# Patient Record
Sex: Female | Born: 2000 | Race: Black or African American | Hispanic: No | Marital: Single | Smoking: Never smoker
Health system: Southern US, Community
[De-identification: ages and names within clinical notes are randomized; demographics above are authoritative.]

## PROBLEM LIST (undated history)

## (undated) ENCOUNTER — Inpatient Hospital Stay (HOSPITAL_COMMUNITY): Payer: Self-pay

## (undated) DIAGNOSIS — D696 Thrombocytopenia, unspecified: Secondary | ICD-10-CM

## (undated) DIAGNOSIS — Z789 Other specified health status: Secondary | ICD-10-CM

## (undated) DIAGNOSIS — D563 Thalassemia minor: Secondary | ICD-10-CM

## (undated) HISTORY — PX: FOREIGN BODY REMOVAL: SHX962

## (undated) HISTORY — DX: Thalassemia minor: D56.3

---
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
## 2018-07-19 ENCOUNTER — Ambulatory Visit (HOSPITAL_COMMUNITY)
Admission: EM | Admit: 2018-07-19 | Discharge: 2018-07-19 | Disposition: A | Discharge status: Home / Self Care | Payer: Self-pay | Attending: Family Medicine | Admitting: Family Medicine

## 2018-07-19 ENCOUNTER — Other Ambulatory Visit: Payer: Self-pay

## 2018-07-19 ENCOUNTER — Encounter (HOSPITAL_COMMUNITY): Payer: Self-pay

## 2018-07-19 DIAGNOSIS — N898 Other specified noninflammatory disorders of vagina: Secondary | ICD-10-CM

## 2018-07-19 LAB — POCT URINALYSIS DIP (DEVICE)
Bilirubin Urine: NEGATIVE
Glucose, UA: NEGATIVE mg/dL
Ketones, ur: NEGATIVE mg/dL
Nitrite: NEGATIVE
Protein, ur: 30 mg/dL — AB
Specific Gravity, Urine: >=1.03 (ref 1.005–1.030)
Urobilinogen, UA: 0.2 mg/dL (ref 0.0–1.0)
pH: 7 (ref 5.0–8.0)

## 2018-07-19 LAB — POCT PREGNANCY, URINE: Preg Test, Ur: NEGATIVE

## 2018-07-19 NOTE — ED Provider Notes (Signed)
[facility]    CSN: [id number] Arrival date & time: 07/19/18  1715     History   Chief Complaint Chief Complaint  Patient presents with  . Vaginal Discharge    HPI Ruth Solomon is a 18 y.o. female any further acute concern of vaginal malodor.  Patient states that she was 1 daily for cycle, cycle started yesterday, and that she has noticed malodor with this.  Patient denies abnormal bleeding pattern, pain, fever.  Patient denies burning/stinging with urination, urinary frequency, increased vaginal discharge, anogenital lesions, dyspareunia.  She does note history of gonorrhea, treated 4 years ago without sequela.  Patient is currently sexually active, not routinely using condoms.  Patient currently has Nexplanon in place: Due to replace this October.   History reviewed. No pertinent past medical history.  There are no active problems to display for this patient.   Past Surgical History:  Procedure Laterality Date  . FOREIGN BODY REMOVAL Left    sewing needle removed from big toe    OB History   No obstetric history on file.      Home Medications    Prior to Admission medications   Not on File    Family History Family History  Problem Relation Age of Onset  . Healthy Mother   . Healthy Father     Social History Social History   Tobacco Use  . Smoking status: Never Smoker  . Smokeless tobacco: Never Used  Substance Use Topics  . Alcohol use: Never    Frequency: Never  . Drug use: Yes    Types: Marijuana     Allergies   Patient has no known allergies.   Review of Systems Review of Systems as per HPI   Physical Exam Triage Vital Signs ED Triage Vitals  Enc Vitals Group     BP 07/19/18 1755 (!) 113/57     Pulse Rate 07/19/18 1755 67     Resp 07/19/18 1755 18     Temp 07/19/18 1755 97.9 F (36.6 C)     Temp Source 07/19/18 1755 Oral     SpO2 07/19/18 1755 100 %     Weight --      Height --      Head Circumference --    Peak Flow --      Pain Score 07/19/18 1750 0     Pain Loc --      Pain Edu? --      Excl. in GC? --    No data found.  Updated Vital Signs BP (!) 113/57 (BP Location: Left Arm)   Pulse 67   Temp 97.9 F (36.6 C) (Oral)   Resp 18   SpO2 100%   Visual Acuity Right Eye Distance:   Left Eye Distance:   Bilateral Distance:    Right Eye Near:   Left Eye Near:    Bilateral Near:     Physical Exam Constitutional:      General: She is not in acute distress.    Appearance: Normal appearance. She is normal weight.  HENT:     Head: Normocephalic and atraumatic.  Eyes:     Conjunctiva/sclera: Conjunctivae normal.     Pupils: Pupils are equal, round, and reactive to light.  Neck:     Musculoskeletal: Neck supple.  Cardiovascular:     Rate and Rhythm: Normal rate and regular rhythm.     Heart sounds: No murmur.  Pulmonary:     Effort: Pulmonary effort is normal.  No respiratory distress.     Breath sounds: Normal breath sounds. No wheezing.  Abdominal:     General: Abdomen is flat. Bowel sounds are normal. There is no distension.     Palpations: Abdomen is soft. There is no mass.     Tenderness: There is no abdominal tenderness. There is no right CVA tenderness, left CVA tenderness or guarding.  Genitourinary:    Comments: Patient declined Lymphadenopathy:     Cervical: No cervical adenopathy.  Skin:    General: Skin is warm.     Capillary Refill: Capillary refill takes less than 2 seconds.     Coloration: Skin is not jaundiced or pale.  Neurological:     General: No focal deficit present.     Mental Status: She is alert and oriented to person, place, and time.  Psychiatric:        Mood and Affect: Mood normal.        Thought Content: Thought content normal.      UC Treatments / Results  Labs (all labs ordered are listed, but only abnormal results are displayed) Labs Reviewed  POCT URINALYSIS DIP (DEVICE) - Abnormal; Notable for the following components:       Result Value   Hgb urine dipstick LARGE (*)    Protein, ur 30 (*)    Leukocytes,Ua SMALL (*)    All other components within normal limits  POC URINE PREG, ED  POCT PREGNANCY, URINE  CERVICOVAGINAL ANCILLARY ONLY    EKG None  Radiology No results found.  Procedures Procedures (including critical care time)  Medications Ordered in UC Medications - No data to display  Initial Impression / Assessment and Plan / UC Course  I have reviewed the triage vital signs and the nursing notes.  Pertinent labs & imaging results that were available during my care of the patient were reviewed by me and considered in my medical decision making (see chart for details).     18 year old presenting for acute concern of vaginal malodor X 1 day.  Patient denies history of BV, increase in vaginal discharge.  She states that her baseline.  Urine and exam unremarkable.  We will look for results of swab, and treat if indicated. Final Clinical Impressions(s) / UC Diagnoses   Final diagnoses:  Vaginal discharge     Discharge Instructions     Will wait for swab result before treating since you are not having significant/bothersome discharge. Stop using new body wash as this could be contributing. Follow up w/ your GYN in October for your Nexplanon removal/placement and wear condoms regularly to prevent STIs.    ED Prescriptions    None     Controlled Substance Prescriptions  Controlled Substance Registry consulted? Not Applicable   Shea EvansHall-Potvin, Brittany, [place]-C 07/19/18 1901

## 2018-07-19 NOTE — ED Triage Notes (Signed)
Patient presents to Urgent Care with complaints of odorous vaginal bleeding with her period since yesterday. Patient reports she has had a nexplanon for three years, menstrual bleeding has never had an odor like this in the past, pt admits to unprotected intercourse.

## 2018-07-19 NOTE — Discharge Instructions (Addendum)
Will wait for swab result before treating since you are not having significant/bothersome discharge. Stop using new body wash as this could be contributing. Follow up w/ your GYN in October for your Nexplanon removal/placement and wear condoms regularly to prevent STIs.

## 2018-07-20 LAB — CERVICOVAGINAL ANCILLARY ONLY
Bacterial vaginitis: NEGATIVE
Candida vaginitis: POSITIVE — AB
Chlamydia: NEGATIVE
Neisseria Gonorrhea: NEGATIVE
Trichomonas: NEGATIVE

## 2018-07-24 ENCOUNTER — Telehealth (HOSPITAL_COMMUNITY): Payer: Self-pay | Admitting: Emergency Medicine

## 2018-07-24 MED ORDER — FLUCONAZOLE 150 MG PO TABS: 150.0000 mg | ORAL_TABLET | Freq: Once | ORAL | 0 refills | Status: AC

## 2018-07-24 NOTE — Telephone Encounter (Signed)
Test for candida (yeast) was positive.  Prescription for fluconazole 150mg po now, repeat dose in 3d if needed, #2 no refills, sent to the pharmacy of record.  Recheck or followup with PCP for further evaluation if symptoms are not improving.    Patient contacted and made aware of all results, all questions answered.   

## 2018-08-30 ENCOUNTER — Encounter (HOSPITAL_COMMUNITY): Payer: Self-pay | Admitting: Emergency Medicine

## 2018-08-30 ENCOUNTER — Ambulatory Visit (HOSPITAL_COMMUNITY)
Admission: EM | Admit: 2018-08-30 | Discharge: 2018-08-30 | Disposition: A | Discharge status: Home / Self Care | Payer: Medicaid Other | Attending: Internal Medicine | Admitting: Internal Medicine

## 2018-08-30 ENCOUNTER — Other Ambulatory Visit: Payer: Self-pay

## 2018-08-30 DIAGNOSIS — N3001 Acute cystitis with hematuria: Secondary | ICD-10-CM | POA: Diagnosis present

## 2018-08-30 DIAGNOSIS — B373 Candidiasis of vulva and vagina: Secondary | ICD-10-CM | POA: Diagnosis present

## 2018-08-30 DIAGNOSIS — B3731 Acute candidiasis of vulva and vagina: Secondary | ICD-10-CM

## 2018-08-30 LAB — POCT URINALYSIS DIP (DEVICE)
Bilirubin Urine: NEGATIVE
Glucose, UA: NEGATIVE mg/dL
Ketones, ur: NEGATIVE mg/dL
Nitrite: NEGATIVE
Protein, ur: 100 mg/dL — AB
Specific Gravity, Urine: 1.025 (ref 1.005–1.030)
Urobilinogen, UA: 1 mg/dL (ref 0.0–1.0)
pH: 7 (ref 5.0–8.0)

## 2018-08-30 MED ORDER — CIPROFLOXACIN HCL 500 MG PO TABS: 500.0000 mg | ORAL_TABLET | Freq: Two times a day (BID) | ORAL | 0 refills | Status: AC

## 2018-08-30 MED ORDER — FLUCONAZOLE 200 MG PO TABS: ORAL_TABLET | ORAL | 0 refills | Status: DC

## 2018-08-30 NOTE — ED Provider Notes (Signed)
[facility]    CSN: [id number] Arrival date & time: 08/30/18  1637     History   Chief Complaint Chief Complaint  Patient presents with  . Vaginitis    HPI [name] is a 18 y.o. female no past medical history comes to urgent care with complaints of vaginal discomfort, mild discharge and frequency of micturition.  Symptoms started yesterday.  Patient denies any itching in the perineum or vaginal area.  She denies any flank pain.  She is not sure if she has dysuria but complains about having some urgency.  No relieving factors.  No flank pain.  No nausea vomiting.  Patient completed her menstrual period last week.  Denies any dyspareunia.   HPI  History reviewed. No pertinent past medical history.  There are no active problems to display for this patient.   Past Surgical History:  Procedure Laterality Date  . FOREIGN BODY REMOVAL Left    sewing needle removed from big toe    OB History   No obstetric history on file.      Home Medications    Prior to Admission medications   Medication Sig Start Date End Date Taking? Authorizing Provider  ciprofloxacin (CIPRO) 500 MG tablet Take 1 tablet (500 mg total) by mouth every 12 (twelve) hours for 3 days. 08/30/18 09/02/18  Merrilee JanskyLamptey, Anterio Scheel O, MD  fluconazole (DIFLUCAN) 200 MG tablet Please take 1 tablet now and repeat in 72 hours if no improvement 08/30/18   Savaughn Karwowski, Britta MccreedyPhilip O, MD    Family History Family History  Problem Relation Age of Onset  . Healthy Mother   . Healthy Father     Social History Social History   Tobacco Use  . Smoking status: Never Smoker  . Smokeless tobacco: Never Used  Substance Use Topics  . Alcohol use: Never    Frequency: Never  . Drug use: Yes    Types: Marijuana     Allergies   Patient has no known allergies.   Review of Systems Review of Systems  Constitutional: Negative for activity change, appetite change, chills, fatigue and fever.  HENT: Negative.    Respiratory: Negative.   Cardiovascular: Negative.   Gastrointestinal: Negative for abdominal distention, abdominal pain, constipation, diarrhea, nausea and vomiting.  Genitourinary: Positive for frequency, urgency and vaginal discharge. Negative for dyspareunia, genital sores, menstrual problem and vaginal pain.  Neurological: Negative.      Physical Exam Triage Vital Signs ED Triage Vitals  Enc Vitals Group     BP      Pulse      Resp      Temp      Temp src      SpO2      Weight      Height      Head Circumference      Peak Flow      Pain Score      Pain Loc      Pain Edu?      Excl. in GC?    No data found.  Updated Vital Signs BP 98/62 (BP Location: Left Arm)   Pulse 75   Temp 97.9 F (36.6 C) (Oral)   Resp 12   SpO2 97%   Visual Acuity Right Eye Distance:   Left Eye Distance:   Bilateral Distance:    Right Eye Near:   Left Eye Near:    Bilateral Near:     Physical Exam Constitutional:      Appearance: Normal  appearance.  Cardiovascular:     Rate and Rhythm: Normal rate and regular rhythm.     Pulses: Normal pulses.     Heart sounds: Normal heart sounds.  Pulmonary:     Effort: Pulmonary effort is normal.     Breath sounds: Normal breath sounds.  Skin:    Capillary Refill: Capillary refill takes less than 2 seconds.  Neurological:     General: No focal deficit present.     Mental Status: She is alert and oriented to person, place, and time.      UC Treatments / Results  Labs (all labs ordered are listed, but only abnormal results are displayed) Labs Reviewed  POCT URINALYSIS DIP (DEVICE) - Abnormal; Notable for the following components:      Result Value   Hgb urine dipstick MODERATE (*)    Protein, ur 100 (*)    Leukocytes,Ua SMALL (*)    All other components within normal limits  URINE CULTURE    EKG   Radiology No results found.  Procedures Procedures (including critical care time)  Medications Ordered in UC Medications  - No data to display  Initial Impression / Assessment and Plan / UC Course  I have reviewed the triage vital signs and the nursing notes.  Pertinent labs & imaging results that were available during my care of the patient were reviewed by me and considered in my medical decision making (see chart for details).    1.  Urinary tract infection: Urine culture sent Ciprofloxacin 500 grams twice daily for 3 days Fluconazole 200 mg now and repeat in 72 hours Urine culture sent If patient symptoms worsen or if she starts having fever, chills and or flank pain she needs to come back to urgent care to be reevaluated. Final Clinical Impressions(s) / UC Diagnoses   Final diagnoses:  Vaginal yeast infection  Acute cystitis with hematuria   Discharge Instructions   None    ED Prescriptions    Medication Sig Dispense Auth. Provider   fluconazole (DIFLUCAN) 200 MG tablet Please take 1 tablet now and repeat in 72 hours if no improvement 2 tablet Jannetta Massey, Myrene Galas, MD   ciprofloxacin (CIPRO) 500 MG tablet Take 1 tablet (500 mg total) by mouth every 12 (twelve) hours for 3 days. 6 tablet Smt Lokey, Myrene Galas, MD     Controlled Substance Prescriptions [place] Controlled Substance Registry consulted? No   Chase Picket, MD 08/30/18 [phone]

## 2018-08-30 NOTE — ED Triage Notes (Signed)
Pt reports some vaginal discomfort with mild discharge and vaginal odor.  She states this is what she usually feels like when she gets a yeast infection.

## 2018-09-01 LAB — URINE CULTURE: Culture: >=100000 — AB

## 2018-09-03 ENCOUNTER — Telehealth (HOSPITAL_COMMUNITY): Payer: Self-pay | Admitting: Emergency Medicine

## 2018-09-03 NOTE — Telephone Encounter (Signed)
Urine culture was positive for proteus mirabilis and was given cipro  at urgent care visit. Pt contacted and made aware, educated on completing antibiotic and to follow up if symptoms are persistent. Verbalized understanding.    

## 2018-09-24 ENCOUNTER — Ambulatory Visit (HOSPITAL_COMMUNITY)
Admission: EM | Admit: 2018-09-24 | Discharge: 2018-09-24 | Disposition: A | Discharge status: Home / Self Care | Payer: Medicaid Other | Attending: Internal Medicine | Admitting: Internal Medicine

## 2018-09-24 ENCOUNTER — Other Ambulatory Visit: Payer: Self-pay

## 2018-09-24 ENCOUNTER — Encounter (HOSPITAL_COMMUNITY): Payer: Self-pay

## 2018-09-24 DIAGNOSIS — K5901 Slow transit constipation: Secondary | ICD-10-CM

## 2018-09-24 MED ORDER — SENNA 8.6 MG PO TABS: 1.0000 | ORAL_TABLET | Freq: Two times a day (BID) | ORAL | 0 refills | Status: DC

## 2018-09-24 MED ORDER — POLYETHYLENE GLYCOL 3350 17 G PO PACK: 17.0000 g | PACK | Freq: Every day | ORAL | 0 refills | Status: DC | PRN

## 2018-09-24 NOTE — ED Provider Notes (Signed)
[place]    CSN: [id number] Arrival date & time: 09/24/18  1805     History   Chief Complaint Chief Complaint  Patient presents with  . Constipation    HPI Ruth Solomon is a 18 y.o. female with a history of constipation not taking any medication at this time comes to urgent care with complaints of constipation of at least 4 days duration.  Patient denies any abdominal pain or distention.  No nausea or vomiting.  Patient is been using MiraLAX over the past couple of days with no relief.  Patient eats a healthy diet.  She eats fruits daily.  She drinks a lot of fluids daily.  No chronic medications. HPI  History reviewed. No pertinent past medical history.  There are no active problems to display for this patient.   Past Surgical History:  Procedure Laterality Date  . FOREIGN BODY REMOVAL Left    sewing needle removed from big toe    OB History   No obstetric history on file.      Home Medications    Prior to Admission medications   Medication Sig Start Date End Date Taking? Authorizing Provider  fluconazole (DIFLUCAN) 200 MG tablet Please take 1 tablet now and repeat in 72 hours if no improvement 08/30/18   Travone Georg, Myrene Galas, MD  polyethylene glycol (MIRALAX) 17 g packet Take 17 g by mouth daily as needed for mild constipation (if no bowel movement in 2 days). 09/24/18   LampteyMyrene Galas, MD  senna (SENOKOT) 8.6 MG TABS tablet Take 1 tablet (8.6 mg total) by mouth 2 (two) times a day. 09/24/18   LampteyMyrene Galas, MD    Family History Family History  Problem Relation Age of Onset  . Healthy Mother   . Healthy Father     Social History Social History   Tobacco Use  . Smoking status: Never Smoker  . Smokeless tobacco: Never Used  Substance Use Topics  . Alcohol use: Never    Frequency: Never  . Drug use: Yes    Types: Marijuana     Allergies   Patient has no known allergies.   Review of Systems Review of Systems  Constitutional:  Negative.   HENT: Negative.   Respiratory: Negative.   Cardiovascular: Negative.   Gastrointestinal: Positive for constipation. Negative for abdominal pain, diarrhea, nausea, rectal pain and vomiting.  Genitourinary: Negative for dysuria, frequency, pelvic pain and urgency.  Musculoskeletal: Negative.   Skin: Negative.      Physical Exam Triage Vital Signs ED Triage Vitals  Enc Vitals Group     BP 09/24/18 1831 111/73     Pulse Rate 09/24/18 1831 84     Resp 09/24/18 1831 16     Temp 09/24/18 1831 98.2 F (36.8 C)     Temp Source 09/24/18 1831 Oral     SpO2 09/24/18 1831 98 %     Weight --      Height --      Head Circumference --      Peak Flow --      Pain Score 09/24/18 1832 4     Pain Loc --      Pain Edu? --      Excl. in [place]? --    No data found.  Updated Vital Signs BP 111/73 (BP Location: Right Arm)   Pulse 84   Temp 98.2 F (36.8 C) (Oral)   Resp 16   LMP 09/13/2018   SpO2 98%  Visual Acuity Right Eye Distance:   Left Eye Distance:   Bilateral Distance:    Right Eye Near:   Left Eye Near:    Bilateral Near:     Physical Exam Vitals signs and nursing note reviewed.  Constitutional:      Appearance: Normal appearance. She is not ill-appearing or toxic-appearing.  Cardiovascular:     Rate and Rhythm: Normal rate and regular rhythm.     Pulses: Normal pulses.     Heart sounds: Normal heart sounds.  Pulmonary:     Effort: Pulmonary effort is normal.     Breath sounds: Normal breath sounds.  Abdominal:     General: Bowel sounds are normal. There is no distension.     Palpations: Abdomen is soft.     Tenderness: There is no abdominal tenderness. There is no guarding.  Skin:    Capillary Refill: Capillary refill takes less than 2 seconds.  Neurological:     General: No focal deficit present.     Mental Status: She is alert.      UC Treatments / Results  Labs (all labs ordered are listed, but only abnormal results are displayed) Labs  Reviewed - No data to display  EKG   Radiology No results found.  Procedures Procedures (including critical care time)  Medications Ordered in UC Medications - No data to display  Initial Impression / Assessment and Plan / UC Course  I have reviewed the triage vital signs and the nursing notes.  Pertinent labs & imaging results that were available during my care of the patient were reviewed by me and considered in my medical decision making (see chart for details).     1.  Constipation: I encouraged patient to continue with the liberal fluid intake and fluid intake Senokot 1 tablet twice daily MiraLAX 17 g daily if no bowel movement in a couple of days If patient experiences worsening abdominal pain, distention or nausea/vomiting she needs to go to emergency department or return to urgent care to be reevaluated. Final Clinical Impressions(s) / UC Diagnoses   Final diagnoses:  Slow transit constipation   Discharge Instructions   None    ED Prescriptions    Medication Sig Dispense Auth. Provider   senna (SENOKOT) 8.6 MG TABS tablet Take 1 tablet (8.6 mg total) by mouth 2 (two) times a day. 120 tablet Saveah Bahar, Britta MccreedyPhilip O, MD   polyethylene glycol (MIRALAX) 17 g packet Take 17 g by mouth daily as needed for mild constipation (if no bowel movement in 2 days). 14 each Joshiah Traynham, Britta MccreedyPhilip O, MD     Controlled Substance Prescriptions [place] Controlled Substance Registry consulted? No   Merrilee JanskyLamptey, Suzzanne Brunkhorst O, MD 09/24/18 Ebony [id number]

## 2018-09-24 NOTE — ED Triage Notes (Signed)
Pt present constipation, symptoms started 3 days ago. Pt have tried OTC medication with no relief.

## 2018-10-15 ENCOUNTER — Inpatient Hospital Stay (HOSPITAL_COMMUNITY)
Admission: EM | Admit: 2018-10-15 | Discharge: 2018-10-16 | Disposition: A | Discharge status: Home / Self Care | Payer: Medicaid Other | Attending: Obstetrics and Gynecology | Admitting: Obstetrics and Gynecology

## 2018-10-15 ENCOUNTER — Other Ambulatory Visit: Payer: Self-pay

## 2018-10-15 ENCOUNTER — Encounter (HOSPITAL_COMMUNITY): Payer: Self-pay | Admitting: *Deleted

## 2018-10-15 DIAGNOSIS — Z3A01 Less than 8 weeks gestation of pregnancy: Secondary | ICD-10-CM | POA: Diagnosis not present

## 2018-10-15 DIAGNOSIS — O26891 Other specified pregnancy related conditions, first trimester: Secondary | ICD-10-CM | POA: Insufficient documentation

## 2018-10-15 DIAGNOSIS — Z3491 Encounter for supervision of normal pregnancy, unspecified, first trimester: Secondary | ICD-10-CM | POA: Diagnosis not present

## 2018-10-15 DIAGNOSIS — R109 Unspecified abdominal pain: Secondary | ICD-10-CM | POA: Diagnosis not present

## 2018-10-15 DIAGNOSIS — Z79899 Other long term (current) drug therapy: Secondary | ICD-10-CM | POA: Diagnosis not present

## 2018-10-15 LAB — URINALYSIS, ROUTINE W REFLEX MICROSCOPIC
Bilirubin Urine: NEGATIVE
Glucose, UA: NEGATIVE mg/dL
Hgb urine dipstick: NEGATIVE
Ketones, ur: 80 mg/dL — AB
Leukocytes,Ua: NEGATIVE
Nitrite: NEGATIVE
Protein, ur: NEGATIVE mg/dL
Specific Gravity, Urine: 1.028 (ref 1.005–1.030)
pH: 5 (ref 5.0–8.0)

## 2018-10-15 LAB — COMPREHENSIVE METABOLIC PANEL
ALT: 9 U/L (ref 0–44)
AST: 16 U/L (ref 15–41)
Albumin: 3.9 g/dL (ref 3.5–5.0)
Alkaline Phosphatase: 58 U/L (ref 38–126)
Anion gap: 9 (ref 5–15)
BUN: 8 mg/dL (ref 6–20)
CO2: 22 mmol/L (ref 22–32)
Calcium: 9.5 mg/dL (ref 8.9–10.3)
Chloride: 104 mmol/L (ref 98–111)
Creatinine, Ser: 0.8 mg/dL (ref 0.44–1.00)
GFR calc Af Amer: >60 mL/min (ref >60)
GFR calc non Af Amer: >60 mL/min (ref >60)
Glucose, Bld: 92 mg/dL (ref 70–99)
Potassium: 3.8 mmol/L (ref 3.5–5.1)
Sodium: 135 mmol/L (ref 135–145)
Total Bilirubin: 0.7 mg/dL (ref 0.3–1.2)
Total Protein: 6.8 g/dL (ref 6.5–8.1)

## 2018-10-15 LAB — CBC
HCT: 40.8 % (ref 36.0–46.0)
Hemoglobin: 13.2 g/dL (ref 12.0–15.0)
MCH: 27.2 pg (ref 26.0–34.0)
MCHC: 32.4 g/dL (ref 30.0–36.0)
MCV: 84.1 fL (ref 80.0–100.0)
Platelets: 109 10*3/uL — ABNORMAL LOW (ref 150–400)
RBC: 4.85 MIL/uL (ref 3.87–5.11)
RDW: 12.2 % (ref 11.5–15.5)
WBC: 8.7 10*3/uL (ref 4.0–10.5)
nRBC: 0 % (ref 0.0–0.2)

## 2018-10-15 LAB — I-STAT BETA HCG BLOOD, ED (NOT ORDERABLE): I-stat hCG, quantitative: >2000 m[IU]/mL — ABNORMAL HIGH (ref <5)

## 2018-10-15 LAB — LIPASE, BLOOD: Lipase: 23 U/L (ref 11–51)

## 2018-10-15 MED ORDER — SODIUM CHLORIDE 0.9% FLUSH: 3.0000 mL | Freq: Once | INTRAVENOUS | Status: DC

## 2018-10-15 NOTE — MAU Provider Note (Signed)
Chief Complaint: Abdominal Pain   First Provider Initiated Contact with Patient 10/15/18 2337     SUBJECTIVE HPI: Ruth Solomon is a 18 y.o. G1P0 at [redacted]w[redacted]d who presents to M[facility] Admissions reporting abdominal cramping. Symptoms started 2 days ago. Reports abdominal pain from mid abdomen down. Denies n/v/d, constipation, dysuria, vaginal bleeding, or vaginal discharge. Has appt scheduled to start care with [facility].   Location: abdomen Quality: cramping Severity: 6/10 on pain scale Duration: 2 days Timing: intermittent Modifying factors: none Associated signs and symptoms: none  History reviewed. No pertinent past medical history. OB History  Gravida Para Term Preterm AB Living  1            SAB TAB Ectopic Multiple Live Births               # Outcome Date GA Lbr Len/2nd Weight Sex Delivery Anes PTL Lv  1 Current            Past Surgical History:  Procedure Laterality Date  . FOREIGN BODY REMOVAL Left    sewing needle removed from big toe   Social History   Socioeconomic History  . Marital status: Single    Spouse name: Not on file  . Number of children: Not on file  . Years of education: Not on file  . Highest education level: Not on file  Occupational History  . Not on file  Social Needs  . Financial resource strain: Not on file  . Food insecurity    Worry: Not on file    Inability: Not on file  . Transportation needs    Medical: Not on file    Non-medical: Not on file  Tobacco Use  . Smoking status: Never Smoker  . Smokeless tobacco: Never Used  Substance and Sexual Activity  . Alcohol use: Never    Frequency: Never  . Drug use: Yes    Types: Marijuana  . Sexual activity: Yes  Lifestyle  . Physical activity    Days per week: Not on file    Minutes per session: Not on file  . Stress: Not on file  Relationships  . Social Herbalist on phone: Not on file    Gets together: Not on file    Attends religious service: Not on file    Active  member of club or organization: Not on file    Attends meetings of clubs or organizations: Not on file    Relationship status: Not on file  . Intimate partner violence    Fear of current or ex partner: Not on file    Emotionally abused: Not on file    Physically abused: Not on file    Forced sexual activity: Not on file  Other Topics Concern  . Not on file  Social History Narrative  . Not on file   Family History  Problem Relation Age of Onset  . Healthy Mother   . Healthy Father    No current facility-administered medications on file prior to encounter.    No current outpatient medications on file prior to encounter.   No Known Allergies  I have reviewed patient's Past Medical Hx, Surgical Hx, Family Hx, Social Hx, medications and allergies.   Review of Systems  Constitutional: Negative.   Gastrointestinal: Positive for abdominal pain. Negative for constipation, diarrhea, nausea and vomiting.  Genitourinary: Negative.     OBJECTIVE Patient Vitals for the past 24 hrs:  BP Temp Temp src Pulse Resp SpO2  10/16/18  0142 108/61 98.2 F (36.8 C) Oral 69 16 100 %  10/15/18 2318 114/70 - - 74 - -  10/15/18 2310 113/74 98.5 F (36.9 C) Oral 74 17 100 %  10/15/18 2200 114/65 99 F (37.2 C) Oral 83 12 100 %   Constitutional: Well-developed, well-nourished female in no acute distress.  Cardiovascular: normal rate & rhythm, no murmur Respiratory: normal rate and effort. Lung sounds clear throughout GI: Abd soft, non-tender, Pos BS x 4. No guarding or rebound tenderness MS: Extremities nontender, no edema, normal ROM Neurologic: Alert and oriented x 4.     LAB RESULTS Results for orders placed or performed during the hospital encounter of 10/15/18 (from the past 24 hour(s))  Urinalysis, Routine w reflex microscopic     Status: Abnormal   Collection Time: 10/15/18 10:30 PM  Result Value Ref Range   Color, Urine YELLOW YELLOW   APPearance HAZY (A) CLEAR   Specific Gravity,  Urine 1.028 1.005 - 1.030   pH 5.0 5.0 - 8.0   Glucose, UA NEGATIVE NEGATIVE mg/dL   Hgb urine dipstick NEGATIVE NEGATIVE   Bilirubin Urine NEGATIVE NEGATIVE   Ketones, ur 80 (A) NEGATIVE mg/dL   Protein, ur NEGATIVE NEGATIVE mg/dL   Nitrite NEGATIVE NEGATIVE   Leukocytes,Ua NEGATIVE NEGATIVE  Lipase, blood     Status: None   Collection Time: 10/15/18 10:34 PM  Result Value Ref Range   Lipase 23 11 - 51 U/L  Comprehensive metabolic panel     Status: None   Collection Time: 10/15/18 10:34 PM  Result Value Ref Range   Sodium 135 135 - 145 mmol/L   Potassium 3.8 3.5 - 5.1 mmol/L   Chloride 104 98 - 111 mmol/L   CO2 22 22 - 32 mmol/L   Glucose, Bld 92 70 - 99 mg/dL   BUN 8 6 - 20 mg/dL   Creatinine, Ser 1.610.80 0.44 - 1.00 mg/dL   Calcium 9.5 8.9 - 09.610.3 mg/dL   Total Protein 6.8 6.5 - 8.1 g/dL   Albumin 3.9 3.5 - 5.0 g/dL   AST 16 15 - 41 U/L   ALT 9 0 - 44 U/L   Alkaline Phosphatase 58 38 - 126 U/L   Total Bilirubin 0.7 0.3 - 1.2 mg/dL   GFR calc non Af Amer >60 >60 mL/min   GFR calc Af Amer >60 >60 mL/min   Anion gap 9 5 - 15  CBC     Status: Abnormal   Collection Time: 10/15/18 10:34 PM  Result Value Ref Range   WBC 8.7 4.0 - 10.5 K/uL   RBC 4.85 3.87 - 5.11 MIL/uL   Hemoglobin 13.2 12.0 - 15.0 g/dL   HCT 04.540.8 40.936.0 - 81.146.0 %   MCV 84.1 80.0 - 100.0 fL   MCH 27.2 26.0 - 34.0 pg   MCHC 32.4 30.0 - 36.0 g/dL   RDW 91.412.2 78.211.5 - 95.615.5 %   Platelets 109 (L) 150 - 400 K/uL   nRBC 0.0 0.0 - 0.2 %  I-Stat beta hCG blood, ED     Status: Abnormal   Collection Time: 10/15/18 10:44 PM  Result Value Ref Range   I-stat hCG, quantitative >2,000.0 (H) <5 mIU/mL   Comment 3          hCG, quantitative, pregnancy     Status: Abnormal   Collection Time: 10/16/18 12:00 AM  Result Value Ref Range   hCG, Beta Chain, Quant, S 26,567 (H) <5 mIU/mL  ABO/Rh  Status: None   Collection Time: 10/16/18 12:00 AM  Result Value Ref Range   ABO/RH(D) O POS    No rh immune globuloin      NOT A RH  IMMUNE GLOBULIN CANDIDATE, PT RH POSITIVE Performed at [facility], [address] [place], [place] [id number]     IMAGING [place] Ob Less Than 14 Weeks With Ob Transvaginal  Result Date: 10/16/2018 CLINICAL DATA:  Initial evaluation for acute abdominal pain, early pregnancy. EXAM: OBSTETRIC <14 WK US AND TRANSVAGINAL OB US TECHNIQUE: Both transabdominal and transvaginal ultrasound examinations were performed for complete evaluation of the gestation as well as the maternal uterus, adnexal regions, and pelvic cul-de-sac. Transvaginal technique was performed to assess early pregnancy. COMPARISON:  None. FINDINGS: Intrauterine gestational sac: Single Yolk sac:  Present Embryo:  Not visualized. Cardiac Activity: Not visualized. Heart Rate: N/A  bpm MSD: 11.6 mm   5 w   6 d Subchorionic hemorrhage:  None visualized. Maternal uterus/adnexae: Ovaries are normal in appearance bilaterally. 2.2 cm corpus luteal cyst noted within the right ovary. No free fluid within the pelvis. IMPRESSION: 1. Early intrauterine gestational sac with internal yolk sac, but no fetal pole or cardiac activity yet visualized. Recommend follow-up quantitative B-HCG levels and follow-up US in 14 days to confirm and assess viability. 2. No other acute maternal uterine or adnexal abnormality identified. Electronically Signed   By: Rise MuBenjamin  McClintock M.D.   On: 10/16/2018 01:22    MAU COURSE Orders Placed This Encounter  Procedures  . US OB LESS THAN 14 WEEKS WITH OB TRANSVAGINAL  . Lipase, blood  . Comprehensive metabolic panel  . CBC  . Urinalysis, Routine w reflex microscopic  . hCG, quantitative, pregnancy  . Diet NPO time specified  . I-Stat beta hCG blood, ED  . I-Stat beta hCG blood, ED  . ABO/Rh  . Discharge patient   Meds ordered this encounter  Medications  . DISCONTD: sodium chloride flush (NS) 0.9 % injection 3 mL    MDM +UPT UA, wet prep, GC/chlamydia, CBC, ABO/Rh, quant hCG, and US today to rule out  ectopic pregnancy  Ultrasound shows IUGS with yolk sac  ASSESSMENT 1. Normal IUP (intrauterine pregnancy) on prenatal ultrasound, first trimester   2. Abdominal pain during pregnancy in first trimester     PLAN Discharge home in stable condition. SAB precautions Start prenatal care  Follow-up Information    [facility] Follow up.   Specialty: Obstetrics and Gynecology Why: Call office to schedule new ob appointment Contact information: [address], [address] [place] [id number] [phone]       [facility] Follow up.   Specialty: Obstetrics and Gynecology Why: return for worsening symptoms Contact information: [address] [id number] [place] [id number] [phone]         Allergies as of 10/16/2018   No Known Allergies     Medication List    STOP taking these medications   fluconazole 200 MG tablet Commonly known as: DIFLUCAN   polyethylene glycol 17 g packet Commonly known as: MiraLax   senna 8.6 MG Tabs tablet Commonly known as: Marliss CootsSENOKOT        Sharley Keeler, NP 10/16/2018  4:52 AM

## 2018-10-15 NOTE — MAU Note (Addendum)
Patient presents to MAU from [facility] ED c/o generalized abdominal pain/ intense cramping x 2 days.  Patient reports movement helps. Has not taken anything for pain. + preg test at home on 8/12.  Denies vaginal bleeding and discharge.

## 2018-10-16 ENCOUNTER — Inpatient Hospital Stay (HOSPITAL_COMMUNITY): Payer: Medicaid Other

## 2018-10-16 LAB — HCG, QUANTITATIVE, PREGNANCY: hCG, Beta Chain, Quant, S: 26567 m[IU]/mL — ABNORMAL HIGH (ref <5)

## 2018-10-16 LAB — ABO/RH: ABO/RH(D): O POS

## 2018-10-16 NOTE — Discharge Instructions (Signed)
First Trimester of Pregnancy The first trimester of pregnancy is from week 1 until the end of week 13 (months 1 through 3). A week after a sperm fertilizes an egg, the egg will implant on the wall of the uterus. This embryo will begin to develop into a baby. Genes from you and your partner will form the baby. The female genes will determine whether the baby will be a boy or a girl. At 6-8 weeks, the eyes and face will be formed, and the heartbeat can be seen on ultrasound. At the end of 12 weeks, all the baby's organs will be formed. Now that you are pregnant, you will want to do everything you can to have a healthy baby. Two of the most important things are to get good prenatal care and to follow your health care provider's instructions. Prenatal care is all the medical care you receive before the baby's birth. This care will help prevent, find, and treat any problems during the pregnancy and childbirth. Body changes during your first trimester Your body goes through many changes during pregnancy. The changes vary from woman to woman.  You may gain or lose a couple of pounds at first.  You may feel sick to your stomach (nauseous) and you may throw up (vomit). If the vomiting is uncontrollable, call your health care provider.  You may tire easily.  You may develop headaches that can be relieved by medicines. All medicines should be approved by your health care provider.  You may urinate more often. Painful urination may mean you have a bladder infection.  You may develop heartburn as a result of your pregnancy.  You may develop constipation because certain hormones are causing the muscles that push stool through your intestines to slow down.  You may develop hemorrhoids or swollen veins (varicose veins).  Your breasts may begin to grow larger and become tender. Your nipples may stick out more, and the tissue that surrounds them (areola) may become darker.  Your gums may bleed and may be  sensitive to brushing and flossing.  Dark spots or blotches (chloasma, mask of pregnancy) may develop on your face. This will likely fade after the baby is born.  Your menstrual periods will stop.  You may have a loss of appetite.  You may develop cravings for certain kinds of food.  You may have changes in your emotions from day to day, such as being excited to be pregnant or being concerned that something may go wrong with the pregnancy and baby.  You may have more vivid and strange dreams.  You may have changes in your hair. These can include thickening of your hair, rapid growth, and changes in texture. Some women also have hair loss during or after pregnancy, or hair that feels dry or thin. Your hair will most likely return to normal after your baby is born. What to expect at prenatal visits During a routine prenatal visit:  You will be weighed to make sure you and the baby are growing normally.  Your blood pressure will be taken.  Your abdomen will be measured to track your baby's growth.  The fetal heartbeat will be listened to between weeks 10 and 14 of your pregnancy.  Test results from any previous visits will be discussed. Your health care provider may ask you:  How you are feeling.  If you are feeling the baby move.  If you have had any abnormal symptoms, such as leaking fluid, bleeding, severe headaches, or abdominal   cramping.  If you are using any tobacco products, including cigarettes, chewing tobacco, and electronic cigarettes.  If you have any questions. Other tests that may be performed during your first trimester include:  Blood tests to find your blood type and to check for the presence of any previous infections. The tests will also be used to check for low iron levels (anemia) and protein on red blood cells (Rh antibodies). Depending on your risk factors, or if you previously had diabetes during pregnancy, you may have tests to check for high blood sugar  that affects pregnant women (gestational diabetes).  Urine tests to check for infections, diabetes, or protein in the urine.  An ultrasound to confirm the proper growth and development of the baby.  Fetal screens for spinal cord problems (spina bifida) and Down syndrome.  HIV (human immunodeficiency virus) testing. Routine prenatal testing includes screening for HIV, unless you choose not to have this test.  You may need other tests to make sure you and the baby are doing well. Follow these instructions at home: Medicines  Follow your health care provider's instructions regarding medicine use. Specific medicines may be either safe or unsafe to take during pregnancy.  Take a prenatal vitamin that contains at least 600 micrograms (mcg) of folic acid.  If you develop constipation, try taking a stool softener if your health care provider approves. Eating and drinking   Eat a balanced diet that includes fresh fruits and vegetables, whole grains, good sources of protein such as meat, eggs, or tofu, and low-fat dairy. Your health care provider will help you determine the amount of weight gain that is right for you.  Avoid raw meat and uncooked cheese. These carry germs that can cause birth defects in the baby.  Eating four or five small meals rather than three large meals a day may help relieve nausea and vomiting. If you start to feel nauseous, eating a few soda crackers can be helpful. Drinking liquids between meals, instead of during meals, also seems to help ease nausea and vomiting.  Limit foods that are high in fat and processed sugars, such as fried and sweet foods.  To prevent constipation: ? Eat foods that are high in fiber, such as fresh fruits and vegetables, whole grains, and beans. ? Drink enough fluid to keep your urine clear or pale yellow. Activity  Exercise only as directed by your health care provider. Most women can continue their usual exercise routine during  pregnancy. Try to exercise for 30 minutes at least 5 days a week. Exercising will help you: ? Control your weight. ? Stay in shape. ? Be prepared for labor and delivery.  Experiencing pain or cramping in the lower abdomen or lower back is a good sign that you should stop exercising. Check with your health care provider before continuing with normal exercises.  Try to avoid standing for long periods of time. Move your legs often if you must stand in one place for a long time.  Avoid heavy lifting.  Wear low-heeled shoes and practice good posture.  You may continue to have sex unless your health care provider tells you not to. Relieving pain and discomfort  Wear a good support bra to relieve breast tenderness.  Take warm sitz baths to soothe any pain or discomfort caused by hemorrhoids. Use hemorrhoid cream if your health care provider approves.  Rest with your legs elevated if you have leg cramps or low back pain.  If you develop varicose veins in   your legs, wear support hose. Elevate your feet for 15 minutes, 3-4 times a day. Limit salt in your diet. Prenatal care  Schedule your prenatal visits by the twelfth week of pregnancy. They are usually scheduled monthly at first, then more often in the last 2 months before delivery.  Write down your questions. Take them to your prenatal visits.  Keep all your prenatal visits as told by your health care provider. This is important. Safety  Wear your seat belt at all times when driving.  Make a list of emergency phone numbers, including numbers for family, friends, the hospital, and police and fire departments. General instructions  Ask your health care provider for a referral to a local prenatal education class. Begin classes no later than the beginning of month 6 of your pregnancy.  Ask for help if you have counseling or nutritional needs during pregnancy. Your health care provider can offer advice or refer you to specialists for help  with various needs.  Do not use hot tubs, steam rooms, or saunas.  Do not douche or use tampons or scented sanitary pads.  Do not cross your legs for long periods of time.  Avoid cat litter boxes and soil used by cats. These carry germs that can cause birth defects in the baby and possibly loss of the fetus by miscarriage or stillbirth.  Avoid all smoking, herbs, alcohol, and medicines not prescribed by your health care provider. Chemicals in these products affect the formation and growth of the baby.  Do not use any products that contain nicotine or tobacco, such as cigarettes and e-cigarettes. If you need help quitting, ask your health care provider. You may receive counseling support and other resources to help you quit.  Schedule a dentist appointment. At home, brush your teeth with a soft toothbrush and be gentle when you floss. Contact a health care provider if:  You have dizziness.  You have mild pelvic cramps, pelvic pressure, or nagging pain in the abdominal area.  You have persistent nausea, vomiting, or diarrhea.  You have a bad smelling vaginal discharge.  You have pain when you urinate.  You notice increased swelling in your face, hands, legs, or ankles.  You are exposed to fifth disease or chickenpox.  You are exposed to German measles (rubella) and have never had it. Get help right away if:  You have a fever.  You are leaking fluid from your vagina.  You have spotting or bleeding from your vagina.  You have severe abdominal cramping or pain.  You have rapid weight gain or loss.  You vomit blood or material that looks like coffee grounds.  You develop a severe headache.  You have shortness of breath.  You have any kind of trauma, such as from a fall or a car accident. Summary  The first trimester of pregnancy is from week 1 until the end of week 13 (months 1 through 3).  Your body goes through many changes during pregnancy. The changes vary from  woman to woman.  You will have routine prenatal visits. During those visits, your health care provider will examine you, discuss any test results you may have, and talk with you about how you are feeling. This information is not intended to replace advice given to you by your health care provider. Make sure you discuss any questions you have with your health care provider. Document Released: 02/08/2001 Document Revised: 01/27/2017 Document Reviewed: 01/27/2016 Elsevier Patient Education  2020 [employer]  

## 2018-10-22 ENCOUNTER — Inpatient Hospital Stay (HOSPITAL_COMMUNITY)
Admission: AD | Admit: 2018-10-22 | Discharge: 2018-10-23 | Disposition: A | Discharge status: Home / Self Care | Payer: Medicaid Other | Attending: Obstetrics & Gynecology | Admitting: Obstetrics & Gynecology

## 2018-10-22 ENCOUNTER — Other Ambulatory Visit: Payer: Self-pay

## 2018-10-22 ENCOUNTER — Encounter (HOSPITAL_COMMUNITY): Payer: Self-pay | Admitting: *Deleted

## 2018-10-22 DIAGNOSIS — O219 Vomiting of pregnancy, unspecified: Secondary | ICD-10-CM | POA: Insufficient documentation

## 2018-10-22 DIAGNOSIS — Z3A01 Less than 8 weeks gestation of pregnancy: Secondary | ICD-10-CM | POA: Diagnosis not present

## 2018-10-22 LAB — URINALYSIS, ROUTINE W REFLEX MICROSCOPIC
Bilirubin Urine: NEGATIVE
Glucose, UA: NEGATIVE mg/dL
Hgb urine dipstick: NEGATIVE
Ketones, ur: 80 mg/dL — AB
Leukocytes,Ua: NEGATIVE
Nitrite: NEGATIVE
Protein, ur: 30 mg/dL — AB
Specific Gravity, Urine: 1.03 (ref 1.005–1.030)
pH: 5 (ref 5.0–8.0)

## 2018-10-22 MED ORDER — METOCLOPRAMIDE HCL 5 MG/ML IJ SOLN
5.0000 mg | Freq: Once | INTRAMUSCULAR | Status: AC
  Administered 2018-10-22: 5 mg via INTRAVENOUS
  Filled 2018-10-22: qty 2

## 2018-10-22 MED ORDER — FAMOTIDINE IN NACL 20-0.9 MG/50ML-% IV SOLN
20.0000 mg | Freq: Once | INTRAVENOUS | Status: AC
  Administered 2018-10-22: 20 mg via INTRAVENOUS
  Filled 2018-10-22: qty 50

## 2018-10-22 MED ORDER — DEXTROSE 5 % IN LACTATED RINGERS IV BOLUS
1000.0000 mL | Freq: Once | INTRAVENOUS | Status: AC
  Administered 2018-10-22: 23:00:00 1000 mL via INTRAVENOUS

## 2018-10-22 NOTE — MAU Note (Signed)
Pt reports to MAU c/o not being able to keep food or liquid down. Pt reports she cant keep count of how many times she has vomited. Pt denies vaginal bleeding or discharge. Pt denies abdominal pain. Pt denies any other symptoms.

## 2018-10-22 NOTE — MAU Provider Note (Signed)
History     CSN: [id number]  Arrival date and time: 10/22/18 1540   First Provider Initiated Contact with Patient 10/22/18 2236      Chief Complaint  Patient presents with  . Vomiting with pregnancy   Ruth Solomon is a 18 y.o. G1P0 at [redacted]w[redacted]d who will receive care at [facility].  She presents today for Vomiting with pregnancy.  She states she has not been able to eat for about a week. Patient reports she is not taking any medication for the nausea.  She endorses having a headache prior to arrival and contributes this to dehydration.  However, patient states she is able to drink some fluids and had a lemon slushy with minimal vomiting.  Patient denies pain or vaginal concerns including bleeding, discharge, and leaking.      OB History    Gravida  1   Para      Term      Preterm      AB      Living        SAB      TAB      Ectopic      Multiple      Live Births              History reviewed. No pertinent past medical history.  Past Surgical History:  Procedure Laterality Date  . FOREIGN BODY REMOVAL Left    sewing needle removed from big toe    Family History  Problem Relation Age of Onset  . Healthy Mother   . Healthy Father     Social History   Tobacco Use  . Smoking status: Never Smoker  . Smokeless tobacco: Never Used  Substance Use Topics  . Alcohol use: Never    Frequency: Never  . Drug use: Yes    Types: Marijuana    Comment: last use a few months ago    Allergies: No Known Allergies  No medications prior to admission.    Review of Systems  Constitutional: Negative for chills and fever.  Respiratory: Negative for cough and shortness of breath.   Gastrointestinal: Negative for abdominal pain, constipation, diarrhea, nausea and vomiting.  Genitourinary: Negative for difficulty urinating, dysuria, vaginal bleeding and vaginal discharge.  Musculoskeletal: Negative for back pain.  Neurological: Positive for headaches (Prior to  arrival). Negative for dizziness and light-headedness.   Physical Exam   Blood pressure 106/85, pulse (!) 102, temperature 98.6 F (37 C), temperature source Oral, resp. rate 17, weight 56.9 kg, last menstrual period 09/13/2018, SpO2 99 %.  Physical Exam  Constitutional: She is oriented to person, place, and time. She appears well-developed and well-nourished.  HENT:  Head: Normocephalic and atraumatic.  Eyes: Conjunctivae are normal.  Neck: Normal range of motion.  Cardiovascular: Normal rate, regular rhythm and normal heart sounds.  Respiratory: Effort normal and breath sounds normal.  GI: Soft. There is no abdominal tenderness.  Musculoskeletal: Normal range of motion.  Neurological: She is alert and oriented to person, place, and time.  Skin: Skin is warm and dry.  Psychiatric: She has a normal mood and affect. Her behavior is normal.    MAU Course  Procedures Results for orders placed or performed during the hospital encounter of 10/22/18 (from the past 24 hour(s))  Urinalysis, Routine w reflex microscopic     Status: Abnormal   Collection Time: 10/22/18  7:50 PM  Result Value Ref Range   Color, Urine AMBER (A) YELLOW   APPearance HAZY (A)  CLEAR   Specific Gravity, Urine 1.030 1.005 - 1.030   pH 5.0 5.0 - 8.0   Glucose, UA NEGATIVE NEGATIVE mg/dL   Hgb urine dipstick NEGATIVE NEGATIVE   Bilirubin Urine NEGATIVE NEGATIVE   Ketones, ur 80 (A) NEGATIVE mg/dL   Protein, ur 30 (A) NEGATIVE mg/dL   Nitrite NEGATIVE NEGATIVE   Leukocytes,Ua NEGATIVE NEGATIVE   RBC / HPF 0-5 0 - 5 RBC/hpf   WBC, UA 6-10 0 - 5 WBC/hpf   Bacteria, UA FEW (A) NONE SEEN   Squamous Epithelial / LPF 0-5 0 - 5   Mucus PRESENT     MDM Start IV LR Bolus  Antiemetic PPI Assessment and Plan  18 year old G1P0 5.4 weeks by LMP N/V  -D5 LR ordered by L.L.K, CNM. -Discussed POC and medications that will be given including; *Pepcid  *Reglan  -Extensive education regarding N/V in  pregnancy. -Will reassess after completion of fluids. -No questions or concerns.   Cherre RobinsJessica L Shunte Senseney MSN, CNM 10/22/2018, 10:36 PM   Reassessment (12:24 AM)  -Patient reports improvement with reglan and IV bolus. -Instructed to keep appts as scheduled. -Bleeding Precautions given. -Rx for reglan 10mg  sent to pharmacy on file. -Encouraged to call or return to MAU if symptoms worsen or with the onset of new symptoms. -Discharged to home in improved condition.  Cherre RobinsJessica L Alcus Bradly MSN, CNM 10/23/2018

## 2018-10-23 MED ORDER — METOCLOPRAMIDE HCL 10 MG PO TABS: 10.0000 mg | ORAL_TABLET | Freq: Four times a day (QID) | ORAL | 0 refills | Status: DC

## 2018-10-23 NOTE — Discharge Instructions (Signed)
Morning Sickness ° °Morning sickness is when a woman feels nauseous during pregnancy. This nauseous feeling may or may not come with vomiting. It often occurs in the morning, but it can be a problem at any time of day. Morning sickness is most common during the first trimester. In some cases, it may continue throughout pregnancy. Although morning sickness is unpleasant, it is usually harmless unless the woman develops severe and continual vomiting (hyperemesis gravidarum), a condition that requires more intense treatment. °What are the causes? °The exact cause of this condition is not known, but it seems to be related to normal hormonal changes that occur in pregnancy. °What increases the risk? °You are more likely to develop this condition if: °· You experienced nausea or vomiting before your pregnancy. °· You had morning sickness during a previous pregnancy. °· You are pregnant with more than one baby, such as twins. °What are the signs or symptoms? °Symptoms of this condition include: °· Nausea. °· Vomiting. °How is this diagnosed? °This condition is usually diagnosed based on your signs and symptoms. °How is this treated? °In many cases, treatment is not needed for this condition. Making some changes to what you eat may help to control symptoms. Your health care provider may also prescribe or recommend: °· Vitamin B6 supplements. °· Anti-nausea medicines. °· Ginger. °Follow these instructions at home: °Medicines °· Take over-the-counter and prescription medicines only as told by your health care provider. Do not use any prescription, over-the-counter, or herbal medicines for morning sickness without first talking with your health care provider. °· Taking multivitamins before getting pregnant can prevent or decrease the severity of morning sickness in most women. °Eating and drinking °· Eat a piece of dry toast or crackers before getting out of bed in the morning. °· Eat 5 or 6 small meals a day. °· Eat dry and  bland foods, such as rice or a baked potato. Foods that are high in carbohydrates are often helpful. °· Avoid greasy, fatty, and spicy foods. °· Have someone cook for you if the smell of any food causes nausea and vomiting. °· If you feel nauseous after taking prenatal vitamins, take the vitamins at night or with a snack. °· Snack on protein foods between meals if you are hungry. Nuts, yogurt, and cheese are good options. °· Drink fluids throughout the day. °· Try ginger ale made with real ginger, ginger tea made from fresh grated ginger, or ginger candies. °General instructions °· Do not use any products that contain nicotine or tobacco, such as cigarettes and e-cigarettes. If you need help quitting, ask your health care provider. °· Get an air purifier to keep the air in your house free of odors. °· Get plenty of fresh air. °· Try to avoid odors that trigger your nausea. °· Consider trying these methods to help relieve symptoms: °? Wearing an acupressure wristband. These wristbands are often worn for seasickness. °? Acupuncture. °Contact a health care provider if: °· Your home remedies are not working and you need medicine. °· You feel dizzy or light-headed. °· You are losing weight. °Get help right away if: °· You have persistent and uncontrolled nausea and vomiting. °· You faint. °· You have severe pain in your abdomen. °Summary °· Morning sickness is when a woman feels nauseous during pregnancy. This nauseous feeling may or may not come with vomiting. °· Morning sickness is most common during the first trimester. °· It often occurs in the morning, but it can be a problem at   any time of day. °· In many cases, treatment is not needed for this condition. Making some changes to what you eat may help to control symptoms. °This information is not intended to replace advice given to you by your health care provider. Make sure you discuss any questions you have with your health care provider. °Document Released:  04/07/2006 Document Revised: 01/27/2017 Document Reviewed: 03/19/2016 °Elsevier Patient Education © 2020 [employer] ° °

## 2018-10-24 LAB — CULTURE, OB URINE: Culture: NO GROWTH

## 2018-10-25 ENCOUNTER — Ambulatory Visit: Payer: Self-pay | Admitting: Internal Medicine

## 2018-10-30 ENCOUNTER — Encounter (HOSPITAL_COMMUNITY): Payer: Self-pay

## 2018-10-30 ENCOUNTER — Inpatient Hospital Stay (HOSPITAL_COMMUNITY)
Admission: AD | Admit: 2018-10-30 | Discharge: 2018-10-30 | Disposition: A | Discharge status: Home / Self Care | Payer: Medicaid Other | Attending: Family Medicine | Admitting: Family Medicine

## 2018-10-30 ENCOUNTER — Other Ambulatory Visit: Payer: Self-pay

## 2018-10-30 DIAGNOSIS — O210 Mild hyperemesis gravidarum: Secondary | ICD-10-CM | POA: Diagnosis present

## 2018-10-30 DIAGNOSIS — Z3A01 Less than 8 weeks gestation of pregnancy: Secondary | ICD-10-CM | POA: Diagnosis not present

## 2018-10-30 LAB — URINALYSIS, ROUTINE W REFLEX MICROSCOPIC
Glucose, UA: NEGATIVE mg/dL
Hgb urine dipstick: NEGATIVE
Ketones, ur: 80 mg/dL — AB
Nitrite: NEGATIVE
Protein, ur: 100 mg/dL — AB
Specific Gravity, Urine: 1.033 — ABNORMAL HIGH (ref 1.005–1.030)
pH: 5 (ref 5.0–8.0)

## 2018-10-30 MED ORDER — PROMETHAZINE HCL 25 MG PO TABS: 25.0000 mg | ORAL_TABLET | Freq: Four times a day (QID) | ORAL | 3 refills | Status: DC | PRN

## 2018-10-30 MED ORDER — M.V.I. ADULT IV INJ
Freq: Once | INTRAVENOUS | Status: AC
  Administered 2018-10-30: 18:00:00 via INTRAVENOUS
  Filled 2018-10-30: qty 1000

## 2018-10-30 MED ORDER — PROMETHAZINE HCL 25 MG/ML IJ SOLN
25.0000 mg | Freq: Once | INTRAVENOUS | Status: AC
  Administered 2018-10-30: 25 mg via INTRAVENOUS
  Filled 2018-10-30: qty 1

## 2018-10-30 MED ORDER — FAMOTIDINE 20 MG PO TABS: 20.0000 mg | ORAL_TABLET | Freq: Two times a day (BID) | ORAL | 0 refills | Status: DC

## 2018-10-30 MED ORDER — LACTATED RINGERS IV BOLUS
500.0000 mL | Freq: Once | INTRAVENOUS | Status: AC
  Administered 2018-10-30: 16:00:00 500 mL via INTRAVENOUS

## 2018-10-30 NOTE — MAU Provider Note (Signed)
History     CSN: [id number]  Arrival date and time: 10/30/18 1308   First Provider Initiated Contact with Patient 10/30/18 1432      Chief Complaint  Patient presents with  . Emesis   HPI  Ruth Solomon is a 18 y.o. year old G1P0 female at 1631w5d weeks gestation who presents to MAU reporting N/V, being unable to keep anything down, nausea medication not working, and has not taken medication in 3 days. She was last seen in MAU on 10/22/2018 for the same complaints. She was Rx'd Reglan on 10/22/2018, but states that "it doesn't work". She reports that she last "took a bite" on Sunday 10/28/2018 and that stayed down. She last had some water to drink at noon today, but that did not stay down. She reports that she is "mostly nauseated," but that is causing her to not have an appetite.   Past Medical History:  Diagnosis Date  . Medical history non-contributory     Past Surgical History:  Procedure Laterality Date  . FOREIGN BODY REMOVAL Left    sewing needle removed from big toe    Family History  Problem Relation Age of Onset  . Healthy Mother   . Healthy Father     Social History   Tobacco Use  . Smoking status: Never Smoker  . Smokeless tobacco: Never Used  Substance Use Topics  . Alcohol use: Never    Frequency: Never  . Drug use: Not Currently    Types: Marijuana    Comment: last use a few months ago    Allergies: No Known Allergies  Medications Prior to Admission  Medication Sig Dispense Refill Last Dose  . metoCLOPramide (REGLAN) 10 MG tablet Take 1 tablet (10 mg total) by mouth every 6 (six) hours. 30 tablet 0 Past Week at Unknown time    Review of Systems  Constitutional: Positive for appetite change.  HENT: Negative.   Eyes: Negative.   Respiratory: Negative.   Cardiovascular: Negative.   Gastrointestinal: Positive for nausea and vomiting.  Endocrine: Negative.   Genitourinary: Negative.   Musculoskeletal: Negative.   Skin: Negative.    Allergic/Immunologic: Negative.   Neurological: Positive for weakness.  Hematological: Negative.   Psychiatric/Behavioral: Negative.    Physical Exam   Blood pressure 134/84, pulse 96, temperature 97.7 F (36.5 C), resp. rate 16, last menstrual period 09/13/2018, SpO2 100 %.  Physical Exam  Nursing note and vitals reviewed. Constitutional: She is oriented to person, place, and time. She appears well-developed and well-nourished.  HENT:  Head: Normocephalic and atraumatic.  Eyes: Pupils are equal, round, and reactive to light.  Neck: Normal range of motion.  Cardiovascular: Normal rate.  Respiratory: Effort normal.  GI: Soft. Bowel sounds are decreased.  Genitourinary:    Genitourinary Comments: deferred   Musculoskeletal: Normal range of motion.  Neurological: She is alert and oriented to person, place, and time.  Skin: Skin is warm and dry.  Psychiatric: She has a normal mood and affect. Her behavior is normal. Judgment and thought content normal.    MAU Course  Procedures  MDM [facility] IVFs: Phenergan 25 mg in LR 1000 ml @ 999 ml/hr; followed by MVI in LR 1000 ml @ 500 ml/hr -- no nausea/vomiting PO Challenge -- patient tolerated well  Results for orders placed or performed during the hospital encounter of 10/30/18 (from the past 24 hour(s))  Urinalysis, Routine w reflex microscopic     Status: Abnormal   Collection Time: 10/30/18  2:00 PM  Result Value Ref Range   Color, Urine AMBER (A) YELLOW   APPearance HAZY (A) CLEAR   Specific Gravity, Urine 1.033 (H) 1.005 - 1.030   pH 5.0 5.0 - 8.0   Glucose, UA NEGATIVE NEGATIVE mg/dL   Hgb urine dipstick NEGATIVE NEGATIVE   Bilirubin Urine SMALL (A) NEGATIVE   Ketones, ur 80 (A) NEGATIVE mg/dL   Protein, ur 100 (A) NEGATIVE mg/dL   Nitrite NEGATIVE NEGATIVE   Leukocytes,Ua TRACE (A) NEGATIVE   RBC / HPF 0-5 0 - 5 RBC/hpf   WBC, UA 11-20 0 - 5 WBC/hpf   Bacteria, UA RARE (A) NONE SEEN   Squamous Epithelial / LPF 0-5 0 -  5   Mucus PRESENT     Assessment and Plan  Morning sickness  - Rx for Phenergan 25 mg po or vaginally every 6 hrs prn - Information provided on morning sickness, eating plan in pregnancy and safe OTC medications in pregnancy - Advised to eat protein-rich meals and/or snacks every 2-3 hrs; try to avoid letting stomach get too empty  - Discharge patient - Keep scheduled appt at [facility] on 11/29/2018 - Patient verbalized an understanding of the plan of care and agrees.    Laury Deep, MSN, CNM 10/30/2018, 2:33 PM

## 2018-10-30 NOTE — Discharge Instructions (Signed)
You have been prescribed 2 medications that are safe for nausea and vomiting in pregnancy. Please take them as prescribed.   Safe Medications in Pregnancy   Acne: Benzoyl Peroxide Salicylic Acid  Backache/Headache: Tylenol: 2 regular strength every 4 hours OR              2 Extra strength every 6 hours  Colds/Coughs/Allergies: Benadryl (alcohol free) 25 mg every 6 hours as needed Breath right strips Claritin Cepacol throat lozenges Chloraseptic throat spray Cold-Eeze- up to three times per day Cough drops, alcohol free Flonase (by prescription only) Guaifenesin Mucinex Robitussin DM (plain only, alcohol free) Saline nasal spray/drops Sudafed (pseudoephedrine) & Actifed ** use only after [redacted] weeks gestation and if you do not have high blood pressure Tylenol Vicks Vaporub Zinc lozenges Zyrtec   Constipation: Colace Ducolax suppositories Fleet enema Glycerin suppositories Metamucil Milk of magnesia Miralax Senokot Smooth move tea  Diarrhea: Kaopectate Imodium A-D  *NO pepto Bismol  Hemorrhoids: Anusol Anusol HC Preparation H Tucks  Indigestion: Tums Maalox Mylanta Zantac  Pepcid  Insomnia: Benadryl (alcohol free) 25mg  every 6 hours as needed Tylenol PM Unisom, no Gelcaps  Leg Cramps: Tums MagGel  Nausea/Vomiting:  Bonine Dramamine Emetrol Ginger extract Sea bands Meclizine  Nausea medication to take during pregnancy:  Unisom (doxylamine succinate 25 mg tablets) Take one tablet daily at bedtime. If symptoms are not adequately controlled, the dose can be increased to a maximum recommended dose of two tablets daily (1/2 tablet in the morning, 1/2 tablet mid-afternoon and one at bedtime). Vitamin B6 100mg  tablets. Take one tablet twice a day (up to 200 mg per day).  Skin Rashes: Aveeno products Benadryl cream or 25mg  every 6 hours as needed Calamine Lotion 1% cortisone cream  Yeast infection: Gyne-lotrimin 7 Monistat 7   **If taking  multiple medications, please check labels to avoid duplicating the same active ingredients **take medication as directed on the label ** Do not exceed 4000 mg of tylenol in 24 hours **Do not take medications that contain aspirin or ibuprofen

## 2018-10-30 NOTE — MAU Note (Signed)
.   Ruth Solomon is a 18 y.o. at [redacted]w[redacted]d here in MAU reporting: nausea and vomiting, not able to keep anything down. Was evaluated in MAU a week ago and given reglan and states it is not helping. Hasn't taken since 3 days ago  Onset of complaint: N/V Pain score: 0 Vitals:   10/30/18 1347  BP: 134/84  Pulse: 96  Resp: 16  Temp: 97.7 F (36.5 C)  SpO2: 100%     FHT: Lab orders placed from triage: UA

## 2018-11-29 ENCOUNTER — Encounter: Payer: Self-pay | Admitting: Certified Nurse Midwife

## 2018-12-05 ENCOUNTER — Other Ambulatory Visit: Payer: Self-pay

## 2018-12-05 ENCOUNTER — Inpatient Hospital Stay (HOSPITAL_COMMUNITY)
Admission: AD | Admit: 2018-12-05 | Discharge: 2018-12-05 | Disposition: A | Discharge status: Home / Self Care | Payer: Medicaid Other | Attending: Obstetrics & Gynecology | Admitting: Obstetrics & Gynecology

## 2018-12-05 ENCOUNTER — Encounter (HOSPITAL_COMMUNITY): Payer: Self-pay

## 2018-12-05 DIAGNOSIS — E860 Dehydration: Secondary | ICD-10-CM | POA: Diagnosis not present

## 2018-12-05 DIAGNOSIS — O219 Vomiting of pregnancy, unspecified: Secondary | ICD-10-CM | POA: Insufficient documentation

## 2018-12-05 DIAGNOSIS — O99611 Diseases of the digestive system complicating pregnancy, first trimester: Secondary | ICD-10-CM | POA: Diagnosis present

## 2018-12-05 DIAGNOSIS — O210 Mild hyperemesis gravidarum: Secondary | ICD-10-CM

## 2018-12-05 DIAGNOSIS — Z3A11 11 weeks gestation of pregnancy: Secondary | ICD-10-CM | POA: Insufficient documentation

## 2018-12-05 DIAGNOSIS — K5900 Constipation, unspecified: Secondary | ICD-10-CM | POA: Diagnosis not present

## 2018-12-05 DIAGNOSIS — K117 Disturbances of salivary secretion: Secondary | ICD-10-CM

## 2018-12-05 DIAGNOSIS — O26891 Other specified pregnancy related conditions, first trimester: Secondary | ICD-10-CM | POA: Diagnosis not present

## 2018-12-05 LAB — URINALYSIS, ROUTINE W REFLEX MICROSCOPIC
Bilirubin Urine: NEGATIVE
Glucose, UA: NEGATIVE mg/dL
Hgb urine dipstick: NEGATIVE
Ketones, ur: 80 mg/dL — AB
Leukocytes,Ua: NEGATIVE
Nitrite: NEGATIVE
Protein, ur: 30 mg/dL — AB
Specific Gravity, Urine: 1.026 (ref 1.005–1.030)
pH: 5 (ref 5.0–8.0)

## 2018-12-05 MED ORDER — LACTATED RINGERS IV BOLUS
1000.0000 mL | Freq: Once | INTRAVENOUS | Status: AC
  Administered 2018-12-05: 1000 mL via INTRAVENOUS

## 2018-12-05 MED ORDER — GLYCOPYRROLATE 0.2 MG/ML IJ SOLN
0.2000 mg | Freq: Once | INTRAMUSCULAR | Status: AC
  Administered 2018-12-05: 0.2 mg via INTRAVENOUS
  Filled 2018-12-05: qty 1

## 2018-12-05 MED ORDER — METOCLOPRAMIDE HCL 5 MG/ML IJ SOLN
10.0000 mg | Freq: Once | INTRAMUSCULAR | Status: AC
  Administered 2018-12-05: 10 mg via INTRAVENOUS
  Filled 2018-12-05: qty 2

## 2018-12-05 MED ORDER — ONDANSETRON 4 MG PO TBDP
4.0000 mg | ORAL_TABLET | Freq: Once | ORAL | Status: AC
  Administered 2018-12-05: 4 mg via ORAL
  Filled 2018-12-05: qty 1

## 2018-12-05 MED ORDER — M.V.I. ADULT IV INJ
Freq: Once | INTRAVENOUS | Status: AC
  Administered 2018-12-05: 21:00:00 via INTRAVENOUS
  Filled 2018-12-05: qty 10

## 2018-12-05 MED ORDER — DIPHENHYDRAMINE HCL 50 MG/ML IJ SOLN
25.0000 mg | Freq: Once | INTRAMUSCULAR | Status: AC
  Administered 2018-12-05: 25 mg via INTRAVENOUS
  Filled 2018-12-05: qty 1

## 2018-12-05 NOTE — MAU Note (Addendum)
PT has been having nausea and vomiting for three days. Also constipated. Last BM was a week ago. She was give RX for phenergan but has not picked up d/t no medicaid. She is not able to keep down any food or drink.

## 2018-12-05 NOTE — Discharge Instructions (Signed)
Morning Sickness  Morning sickness is when you feel sick to your stomach (nauseous) during pregnancy. You may feel sick to your stomach and throw up (vomit). You may feel sick in the morning, but you can feel this way at any time of day. Some women feel very sick to their stomach and cannot stop throwing up (hyperemesis gravidarum). Follow these instructions at home: Medicines  Take over-the-counter and prescription medicines only as told by your doctor. Do not take any medicines until you talk with your doctor about them first.  Taking multivitamins before getting pregnant can stop or lessen the harshness of morning sickness. Eating and drinking  Eat dry toast or crackers before getting out of bed.  Eat 5 or 6 small meals a day.  Eat dry and bland foods like rice and baked potatoes.  Do not eat greasy, fatty, or spicy foods.  Have someone cook for you if the smell of food causes you to feel sick or throw up.  If you feel sick to your stomach after taking prenatal vitamins, take them at night or with a snack.  Eat protein when you need a snack. Nuts, yogurt, and cheese are good choices.  Drink fluids throughout the day.  Try ginger ale made with real ginger, ginger tea made from fresh grated ginger, or ginger candies. General instructions  Do not use any products that have nicotine or tobacco in them, such as cigarettes and e-cigarettes. If you need help quitting, ask your doctor.  Use an air purifier to keep the air in your house free of smells.  Get lots of fresh air.  Try to avoid smells that make you feel sick.  Try: ? Wearing a bracelet that is used for seasickness (acupressure wristband). ? Going to a doctor who puts thin needles into certain body points (acupuncture) to improve how you feel. Contact a doctor if:  You need medicine to feel better.  You feel dizzy or light-headed.  You are losing weight. Get help right away if:  You feel very sick to your  stomach and cannot stop throwing up.  You pass out (faint).  You have very bad pain in your belly. Summary  Morning sickness is when you feel sick to your stomach (nauseous) during pregnancy.  You may feel sick in the morning, but you can feel this way at any time of day.  Making some changes to what you eat may help your symptoms go away. This information is not intended to replace advice given to you by your health care provider. Make sure you discuss any questions you have with your health care provider. Document Released: 03/24/2004 Document Revised: 01/27/2017 Document Reviewed: 03/17/2016 Elsevier Patient Education  2020 [employer]   Dehydration, Adult  Dehydration is a condition in which there is not enough fluid or water in the body. This happens when you lose more fluids than you take in. Important organs, such as the kidneys, brain, and heart, cannot function without a proper amount of fluids. Any loss of fluids from the body can lead to dehydration. Dehydration can range from mild to severe. This condition should be treated right away to prevent it from becoming severe. What are the causes? This condition may be caused by:  Vomiting.  Diarrhea.  Excessive sweating, such as from heat exposure or exercise.  Not drinking enough fluid, especially: ? When ill. ? While doing activity that requires a lot of energy.  Excessive urination.  Fever.  Infection.  Certain medicines,  such as medicines that cause the body to lose excess fluid (diuretics).  Inability to access safe drinking water.  Reduced physical ability to get adequate water and food. What increases the risk? This condition is more likely to develop in people:  Who have a poorly controlled long-term (chronic) illness, such as diabetes, heart disease, or kidney disease.  Who are age 63 or older.  Who are disabled.  Who live in a place with high altitude.  Who play endurance sports. What are  the signs or symptoms? Symptoms of mild dehydration may include:  Thirst.  Dry lips.  Slightly dry mouth.  Dry, warm skin.  Dizziness. Symptoms of moderate dehydration may include:  Very dry mouth.  Muscle cramps.  Dark urine. Urine may be the color of tea.  Decreased urine production.  Decreased tear production.  Heartbeat that is irregular or faster than normal (palpitations).  Headache.  Light-headedness, especially when you stand up from a sitting position.  Fainting (syncope). Symptoms of severe dehydration may include:  Changes in skin, such as: ? Cold and clammy skin. ? Blotchy (mottled) or pale skin. ? Skin that does not quickly return to normal after being lightly pinched and released (poor skin turgor).  Changes in body fluids, such as: ? Extreme thirst. ? No tear production. ? Inability to sweat when body temperature is high, such as in hot weather. ? Very little urine production.  Changes in vital signs, such as: ? Weak pulse. ? Pulse that is more than 100 beats a minute when sitting still. ? Rapid breathing. ? Low blood pressure.  Other changes, such as: ? Sunken eyes. ? Cold hands and feet. ? Confusion. ? Lack of energy (lethargy). ? Difficulty waking up from sleep. ? Short-term weight loss. ? Unconsciousness. How is this diagnosed? This condition is diagnosed based on your symptoms and a physical exam. Blood and urine tests may be done to help confirm the diagnosis. How is this treated? Treatment for this condition depends on the severity. Mild or moderate dehydration can often be treated at home. Treatment should be started right away. Do not wait until dehydration becomes severe. Severe dehydration is an emergency and it needs to be treated in a hospital. Treatment for mild dehydration may include:  Drinking more fluids.  Replacing salts and minerals in your blood (electrolytes) that you may have lost. Treatment for moderate  dehydration may include:  Drinking an oral rehydration solution (ORS). This is a drink that helps you replace fluids and electrolytes (rehydrate). It can be found at pharmacies and retail stores. Treatment for severe dehydration may include:  Receiving fluids through an IV tube.  Receiving an electrolyte solution through a feeding tube that is passed through your nose and into your stomach (nasogastric tube, or NG tube).  Correcting any abnormalities in electrolytes.  Treating the underlying cause of dehydration. Follow these instructions at home:  If directed by your health care provider, drink an ORS: ? Make an ORS by following instructions on the package. ? Start by drinking small amounts, about  cup (120 mL) every 5-10 minutes. ? Slowly increase how much you drink until you have taken the amount recommended by your health care provider.  Drink enough clear fluid to keep your urine clear or pale yellow. If you were told to drink an ORS, finish the ORS first, then start slowly drinking other clear fluids. Drink fluids such as: ? Water. Do not drink only water. Doing that can lead to having too  little salt (sodium) in the body (hyponatremia). ? Ice chips. ? Fruit juice that you have added water to (diluted fruit juice). ? Low-calorie sports drinks.  Avoid: ? Alcohol. ? Drinks that contain a lot of sugar. These include high-calorie sports drinks, fruit juice that is not diluted, and soda. ? Caffeine. ? Foods that are greasy or contain a lot of fat or sugar.  Take over-the-counter and prescription medicines only as told by your health care provider.  Do not take sodium tablets. This can lead to having too much sodium in the body (hypernatremia).  Eat foods that contain a healthy balance of electrolytes, such as bananas, oranges, potatoes, tomatoes, and spinach.  Keep all follow-up visits as told by your health care provider. This is important. Contact a health care provider  if:  You have abdominal pain that: ? Gets worse. ? Stays in one area (localizes).  You have a rash.  You have a stiff neck.  You are more irritable than usual.  You are sleepier or more difficult to wake up than usual.  You feel weak or dizzy.  You feel very thirsty.  You have urinated only a small amount of very dark urine over 6-8 hours. Get help right away if:  You have symptoms of severe dehydration.  You cannot drink fluids without vomiting.  Your symptoms get worse with treatment.  You have a fever.  You have a severe headache.  You have vomiting or diarrhea that: ? Gets worse. ? Does not go away.  You have blood or green matter (bile) in your vomit.  You have blood in your stool. This may cause stool to look black and tarry.  You have not urinated in 6-8 hours.  You faint.  Your heart rate while sitting still is over 100 beats a minute.  You have trouble breathing. This information is not intended to replace advice given to you by your health care provider. Make sure you discuss any questions you have with your health care provider. Document Released: 02/14/2005 Document Revised: 01/27/2017 Document Reviewed: 04/10/2015 Elsevier Patient Education  2020 [employer]

## 2018-12-05 NOTE — [facility] Provider Note (Addendum)
History     CSN: [id number]  Arrival date and time: 12/05/18 1716   First Provider Initiated Contact with Patient 12/05/18 1831     Chief Complaint  Patient presents with  . Nausea   HPI Ruth Solomon is a 18 y.o. G1P0 at [redacted]w[redacted]d by [place] who presents to [facility] with chief complaint of nausea and vomiting in pregnancy. This is a recurring problem, current episode started three days ago. Patient states she is constantly spitting and feeling nauseated, vomits "all day long". She was previously prescribed Phenergan but has not secured her Medicaid and could not afford the out of pocket cost.   Patient has secondary complaint of constipation. She has not had a bowel movement in six or seven days.   She denies vaginal bleeding, abnormal vaginal discharge, fever, falls, or recent illness.   OB History    Gravida  1   Para      Term      Preterm      AB      Living        SAB      TAB      Ectopic      Multiple      Live Births              Past Medical History:  Diagnosis Date  . Medical history non-contributory     Past Surgical History:  Procedure Laterality Date  . FOREIGN BODY REMOVAL Left    sewing needle removed from big toe    Family History  Problem Relation Age of Onset  . Healthy Mother   . Healthy Father     Social History   Tobacco Use  . Smoking status: Never Smoker  . Smokeless tobacco: Never Used  Substance Use Topics  . Alcohol use: Never    Frequency: Never  . Drug use: Not Currently    Types: Marijuana    Comment: last use a few months ago    Allergies: No Known Allergies  Medications Prior to Admission  Medication Sig Dispense Refill Last Dose  . famotidine (PEPCID) 20 MG tablet Take 1 tablet (20 mg total) by mouth 2 (two) times daily. 60 tablet 0   . promethazine (PHENERGAN) 25 MG tablet Take 1 tablet (25 mg total) by mouth every 6 (six) hours as needed for nausea or vomiting. If unable to keep anything down, insert vaginally  30 tablet 3 not taking    Review of Systems  Constitutional: Positive for fatigue. Negative for chills and fever.  Gastrointestinal: Positive for constipation, nausea and vomiting. Negative for abdominal pain.  Musculoskeletal: Negative for back pain.  All other systems reviewed and are negative.  Physical Exam   Blood pressure 105/75, pulse (!) 110, resp. rate 18, height 5\' 2"  (2.505 m), weight 52.9 kg, last menstrual period 09/13/2018, SpO2 100 %.  Physical Exam  Nursing note and vitals reviewed. Constitutional: She is oriented to person, place, and time. She appears well-developed and well-nourished. She appears lethargic. She appears ill.  Eyes: Conjunctivae are normal.  Cardiovascular: Normal rate.  Respiratory: Effort normal and breath sounds normal. No respiratory distress.  GI: Soft. Bowel sounds are normal. She exhibits no distension. There is no abdominal tenderness. There is no rebound, no guarding and no CVA tenderness.  Genitourinary:    Genitourinary Comments: Deferred   Neurological: She is oriented to person, place, and time. She appears lethargic.  Skin: Skin is warm and dry.  Psychiatric: She has a  normal mood and affect. Her behavior is normal. Judgment and thought content normal.    [facility] Course/MDM  Procedures  --Bedside Ultrasound for FHR check: 145 bpm Patient informed that the ultrasound is considered a limited obstetric ultrasound and is not intended to be a complete ultrasound exam.  Patient also informed that the ultrasound is not being completed with the intent of assessing for fetal or placental anomalies or any pelvic abnormalities.  Explained that the purpose of today's ultrasound is to assess for fetal heart rate.  Patient acknowledges the purpose of the exam and the limitations of the study.   --Patient vomited after sub-lingual Zofran  Report given to M. Mayford Knife, CNM who assumes care at this time.  Clayton Bibles, MSN, CNM Certified Nurse  Midwife, [facility] 12/05/18 9:05 PM    Assessment and Plan  Felt much better after fluids and medication Results for orders placed or performed during the hospital encounter of 12/05/18 (from the past 24 hour(s))  Urinalysis, Routine w reflex microscopic     Status: Abnormal   Collection Time: 12/05/18  6:30 PM  Result Value Ref Range   Color, Urine AMBER (A) YELLOW   APPearance CLEAR CLEAR   Specific Gravity, Urine 1.026 1.005 - 1.030   pH 5.0 5.0 - 8.0   Glucose, UA NEGATIVE NEGATIVE mg/dL   Hgb urine dipstick NEGATIVE NEGATIVE   Bilirubin Urine NEGATIVE NEGATIVE   Ketones, ur 80 (A) NEGATIVE mg/dL   Protein, ur 30 (A) NEGATIVE mg/dL   Nitrite NEGATIVE NEGATIVE   Leukocytes,Ua NEGATIVE NEGATIVE   RBC / HPF 0-5 0 - 5 RBC/hpf   WBC, UA 0-5 0 - 5 WBC/hpf   Bacteria, UA RARE (A) NONE SEEN   Squamous Epithelial / LPF 0-5 0 - 5   Mucus PRESENT    Hyaline Casts, UA PRESENT    Tolerated PO challenge Wants to go home  Aviva Signs, CNM

## 2018-12-06 ENCOUNTER — Other Ambulatory Visit (HOSPITAL_COMMUNITY): Payer: Self-pay | Admitting: Family

## 2018-12-06 DIAGNOSIS — Z3A13 13 weeks gestation of pregnancy: Secondary | ICD-10-CM

## 2018-12-06 DIAGNOSIS — Z369 Encounter for antenatal screening, unspecified: Secondary | ICD-10-CM

## 2018-12-06 LAB — OB RESULTS CONSOLE GC/CHLAMYDIA
Chlamydia: NEGATIVE
Gonorrhea: NEGATIVE

## 2018-12-06 LAB — OB RESULTS CONSOLE RPR: RPR: NONREACTIVE

## 2018-12-06 LAB — OB RESULTS CONSOLE PLATELET COUNT: Platelets: 103

## 2018-12-06 LAB — OB RESULTS CONSOLE VARICELLA ZOSTER ANTIBODY, IGG: Varicella: NON-IMMUNE/NOT IMMUNE

## 2018-12-06 LAB — OB RESULTS CONSOLE HGB/HCT, BLOOD
HCT: 37 (ref 29–41)
Hemoglobin: 12.2

## 2018-12-06 LAB — OB RESULTS CONSOLE HIV ANTIBODY (ROUTINE TESTING): HIV: NONREACTIVE

## 2018-12-06 LAB — OB RESULTS CONSOLE RUBELLA ANTIBODY, IGM: Rubella: IMMUNE

## 2018-12-06 LAB — OB RESULTS CONSOLE ANTIBODY SCREEN: Antibody Screen: NEGATIVE

## 2018-12-06 LAB — OB RESULTS CONSOLE ABO/RH: RH Type: POSITIVE

## 2018-12-06 LAB — OB RESULTS CONSOLE HEPATITIS B SURFACE ANTIGEN: Hepatitis B Surface Ag: NEGATIVE

## 2018-12-06 LAB — AMB REFERRAL TO OB-GYN: Urine Culture, OB: NEGATIVE

## 2018-12-06 LAB — CYSTIC FIBROSIS DIAGNOSTIC STUDY: Interpretation-CFDNA:: NEGATIVE

## 2018-12-13 ENCOUNTER — Encounter (HOSPITAL_COMMUNITY): Payer: Self-pay | Admitting: *Deleted

## 2018-12-13 ENCOUNTER — Ambulatory Visit (HOSPITAL_COMMUNITY): Payer: Medicaid Other | Admitting: *Deleted

## 2018-12-13 ENCOUNTER — Ambulatory Visit (HOSPITAL_COMMUNITY)
Admission: RE | Admit: 2018-12-13 | Discharge: 2018-12-13 | Disposition: A | Discharge status: Home / Self Care | Payer: Medicaid Other | Source: Ambulatory Visit | Attending: Obstetrics and Gynecology | Admitting: Obstetrics and Gynecology

## 2018-12-13 ENCOUNTER — Ambulatory Visit (HOSPITAL_COMMUNITY): Payer: Medicaid Other

## 2018-12-13 ENCOUNTER — Other Ambulatory Visit: Payer: Self-pay

## 2018-12-13 DIAGNOSIS — O210 Mild hyperemesis gravidarum: Secondary | ICD-10-CM | POA: Insufficient documentation

## 2018-12-13 DIAGNOSIS — Z369 Encounter for antenatal screening, unspecified: Secondary | ICD-10-CM | POA: Insufficient documentation

## 2018-12-13 DIAGNOSIS — Z3A14 14 weeks gestation of pregnancy: Secondary | ICD-10-CM

## 2018-12-13 DIAGNOSIS — Z3A13 13 weeks gestation of pregnancy: Secondary | ICD-10-CM

## 2018-12-13 DIAGNOSIS — O359XX0 Maternal care for (suspected) fetal abnormality and damage, unspecified, not applicable or unspecified: Secondary | ICD-10-CM | POA: Diagnosis not present

## 2018-12-13 DIAGNOSIS — Z3682 Encounter for antenatal screening for nuchal translucency: Secondary | ICD-10-CM | POA: Insufficient documentation

## 2018-12-14 ENCOUNTER — Ambulatory Visit (HOSPITAL_COMMUNITY): Payer: Self-pay

## 2018-12-14 ENCOUNTER — Other Ambulatory Visit (HOSPITAL_COMMUNITY): Payer: Self-pay | Admitting: Obstetrics & Gynecology

## 2018-12-19 ENCOUNTER — Other Ambulatory Visit: Payer: Self-pay

## 2018-12-19 ENCOUNTER — Ambulatory Visit (HOSPITAL_COMMUNITY): Payer: Self-pay | Admitting: Genetic Counselor

## 2018-12-19 ENCOUNTER — Ambulatory Visit (HOSPITAL_COMMUNITY): Payer: Medicaid Other | Attending: Obstetrics and Gynecology | Admitting: Genetic Counselor

## 2018-12-19 DIAGNOSIS — Z315 Encounter for genetic counseling: Secondary | ICD-10-CM

## 2018-12-19 DIAGNOSIS — Z36 Encounter for antenatal screening for chromosomal anomalies: Secondary | ICD-10-CM

## 2018-12-19 DIAGNOSIS — Z3A15 15 weeks gestation of pregnancy: Secondary | ICD-10-CM | POA: Diagnosis not present

## 2018-12-19 DIAGNOSIS — D573 Sickle-cell trait: Secondary | ICD-10-CM

## 2018-12-20 ENCOUNTER — Ambulatory Visit (HOSPITAL_COMMUNITY): Payer: Self-pay

## 2018-12-24 LAB — AMB REFERRAL TO OB-GYN

## 2018-12-25 NOTE — Progress Notes (Signed)
12/19/2018  Ruth Solomon 26-Feb-2001 MRN: [id number] DOV: 12/19/2018  [name] presented to the [facility] for a genetics consultation regarding screening for chromosomal aneuploidies. [name] came to her appointment alone due to COVID-19 visitor restrictions.   Indication for genetic counseling - General pregnancy screening  Prenatal history  [name] is a [id number], 18 y.o. year old female. Her current pregnancy has completed [redacted]w[redacted]d(Estimated Date of Delivery: 06/12/19).  Ms. CGelldenied exposure to environmental toxins or chemical agents. She denied the use of alcohol, tobacco or street drugs. She denied significant viral illnesses, fevers, and bleeding during the course of her pregnancy. Her medical and surgical histories were noncontributory.  Family History  A three generation pedigree was drafted and reviewed. The family history is remarkable for the following:  - Ruth Solomon her mother both carry sickle cell trait by verbal report. [name] that her partner, Ruth Solomon does not carry sickle cell trait. See Discussion section for more details.   - Ruth Solomon a twin brother who has learning problems. He was held back for one year in school. We discussed that many times, learning difficulties are multifactorial in nature, occurring due to a combination of genetic and environmental factors that are difficult to identify. Learning disabilities can appear to run in families; thus, there is a chance that the couple's children could also experience learning difficulties of some kind. Ms. CScarpatiunderstands that she should make the pediatrician aware of any concerns she has about her children's development.  The remaining family histories were reviewed and found to be noncontributory for birth defects, intellectual disability, recurrent pregnancy loss, and known genetic conditions. Ms. CGartleyhad limited information  about her partner's extended family history; thus, risk assessment was limited.   The patient's ethnicity is African American. The father of the pregnancy's ethnicity is African American. Ashkenazi Jewish ancestry and consanguinity were denied. Pedigree will be scanned under Media.  Discussion  Ms. CFulghumwas referred to genetic counseling as she was interested in pursuing noninvasive prenatal screening (NIPS) for the current pregnancy. [facility] first trimester screening; however, the fetal CRL measured greater than the limits of first trimester screening, so that was no longer a viable option for aneuploidy screening. Since Ms. CAcriwas still interested in pursuing aneuploidy screening, we had a discussion about NIPS.  We reviewed noninvasive prenatal screening (NIPS) as an available screening option. Specifically, we discussed that NIPS analyzes cell free DNA originating from the placenta that is found in the maternal blood circulation during pregnancy. This test is not diagnostic for chromosome conditions, but can provide information regarding the presence or absence of extra fetal DNA for chromosomes 13, 18, 21, and the sex chromosomes. Thus, it would not identify or rule out all fetal aneuploidy. The reported detection rate is 91-99% for trisomies 21, 18, 13, and sex chromosome aneuploidies. The false positive rate is reported to be less than 0.1% for any of these conditions. Ms. CBrogdonindicated that she is interested in undergoing NIPS.  Ms. 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 I inquired if Mr. Ruth Donewas evaluated for sickle cell anemia (SCA) only or all hemoglobinopathies, which she was not sure of. We discussed that SCA is one condition in a group of blood disorders that affect hemoglobin in red  blood cells (hemoglobinopathies).  Hemoglobin is a protein that transports oxygen from the lungs to organs and tissues throughout the body. Individuals with SCA have an inherited structural abnormality in hemoglobin's beta globin chains due to a single amino acid change in the HBB gene. Instead of producing normal adult hemoglobin (Hgb A), individuals with SCA produce an atypical form of hemoglobin called hemoglobin S (Hgb S). Typically, individuals are expected to have two copies of Hgb A (Hgb AA). Individuals who are carriers of SCA have one copy of Hgb A and one copy of Hgb S (Hgb AS), whereas individuals affected by SCA have two copies of Hgb S (Hgb SS). Carriers of SCA are often said to have sickle cell "trait".  Hgb S alters the configuration of the hemoglobin molecule. As a result, individuals with SCA have red blood cells that can sickle and obstruct blood flow in small blood vessels, causing ischemia of tissues and organs and episodes of vaso-occlusive crisis. The amino acid change in the HBB gene also causes red blood cells to become fragile and break down easily, which results in chronic anemia. Additional complications associated with SCA may include organ damage, frequent infections, acute chest syndrome, ischemic stroke, splenic sequestration, priapism, and pulmonary hypertension. SCA is inherited in an autosomal recessive pattern, where both parents must carry Hgb S trait to be at risk of having an affected child. If Ms. [place] partner were also a carrier of SCA, they would have a 1 in 4 (25%) chance of having a child with SCA.   Hgb S is just one variant form of hemoglobin caused by a mutations in the HBB gene. It is also possible that [name] partner could carry another variant form of hemoglobin, such as hemoglobin C. If he did, the couple would have a 1 in 4 (25%) chance of having a child with hemoglobin [place] disease (HbSC disease). Individuals with HbSC disease have red blood cells that contain both hemoglobin S and  hemoglobin C. These variant forms of hemoglobin can cause red blood cells to become rigid sickle, blocking small blood vessels and making it difficult for the red blood cells to deliver oxygen to the body's tissues. This can cause severe pain and organ damage, just as we see in individuals with sickle cell disease. Individuals with HbSC disease are at risk of the same complications as those associated with sickle cell disease, such as pain crises, acute chest syndrome, infections, asplenia, and strokes; however, these complications may occur at a lesser frequency.  Finally, [name] partner may have a different variant in the HBB gene that could make a carrier of beta-thalassemia. If he did, the couple would have a 1 in 4 (25%) chance of having a child with sickle beta thalassemia. The severity of sickle beta thalassemia depends on the normal amount of beta globin that is produced. If an individual produces no beta globin (sickle beta zero thalassemia), they will experience symptoms similar to SCA. If an individual produces a reduced amount of beta globin (sickle beta plus thalassemia), they may experience symptoms that are similar to a milder form of SCA.  Given his ethnicity, [name] partner has a 1 in 11 chance of carrying hemoglobin S trait, a 1 in 38 chance of carrying hemoglobin C trait, and a 1 in 97 chance of carrying beta-thalassemia. It is recommended that he undergo carrier screening to refine the risks for the current pregnancy to be affected with sickle cell disease, HbC disease, or sickle beta  thalassemia. [name] was not interested in pursuing partner carrier screening.  Per ACOG recommendation, carrier screening for cystic fibrosis (CF) and spinal muscular atrophy (SMA) was discussed including information about the conditions, rationale for testing, autosomal recessive inheritance, and the option of prenatal diagnosis. We also discussed the option of carrier screening for  hemoglobinopathies to confirm that [name] is in fact a carrier of sickle cell disease. The patient was informed that select hemoglobinopathies and CF are included on [place]'s newborn screen, and that SMA can be added on to a baby's newborn screen via the Early Check research study. Based on ethnicity alone, [name]'s risk to be a carrier of CF is 1 in 33. Her risk to be a carrier of SMA is 1 in 61. [name] was not interested in pursuing carrier screening for CF, SMA, and hemoglobinopathies.  [name] was also counseled regarding the option of diagnostic testing viachorionic villus sampling (CVS) oramniocentesis. We discussed the technical aspects of each procedure and quoted up to a 1 in 500 (0.2%) risk for spontaneous pregnancy loss or other adverse pregnancy outcomes as a result of either procedure. Cultured cells from either a placental or amniotic fluid sample allow for the visualization of a fetal karyotype, which can detect >99% of chromosomal aberrations. Chromosomal microarray can also be performed to identify smallerdeletions or duplications offetalchromosomal material. Diagnostic testing could also be performed to assess whether the baby is affected by other genetic conditions, such as SCA.After careful consideration, [name] declined diagnostic testing at this time. She understands that diagnostic testing is available at any point through the end of pregnancy and that she may opt to undergo the procedure at a later date should she change her mind.  [name] was offered NIPS through the laboratory [facility], as she is currently uninsured and qualifies for free testing through [facility]'s Patient Assistance Program. I requested that she email me a photo of her tax forms from last year so that I can facilitate the free testing process. [name] was provided with an [facility] NIPS blood draw kit and instructed to go to [facility] in [place] to get her  blood drawn at her convenience. Results from NIPS will take 5-7 days to be returned from the time that the laboratory receives the sample. I will call her when results become available. [name] was also provided with my business card and encouraged to contact me if she changes her mind about carrier screening for herself or partner carrier screening for hemoglobinopathies.   I counseled [name] regarding the above risks and available options. The approximate face-to-face time with the genetic counselor was 20 minutes.  In summary:  Discussed noninvasive prenatal screening (NIPS) for fetal aneuploidies ? Opted to undergo NIPS. We will follow results  Discussed carrier screening for cystic fibrosis, spinal muscular atrophy, and hemoglobinopathies ? By verbal report, patient is a carrier of sickle cell disease and partner is not  ? Declined carrier screening for cystic fibrosis, spinal muscular atrophy, and for confirmation of sickle cell trait ? Declined partner carrier screening for hemoglobinopathies to confirm that baby is not at risk for sickle cell disease or similar condition  Offered additional testing and screening ? Declined CVS  Reviewed family history concerns   Buelah Manis, MS Genetic Counselor

## 2019-01-09 ENCOUNTER — Inpatient Hospital Stay (HOSPITAL_COMMUNITY)
Admission: AD | Admit: 2019-01-09 | Discharge: 2019-01-10 | Disposition: A | Discharge status: Home / Self Care | Payer: Medicaid Other | Source: Ambulatory Visit | Attending: Obstetrics & Gynecology | Admitting: Obstetrics & Gynecology

## 2019-01-09 ENCOUNTER — Encounter (HOSPITAL_COMMUNITY): Payer: Self-pay

## 2019-01-09 ENCOUNTER — Other Ambulatory Visit: Payer: Self-pay

## 2019-01-09 ENCOUNTER — Telehealth: Payer: Self-pay | Admitting: Student

## 2019-01-09 DIAGNOSIS — O26892 Other specified pregnancy related conditions, second trimester: Secondary | ICD-10-CM | POA: Insufficient documentation

## 2019-01-09 DIAGNOSIS — K5900 Constipation, unspecified: Secondary | ICD-10-CM

## 2019-01-09 DIAGNOSIS — K5641 Fecal impaction: Secondary | ICD-10-CM | POA: Insufficient documentation

## 2019-01-09 DIAGNOSIS — Z3A18 18 weeks gestation of pregnancy: Secondary | ICD-10-CM | POA: Insufficient documentation

## 2019-01-09 NOTE — Telephone Encounter (Signed)
Patient Ruth Solomon called [facility] because the [facility] is closed and she is experiencing constipation. She has not had a BM in two weeks. She has been taking colace twice a day, and Miralax twice a day.   I recommended that patient do the following:  1. Fleet enema x 1, if no relief, try a second dose. Then, increase docusate sodium to three times a day, add a fiber tablet or wafer, and increase water, decrease juice, and keep doing miralax.  2. If enemas don't work, call back and we can discuss a bowel prep solution.  3. If that doesn't work, she should come in for evaluation and di-impactation.   Maye Hides

## 2019-01-09 NOTE — [facility] Note (Signed)
Pt states she has been constipated for a few weeks. Today she called the hospital and spoke to a nurse about what was going on and states she was told to do an enema at home. Pt states that she got one and gave the enema to herself at 1700. Pt now reports that she is having diarrhea from the enema, but can still feel the hard stool sitting in her bottom.  Pt reports using miralax for 2 weeks prior to the enema today.   Pt states constipation is an on going issue for her even before she was pregnant.

## 2019-01-10 DIAGNOSIS — Z3A18 18 weeks gestation of pregnancy: Secondary | ICD-10-CM | POA: Diagnosis not present

## 2019-01-10 DIAGNOSIS — K5900 Constipation, unspecified: Secondary | ICD-10-CM | POA: Diagnosis not present

## 2019-01-10 DIAGNOSIS — O26892 Other specified pregnancy related conditions, second trimester: Secondary | ICD-10-CM

## 2019-01-10 DIAGNOSIS — K5909 Other constipation: Secondary | ICD-10-CM | POA: Diagnosis present

## 2019-01-10 DIAGNOSIS — K5641 Fecal impaction: Secondary | ICD-10-CM | POA: Diagnosis not present

## 2019-01-10 MED ORDER — POLYETHYLENE GLYCOL 3350 17 G PO PACK: 17.0000 g | PACK | Freq: Every day | ORAL | 0 refills | Status: DC

## 2019-01-10 MED ORDER — DOCUSATE SODIUM 100 MG PO CAPS: 100.0000 mg | ORAL_CAPSULE | Freq: Two times a day (BID) | ORAL | 2 refills | Status: DC | PRN

## 2019-01-10 MED ORDER — FLEET ENEMA 7-19 GM/118ML RE ENEM
1.0000 | ENEMA | Freq: Once | RECTAL | Status: AC
  Administered 2019-01-10: 04:00:00 1 via RECTAL

## 2019-01-10 MED ORDER — LORAZEPAM 1 MG PO TABS
0.5000 mg | ORAL_TABLET | Freq: Once | ORAL | Status: AC
  Administered 2019-01-10: 02:00:00 0.5 mg via ORAL
  Filled 2019-01-10: qty 1

## 2019-01-10 NOTE — Discharge Instructions (Signed)
You have constipation which is hard stools that are difficult to pass. It is important to have regular bowel movements every 1-3 days that are soft and easy to pass. Hard stools increase your risk of hemorrhoids and are very uncomfortable.   To prevent constipation you can increase the amount of fiber in your diet. Examples of foods with fiber are leafy greens, whole grain breads, oatmeal and other grains.  It is also important to drink at least eight 8oz glass of water everyday.   If you have not has a bowel movement in 4-5 days you made need to clean out your bowel.  This will have establish normal movement through your bowel.    Miralax Clean out  Take 8 capfuls of miralax in 64 oz of gatorade. You can use any fluid that appeals to you (gatorade, water, juice)  Continue to drink at least eight 8 oz glasses of water throughout the day  You can repeat with another 8 capfuls of miralax in 64 oz of gatorade if you are not having a large amount of stools  You will need to be at home and close to a bathroom for about 8 hours when you do the above as you may need to go to the bathroom frequently.   After you are cleaned out: - Start Colace100mg  twice daily - Start Miralax once daily - Start a daily fiber supplement like metamucil or citrucel - You can safely use enemas in pregnancy  - if you are having diarrhea you can reduce to Colace once a day or miralax every other day or a 1/2 capful daily.     Chronic Constipation  Chronic constipation is a condition in which a person has three or fewer bowel movements a week, for three months or longer. This condition is especially common in older adults. The two main kinds of chronic constipation are secondary constipation and functional constipation. Secondary constipation results from another condition or a treatment. Functional constipation, also called primary or idiopathic constipation, is divided into three types:  Normal transit constipation.  In this type, movement of stool through the colon (stool transit) occurs normally.  Slow transit constipation. In this type, stool moves slowly through the colon.  Outlet constipation or pelvic floor dysfunction. In this type, the nerves and muscles that empty the rectum do not work normally. What are the causes? Causes of secondary constipation may include:  Failing to drink enough fluid, eat enough food or fiber, or get physically active.  Pregnancy.  A tear in the anus (anal fissure).  Blockage in the bowel (bowel obstruction).  Narrowing of the bowel (bowel stricture).  Having a long-term medical condition, such as: ? Diabetes. ? Hypothyroidism. ? Multiple sclerosis. ? Parkinson disease. ? Stroke. ? Spinal cord injury. ? Dementia. ? Colon cancer. ? Inflammatory bowel disease (IBD). ? Iron-deficiency anemia. ? Outward collapse of the rectum (rectal prolapse). ? Hemorrhoids.  Taking certain medicines, including: ? Narcotics. These are a certain type of prescription pain medicine. ? Antacids. ? Iron supplements. ? Water pills (diuretics). ? Certain blood pressure medicines. ? Anti-seizure medicines. ? Antidepressants. ? Medicines for Parkinson disease. The cause of functional constipation is not known, but some conditions are associated with it. These conditions include:  Stress.  Problems in the nerves and muscles that control stool transit.  Weak or impaired pelvic floor muscles. What increases the risk? You may be at higher risk for chronic constipation if you:  Are older than age 20.  Are  female.  Live in a long-term care facility.  Do not get much exercise or physical activity (have a sedentary lifestyle).  Do not drink enough fluids.  Do not eat enough food, especially fiber.  Have a long-term disease.  Have a mental health disorder or eating disorder.  Take many medicines. What are the signs or symptoms? The main symptom of chronic  constipation is having three or fewer bowel movements a week for several weeks. Other signs and symptoms may vary from person to person. These include:  Pushing hard (straining) to pass stool.  Painful bowel movements.  Having hard or lumpy stools.  Having lower belly discomfort, such as cramps or bloating.  Being unable to have a bowel movement when you feel the urge.  Feeling like you still need to pass stool after a bowel movement.  Feeling that you have something in your rectum that is blocking or preventing bowel movements.  Seeing blood on the toilet paper or in your stool.  Worsening confusion (in older adults). How is this diagnosed? This condition may be diagnosed based on:  Symptoms and medical history. You will be asked about your symptoms, lifestyle, diet, and any medicines that you are taking.  Physical exam. ? Your belly (abdomen) will be examined. ? A digital rectal exam may be done. For this exam, a health care provider places a lubricated, gloved finger into the rectum.  Other tests to check for any underlying causes of your constipation. These may be ordered if you have bleeding in your rectum, weight loss, or a family history of colon cancer. In these cases, you may have: ? Imaging studies of the colon. These may include X-ray, ultrasound, or CT scan. ? Blood tests. ? A procedure to examine the inside of your colon (colonoscopy). ? More specialized tests to check:  Whether your anal sphincter works well. This is a ring-shaped muscle that controls the closing of the anus.  How well food moves through your colon. ? Tests to measure the nerve signal in your pelvic floor muscles (electromyography). How is this treated? Treatment for chronic constipation depends on the cause. Most often, treatment starts with:  Being more active and getting regular exercise.  Drinking more fluids.  Adding fiber to your diet. Sources of fiber include fruits, vegetables, whole  grains, and fiber supplements.  Using medicines such as stool softeners or medicines that increase contractions in your digestive system (pro-motility agents).  Training your pelvic muscles with biofeedback.  Surgery, if there is obstruction. Treatment for secondary chronic constipation depends on the underlying condition. You may need to:  Stop or change some medicines if they cause constipation.  Use a fiber supplement (bulk laxative) or stool softener.  Use prescription laxative. This works by [employer] into your colon (osmotic laxative). You may also need to see a specialist who treats conditions of the digestive system (gastroenterologist). Follow these instructions at home:   Take over-the-counter and prescription medicines only as told by your health care provider.  If you are taking a laxative, take it as told by your health care provider.  Eat a balanced diet that includes enough fiber. Ask your health care provider to recommend a diet that is right for you.  Drink clear fluids, especially water. Avoid drinking alcohol, caffeine, and soda.  Drink enough fluid to keep your urine pale yellow.  Get some physical activity every day. Ask your health care provider what physical activities are safe for you.  Get colon cancer screenings  as told by your health care provider.  Keep all follow-up visits as told by your health care provider. This is important. Contact a health care provider if:  You are having three or fewer bowel movements a week.  Your stools are hard or lumpy.  You notice blood on the toilet paper or in your stool after you have a bowel movement.  You have unexplained weight loss.  You have rectum (rectal) pain.  You have stool leakage.  You experience nausea or vomiting. Get help right away if:  You have rectal bleeding or you pass blood clots.  You have severe rectal pain.  You have body tissue that pushes out (protrudes) from your  anus.  You have severe pain or bloating (distension) in your abdomen.  You have vomiting that you cannot control. Summary  Chronic constipation is a condition in which a person has three or fewer bowel movements a week, for three months or longer.  You may have a higher risk for this condition if you are an older adult, or if you do not drink enough water or get enough physical activity (are sedentary).  Treatment for this condition depends on the cause. Most treatments for chronic constipation include adding fiber to your diet, drinking more fluids, and getting more physical activity. You may also need to treat any underlying medical conditions or stop or change certain medicines if they cause constipation.  If lifestyle changes do not relieve constipation, your health care provider may recommend taking a laxative. This information is not intended to replace advice given to you by your health care provider. Make sure you discuss any questions you have with your health care provider. Document Released: 09/13/2016 Document Revised: 01/27/2017 Document Reviewed: 11/01/2016 Elsevier Patient Education  2020 [employer].

## 2019-01-10 NOTE — MAU Provider Note (Signed)
Chief Complaint:  Constipation   First Provider Initiated Contact with Patient 01/10/19 0015      HPI: Ruth Solomon is a 18 y.o. G1P0 at 23w1dwho presents to maternity admissions reporting constipation and possible impaction.  Was given instructions for several steps to take but she only did one enema and then came here.  . She denies LOF, vaginal bleeding, vaginal itching/burning, urinary symptoms, h/a, dizziness, n/v,  or fever/chills.    RN Note Pt states she has been constipated for a few weeks. Today she called the hospital and spoke to a nurse about what was going on and states she was told to do an enema at home. Pt states that she got one and gave the enema to herself at 1700. Pt now reports that she is having diarrhea from the enema, but can still feel the hard stool sitting in her bottom. Pt reports using miralax for 2 weeks prior to the enema today.  Pt states constipation is an on going issue for her even before she was pregnant  Past Medical History: Past Medical History:  Diagnosis Date  . Medical history non-contributory     Past obstetric history: OB History  Gravida Para Term Preterm AB Living  1            SAB TAB Ectopic Multiple Live Births               # Outcome Date GA Lbr Len/2nd Weight Sex Delivery Anes PTL Lv  1 Current             Past Surgical History: Past Surgical History:  Procedure Laterality Date  . FOREIGN BODY REMOVAL Left    sewing needle removed from big toe    Family History: Family History  Problem Relation Age of Onset  . Healthy Mother   . Healthy Father     Social History: Social History   Tobacco Use  . Smoking status: Never Smoker  . Smokeless tobacco: Never Used  Substance Use Topics  . Alcohol use: Never    Frequency: Never  . Drug use: Not Currently    Types: Marijuana    Comment: last use a few months ago    Allergies: No Known Allergies  Meds:  Medications Prior to Admission  Medication Sig Dispense  Refill Last Dose  . Prenatal Vit-Fe Fumarate-FA (MULTIVITAMIN-PRENATAL) 27-0.8 MG TABS tablet Take 1 tablet by mouth daily at 12 noon.   01/09/2019 at Unknown time  . famotidine (PEPCID) 20 MG tablet Take 1 tablet (20 mg total) by mouth 2 (two) times daily. 60 tablet 0   . promethazine (PHENERGAN) 25 MG tablet Take 1 tablet (25 mg total) by mouth every 6 (six) hours as needed for nausea or vomiting. If unable to keep anything down, insert vaginally 30 tablet 3     I have reviewed patient's Past Medical Hx, Surgical Hx, Family Hx, Social Hx, medications and allergies.   ROS:  Review of Systems Other systems negative  Physical Exam   Patient Vitals for the past 24 hrs:  BP Temp Pulse Resp SpO2 Weight  01/09/19 2352 112/66 98.5 F (36.9 C) (!) 108 12 99 % 58.3 kg   Constitutional: Well-developed, well-nourished female in no acute distress.  Cardiovascular: normal rate and rhythm Respiratory: normal effort, clear to auscultation bilaterally GI: Abd soft, non-tender, gravid appropriate for gestational age.   No rebound or guarding.       Did Soap suds enema without much success so did rectal  exam      Rectal exam:  No hemorrhoids   Large impacted stool    MS: Extremities nontender, no edema, normal ROM Neurologic: Alert and oriented x 4.  GU: Neg CVAT.  PELVIC EXAM: deferred   FHT:   155  Labs: No results found for this or any previous visit (from the past 24 hour(s)). --/--/O POS (08/18 0000)  Imaging:    MAU Course/MDM: Treatments in MAU included:Marland Kitchen   Attempted disimpaction without success.  Pretreated with Ativan prior to procedure.   Stool broken up by RN and then followed by Fleet enema. She did pass some more stool after that  Assessment: Pregnancy at [redacted]w[redacted]d Chronic constipation  Fecal impaction  Plan: Discharge home Rx Miralax for use at home with Dr [place] Blas regimen. Follow up in Office for prenatal visits and recheck  Encouraged to return here or to other  Urgent Care/ED if she develops worsening of symptoms, increase in pain, fever, or other concerning symptoms.   Pt stable at time of discharge.  Wynelle Bourgeois CNM, MSN Certified Nurse-Midwife 01/10/2019 12:15 AM

## 2019-03-01 DIAGNOSIS — I10 Essential (primary) hypertension: Secondary | ICD-10-CM

## 2019-03-01 HISTORY — DX: Essential (primary) hypertension: I10

## 2019-03-21 LAB — AMB REFERRAL TO OB-GYN: Glucose, 1 hour: 118

## 2019-03-25 LAB — OB RESULTS CONSOLE RPR: RPR: NONREACTIVE

## 2019-03-25 LAB — OB RESULTS CONSOLE HGB/HCT, BLOOD
HCT: 37 (ref 29–41)
Hemoglobin: 11.9

## 2019-03-25 LAB — OB RESULTS CONSOLE HIV ANTIBODY (ROUTINE TESTING): HIV: NONREACTIVE

## 2019-03-28 ENCOUNTER — Encounter: Payer: Self-pay | Admitting: *Deleted

## 2019-04-02 DIAGNOSIS — O210 Mild hyperemesis gravidarum: Secondary | ICD-10-CM

## 2019-04-10 ENCOUNTER — Encounter: Payer: Self-pay | Admitting: Lactation Services

## 2019-04-10 ENCOUNTER — Other Ambulatory Visit: Payer: Self-pay

## 2019-04-10 ENCOUNTER — Ambulatory Visit (INDEPENDENT_AMBULATORY_CARE_PROVIDER_SITE_OTHER): Payer: Medicaid Other | Admitting: Obstetrics and Gynecology

## 2019-04-10 VITALS — BP 115/77 | HR 82 | Wt 156.7 lb

## 2019-04-10 DIAGNOSIS — D696 Thrombocytopenia, unspecified: Secondary | ICD-10-CM | POA: Insufficient documentation

## 2019-04-10 DIAGNOSIS — O0993 Supervision of high risk pregnancy, unspecified, third trimester: Secondary | ICD-10-CM | POA: Insufficient documentation

## 2019-04-10 DIAGNOSIS — Z3A31 31 weeks gestation of pregnancy: Secondary | ICD-10-CM

## 2019-04-10 DIAGNOSIS — O99113 Other diseases of the blood and blood-forming organs and certain disorders involving the immune mechanism complicating pregnancy, third trimester: Secondary | ICD-10-CM

## 2019-04-10 HISTORY — DX: Supervision of high risk pregnancy, unspecified, third trimester: O09.93

## 2019-04-10 HISTORY — DX: Thrombocytopenia, unspecified: O99.113

## 2019-04-10 HISTORY — DX: Thrombocytopenia, unspecified: D69.6

## 2019-04-10 MED ORDER — BLOOD PRESSURE KIT DEVI: 1.0000 | Freq: Once | 0 refills | Status: AC

## 2019-04-10 NOTE — Progress Notes (Signed)
Prenatal Visit Note Date: 04/10/2019 Clinic: [facility]  Transfer of care visit from [facility] due to gestational thrombocytopenia (90s-100s)  Subjective:  Ruth Solomon is a 19 y.o. G1P0 at 32w0dbeing seen today for ongoing prenatal care.  She is currently monitored for the following issues for this high-risk pregnancy and has Morning sickness; Benign gestational thrombocytopenia in third trimester ([facility]; and Supervision of high risk pregnancy in third trimester on their problem list.  Patient reports no complaints.   Contractions: Irritability. Vag. Bleeding: None.  Movement: Present. Denies leaking of fluid.   The following portions of the patient's history were reviewed and updated as appropriate: allergies, current medications, past family history, past medical history, past social history, past surgical history and problem list. Problem list updated.  Objective:   Vitals:   04/10/19 1527  BP: 115/77  Pulse: 82  Weight: 156 lb 11.2 oz (71.1 kg)    Fetal Status: Fetal Heart Rate (bpm): 147   Movement: Present     General:  Alert, oriented and cooperative. Patient is in no acute distress.  Skin: Skin is warm and dry. No rash noted.   Cardiovascular: Normal heart rate noted  Respiratory: Normal respiratory effort, no problems with respiration noted  Abdomen: Soft, gravid, appropriate for gestational age. Pain/Pressure: Absent     Pelvic:  Cervical exam deferred        Extremities: Normal range of motion.  Edema: None  Mental Status: Normal mood and affect. Normal behavior. Normal judgment and thought content.   Urinalysis:      Assessment and Plan:  Pregnancy: G1P0 at 365w0d1. Benign gestational thrombocytopenia in third trimester ([facility] that may be ITP [facility] which was sen she was around 13-14wks [facility].  Recheck cbc q1-2wks. Heme referral made and anatomy/growth  u/s ordered.  - CBC - Pathologist smear review - HIV antibody (with reflex) - Comprehensive metabolic panel - Protein / creatinine ratio, urine - [place] OB DETAIL +14 WK; Future - Ambulatory referral to Hematology  2. Supervision of high risk pregnancy in third trimester 1h GTT, rpr normal - CBC - Pathologist smear review - HIV antibody (with reflex) - Comprehensive metabolic panel - Protein / creatinine ratio, urine - [place]FM OB DETAIL +14 WK; Future - CHL AMB BABYSCRIPTS SCHEDULE OPTIMIZATION - Blood Pressure Monitoring (BLOOD PRESSURE KIT) DEVI; 1 Device by Does not apply route once for 1 dose. Dx O09.90  Dispense: 1 each; Refill: 0  Preterm labor symptoms and general obstetric precautions including but not limited to vaginal bleeding, contractions, leaking of fluid and fetal movement were reviewed in detail with the patient. Please refer to After Visit Summary for other counseling recommendations.  Return in about 10 days (around 04/20/2019) for 10-14d high risk, in person.   PiAletha HalimMD

## 2019-04-10 NOTE — Progress Notes (Signed)
Spoke with pt while in the office about obtaining a pump. . She reports she plans to BF infant. Discussed obtaining manual pump in the hospital and working with [facility] to rent a pump when returning to work. Pt voiced understanding.

## 2019-04-11 ENCOUNTER — Telehealth: Payer: Self-pay | Admitting: Hematology and Oncology

## 2019-04-11 LAB — CBC
Hematocrit: 33.9 % — ABNORMAL LOW (ref 34.0–46.6)
Hemoglobin: 11.6 g/dL (ref 11.1–15.9)
MCH: 27.9 pg (ref 26.6–33.0)
MCHC: 34.2 g/dL (ref 31.5–35.7)
MCV: 82 fL (ref 79–97)
Platelets: 92 10*3/uL — CL (ref 150–450)
RBC: 4.16 x10E6/uL (ref 3.77–5.28)
RDW: 12.3 % (ref 11.7–15.4)
WBC: 10.1 10*3/uL (ref 3.4–10.8)

## 2019-04-11 LAB — PROTEIN / CREATININE RATIO, URINE
Creatinine, Urine: 109.2 mg/dL
Protein, Ur: 11.8 mg/dL
Protein/Creat Ratio: 108 mg/g creat (ref 0–200)

## 2019-04-11 LAB — COMPREHENSIVE METABOLIC PANEL
ALT: 7 IU/L (ref 0–32)
AST: 12 IU/L (ref 0–40)
Albumin/Globulin Ratio: 1.4 (ref 1.2–2.2)
Albumin: 3.4 g/dL — ABNORMAL LOW (ref 3.9–5.0)
Alkaline Phosphatase: 101 IU/L (ref 39–117)
BUN/Creatinine Ratio: 13 (ref 9–23)
BUN: 7 mg/dL (ref 6–20)
Bilirubin Total: <0.2 mg/dL (ref 0.0–1.2)
CO2: 22 mmol/L (ref 20–29)
Calcium: 9.2 mg/dL (ref 8.7–10.2)
Chloride: 105 mmol/L (ref 96–106)
Creatinine, Ser: 0.52 mg/dL — ABNORMAL LOW (ref 0.57–1.00)
GFR calc Af Amer: 158 mL/min/{1.73_m2} (ref >59)
GFR calc non Af Amer: 137 mL/min/{1.73_m2} (ref >59)
Globulin, Total: 2.4 g/dL (ref 1.5–4.5)
Glucose: 70 mg/dL (ref 65–99)
Potassium: 4.4 mmol/L (ref 3.5–5.2)
Sodium: 139 mmol/L (ref 134–144)
Total Protein: 5.8 g/dL — ABNORMAL LOW (ref 6.0–8.5)

## 2019-04-11 LAB — HIV ANTIBODY (ROUTINE TESTING W REFLEX): HIV Screen 4th Generation wRfx: NONREACTIVE

## 2019-04-11 NOTE — Telephone Encounter (Signed)
Received a new hem referral from Ruth Solomon for thrombocytopenia in pregnancy. Ruth Solomon has been cld and scheduled to see Ruth Solomon on 2/18 at 2pm. Pt aware to arrive 15 minutes early.

## 2019-04-18 ENCOUNTER — Other Ambulatory Visit: Payer: Medicaid Other

## 2019-04-18 ENCOUNTER — Telehealth: Payer: Self-pay | Admitting: *Deleted

## 2019-04-18 ENCOUNTER — Encounter: Payer: Medicaid Other | Admitting: Hematology and Oncology
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
## 2019-04-22 ENCOUNTER — Other Ambulatory Visit: Payer: Self-pay

## 2019-04-22 ENCOUNTER — Ambulatory Visit (INDEPENDENT_AMBULATORY_CARE_PROVIDER_SITE_OTHER): Payer: Medicaid Other | Admitting: Family Medicine

## 2019-04-22 VITALS — BP 123/73 | HR 100 | Wt 156.0 lb

## 2019-04-22 DIAGNOSIS — O0993 Supervision of high risk pregnancy, unspecified, third trimester: Secondary | ICD-10-CM

## 2019-04-22 DIAGNOSIS — Z3A32 32 weeks gestation of pregnancy: Secondary | ICD-10-CM

## 2019-04-22 DIAGNOSIS — D696 Thrombocytopenia, unspecified: Secondary | ICD-10-CM

## 2019-04-22 DIAGNOSIS — O99113 Other diseases of the blood and blood-forming organs and certain disorders involving the immune mechanism complicating pregnancy, third trimester: Secondary | ICD-10-CM

## 2019-04-22 NOTE — Patient Instructions (Signed)
AREA PEDIATRIC/FAMILY PRACTICE PHYSICIANS  [place] ([address]) . [facility] o Chambliss, MD; Eniola, MD; Hale, MD; Hensel, MD; McDiarmid, MD; McIntyer, MD; Neal, MD; Walden, MD o [address] [place], [place] [address] o [phone] o Mon-Fri 8:30-12:30, 1:30-5:00 o Providers come to see babies at [facility] o Accepting Medicaid . [facility] o Limited providers who accept newborns: Koirala, MD; Morrow, MD; Wolters, MD o [address], [place], [place] [address] o [phone] o Mon-Fri 8:00-5:30 o Babies seen by providers at [facility] o Does NOT accept Medicaid o Please call early in hospitalization for appointment (limited availability)  . [facility] o Mulberry, MD o [address] [place], [place] [address] o [phone] o Mon, Tue, Thur, Fri 8:30-5:00, Wed 10:00-7:00 (closed 1-2pm) o Babies seen by [facility] providers o Accepting Medicaid . [facility] o Rubin, MD o [address], [place], [place] [address] o [phone] o Mon-Fri 8:30-5:00, Sat 8:30-12:00 o Provider comes to see babies at [facility] o Accepting Medicaid o Must have been referred from current patients or contacted office prior to delivery . [facility] ([facility]) o Brown, MD; Chandler, MD; Ettefagh, MD; Grant, MD; Lester, MD; McCormick, MD; McQueen, MD; Prose, MD; Simha, MD; Stanley, MD; Stryffeler, NP; Tebben, NP o [address], [place], [place] [address] o [phone] o Mon, Tue, Thur, Fri 8:30-5:30, Wed 9:30-5:30, Sat 8:30-12:30 o Babies seen by [facility] providers o Accepting Medicaid o Only accepting infants of first-time parents or siblings of current patients o Hospital discharge coordinator will make follow-up appointment . Ruth Solomon o [address],  [place], [place]  [address] o [phone]   Fax - [phone] . [facility] o [address], [place], [place]  [address] o Phone - [phone]   Fax - [phone] . Shilpa Gosrani o [address], [place], [place]  [address] o [phone]  [place] ([address]) . [facility] o Bates, MD; Brassfield, MD; Cooper, Cox, MD; MD; Davis, MD; Dovico, MD; Ettefaugh, MD; Little, MD; Lowe, MD; Keiffer, MD; Melvin, MD; Sumner, MD; Williams, MD o [address], [place], [place] [address] o [phone] o Mon-Fri 8:30-5:00 (extended evenings Mon-Thur as needed), Sat-Sun 10:00-1:00 o Providers come to see babies at [facility] o Accepting Medicaid for families of first-time babies and families with all children in the household age 3 and under. Must register with office prior to making appointment (M-F only). . [facility] o Henson, NP; Knapp, MD; Lalonde, MD; Tysinger, PA o [address] [place],  [address] o [phone] o Mon-Fri 8:00-5:00 o Babies seen by providers at [facility] o Does NOT accept Medicaid/Commercial Insurance Only . [facility] ([facility])  o Artis, MD; Barnes, MD; Bratton, MD; Coccaro, MD; Lockett Gardner, MD; Kramer, MD; Marshall, MD; Netherton, MD; Poleto, MD; Skinner, MD o [address] [place],  [address] o [phone] o Mon-Fri 8:30-5:30, Sat (Oct.-Mar.) 9:00-1:00 o Babies seen by providers at [facility] o Accepting Medicaid  [place] ([address]) . [facility] o Reid, MD; Warner, MD o [address], ,  [address] o [phone] o Mon-Fri 8:30-5:00, Sat 8:30-12:00 o Providers come to see babies at [facility] o Does NOT accept Medicaid . [facility] o Becker, PA; Hagler, MD; Scifres, PA; Sun, MD; Swayne, MD o [address],  [place], [place] [address] o [phone] o Mon-Fri 8:00-5:00 o Babies seen by providers at [facility] o Does NOT accept Medicaid o Only accepting babies of parents who are patients o Please call early in hospitalization for appointment (limited availability) . [facility] o Clark, MD; Frye, MD; Kelleher, MD; Mack, NP; Miller, MD; O'Keller, MD; Patterson, NP; Pudlo, MD; Puzio, MD; Thomas, MD; Tucker, MD; Twiselton, MD o [address], [place], [place] [address] o [phone] o Mon-Fri 8:00-5:00, Sat 9:00-12:00 o Providers come to see babies at [facility] o Does NOT accept Medicaid  [place] ([address]) . [facility] o Limited providers accepting new patients: Brake, NP; Wharton, PA o [address], [place], [place] [address] o [phone] o Mon-Fri 8:00-5:00 o Babies seen by providers at [facility] o Does NOT accept Medicaid o Only accepting babies of parents who are patients o Please call early in hospitalization for appointment (limited availability) . [facility] o Gay, MD; Quinlan, MD o [address] [place], [place] [address] o [phone] (press 1 to schedule appointment) o Mon-Fri 8:00-5:00 o Providers come to see babies at [facility] o Does NOT accept Medicaid . [facility] o Mazer, MD o [address] [place], [place] [address] o [phone] o Mon-Fri 8:30-5:00 (lunch 12:30-1:00), extended hours by appointment only Wed 5:00-6:30 o Babies seen by [facility] providers o Accepting Medicaid . [facility] o Banks, MD; Jordan, MD; Koberlein, MD o [address], [place], [place] [address] o [phone] o Mon-Fri 8:00-5:00 o Babies seen by [facility] providers o Does NOT accept Medicaid . [facility] o Parker, MD; Hunter, MD; Wallace, DO o [address] [place], [place]  [address] o [phone] o Mon-Fri 8:00-5:00 o Babies seen by [facility] providers o Does NOT accept Medicaid . [facility] o [name], PA; Brecken, PA; Christy, NP; Dees, MD; DeClaire, MD; DeWeese, MD; Hansen, NP; Mills, NP; Parrish, NP; Smoot, NP; Summer, MD; Vapne, MD o [address] [place], [place] [address] o [phone] o Mon-Fri 8:30-5:00, Sat 10:00-1:00 o Providers come to see babies at [facility] o Does NOT accept Medicaid o Free prenatal information session Tuesdays at 4:45pm . [facility] o Bouska, MD; Gordon, PA; Jeffery, PA; Weber, PA o [address] [place] [address] o [phone] o Mon-Fri 7:30-5:30 o Babies seen by [facility] providers . [facility] o [address], [place], [place]  [address] o [phone]   Fax - [phone]  [place] ([address] & [address]) . [facility] o [place], [place] o [address] [place], [place] [address] o [phone] o Mon-Thur 8:00-6:00 o Providers come to see babies at [facility] o Accepting Medicaid . [facility] o Anderson, NP; Badger, MD; Beal, PA; Spencer, PA o [address] [place], [place] [address] o [phone] o Mon-Thur 7:30-7:30, Fri 7:30-4:30 o Babies seen by [facility] providers o Accepting Medicaid . [facility] o Agbuya, MD; Klett, NP; Romgoolam, MD o [address], [place],  [address] o [phone] o Mon-Fri 8:30-5:00, Sat 8:30-12:00 o Providers come to see babies at [facility] o Accepting Medicaid o Must have "Meet & Greet" appointment at office prior to delivery . [facility] o McCord,   MD; Wallace, MD; Wood, MD o [address], [place], [place] [address] o [phone] o Mon-Wed 8:00-6:00, Thur-Fri 8:00-5:00, Sat 9:00-12:00 o Providers come to  see babies at [facility] o Does NOT accept Medicaid o Only accepting siblings of current patients . [facility]  o [address], [place], [place]  [address] o [phone]   Fax - [phone] . [facility] o [address], [place], [place]  [address] o [phone]   Fax - [phone]  [place] ([address] & [address]) . [facility] o Cirigliano, DO; Matthews, DO o [address] [place], [place] [address] o [phone] o Mon-Fri 7:00-5:00 o Babies seen by [facility] providers o Does NOT accept Medicaid . [facility] o Briscoe, MD; Howley, PA; Moreira, PA o [address], [place], [place] [address] o [phone] o Mon-Fri 8:00-5:00 o Babies seen by [facility] providers o Accepting Medicaid . [facility] o Boyd, MD; Church, PA; Jones, NP; Osborn, PA o [address], [place], [place] [address] o [phone] o Mon-Fri 8:00-5:00 o Babies seen by providers at [facility] o Accepting Medicaid  [place] ([address]) . [facility] [place] o Wendling, DO o [address] [place], [place] [address] o [phone] o Mon-Fri 8:00-5:00 o Babies seen by [facility] providers o Does NOT accept Medicaid o Limited availability, please call early in hospitalization to schedule follow-up . [facility] o Calderon, PA; Cummings, MD; Dillard, MD; Martin, PA; Olson, MD; VanDeven, PA o [address], [place], [place] [address] o [phone] o Mon-Fri 8:30-5:00, Sat 9:00-12:00 o Babies seen by providers at [facility] o Accepting Medicaid o Please register online then schedule online or call office o [email or web address] . [facility] ([facility] o Hunter, NP; Kumar, MD; Martin Rogers, PA o [address], [place], [place] [address] o [phone] o Mon-Fri 8:00-5:00 o Babies seen by providers at [facility] o Accepting Medicaid . [facility] ([facility]) o Glandorf, MD; Kristi Fleenor, NP; West, MD o [address], [place], [place] [address] o [phone] o Mon-Fri 8:00-5:30, Sat&Sun by appointment (phones open at 8:30) o Babies seen by [facility] providers o Accepting Medicaid o Must be a first-time baby or sibling of current patient . [facility] [place]  o [address], [place], [place]  [address] o [phone]   Fax - [phone]  [place] ([address]) . [facility] o Brown, PA; Cowen, PA; Rice, MD; Helton, PA; Spry, MD o [address] [place], [place] [address] o [phone] o Mon-Thur 8:00-7:00, Fri 8:00-5:00, Sat 8:00-12:00, Sun 9:00-12:00 o Babies seen by [facility] providers o Accepting Medicaid . [facility] [place] o Coe-Goins, MD; Marshall, MD; Pierre-Louis, MD o [address] [place] [address], [place],  [address] o [phone] o Mon-Thur 8:00-5:00 o Babies seen by providers at [facility] o Accepting Medicaid . [facility] o Bratton, MD; Coe-Goins, MD; Hayes, MD; Lewis, MD; List, MD; Lott, MD; Marshall, MD; Moran, MD; O'Neal, MD; Pierre-Louis, MD; Pitonzo, MD; Scholer, MD; Spangle, MD o [address] [place],    [address] o [phone] o Mon-Fri 8:00-5:30, Sat (Oct.-Mar.) 9:00-1:00 o Babies seen by providers at [facility] o Accepting Medicaid o Must fill out new patient packet, available online at [email or web address] . [facility] ([facility] o Friddle, NP; Harris, NP; Kelly, NP; Logan, MD;  Melvin, PA; Poth, MD; Ramadoss, MD; Stanton, NP o [address], [place], [place] [address] o [phone] o Mon-Thur 8:00-5:30, Fri 8:00-5:00 o Babies seen by providers at [facility] o Accepting Medicaid  [place] ([address]) . [facility] o Dixon, PA; Salem, MD; Pickard, MD; Tapia, PA o [address], [place], [place] [address] o [phone] o Mon-Fri 8:00-5:00 o Babies seen by providers at [facility] o Accepting Medicaid   [place] ([address]) . [facility] [place] o Masneri, DO; Meyers, MD; Nelson, PA o [address], [place], [place] [address] o [phone] o Mon-Fri 8:00-5:00 o Babies seen by providers at [facility] o Does NOT accept Medicaid o Limited appointment availability, please call early in hospitalization  . [facility] [place] o Kunedd, DO; McGowen, MD o [address], [place], [place] [address] o [phone] o Mon-Fri 8:00-5:00 o Babies seen by [facility] providers o Does NOT accept Medicaid . [facility] [place] o Cameron, MD; MacDonald, MD; Michaels, PA; Nayak, MD o [address] [place] [address], [place], [place] [address] o [phone] o Mon-Fri 8:00-5:00 o After hours clinic ([address] [place], [place] [address]) [phone] Mon-Fri 5:00-8:00, Sat 12:00-6:00, Sun 10:00-4:00 o Babies seen by [facility] providers o Accepting Medicaid . [facility] [place] o [address], [place], [place]  [address] o [phone]   Fax - [phone]  [place] ([address]) . [facility] [place] o Andy, MD o [address], [place], [place] [address] o [phone] o Mon-Fri 8:00-5:00 o Babies seen by [facility] providers o Does NOT accept Medicaid . [facility] [place] ([facility] [place]) o Eksir, MD o [address], [place], [place] [address] [place]  [address] o [phone] o Mon-Thur 8:00-7:00, Fri 8:00-5:00, Sat 8:00-12:00 o Babies seen by providers at [facility] o Accepting Medicaid - but does not have vaccinations in office (must be received elsewhere) o Limited availability, please call early in hospitalization  [place] ([address]) . [facility]  o Charlene Flemming, MD o [address], [place]  [address] o [phone]  Fax [phone]   

## 2019-04-22 NOTE — Progress Notes (Signed)
   PRENATAL VISIT NOTE  Subjective:  Ruth Solomon is a 19 y.o. G1P0 at [redacted]w[redacted]d being seen today for ongoing prenatal care.  She is currently monitored for the following issues for this high-risk pregnancy and has Morning sickness; Benign gestational thrombocytopenia in third trimester [facility]); and Supervision of high risk pregnancy in third trimester on their problem list.  Patient reports occasional contractions.  Contractions: Irritability.  .  Movement: Present. Denies leaking of fluid.   The following portions of the patient's history were reviewed and updated as appropriate: allergies, current medications, past family history, past medical history, past social history, past surgical history and problem list.   Objective:   Vitals:   04/22/19 1557  BP: 123/73  Pulse: 100  Weight: 156 lb (70.8 kg)    Fetal Status: Fetal Heart Rate (bpm): 143 Fundal Height: 33 cm Movement: Present     General:  Alert, oriented and cooperative. Patient is in no acute distress.  Skin: Skin is warm and dry. No rash noted.   Cardiovascular: Normal heart rate noted  Respiratory: Normal respiratory effort, no problems with respiration noted  Abdomen: Soft, gravid, appropriate for gestational age.  Pain/Pressure: Present     Pelvic: Cervical exam deferred        Extremities: Normal range of motion.  Edema: None  Mental Status: Normal mood and affect. Normal behavior. Normal judgment and thought content.   Assessment and Plan:  Pregnancy: G1P0 at [redacted]w[redacted]d 1. Supervision of high risk pregnancy in third trimester FHT and FH normal  2. Benign gestational thrombocytopenia in third trimester [facility]) Has hematology appt on Wednesday  Preterm labor symptoms and general obstetric precautions including but not limited to vaginal bleeding, contractions, leaking of fluid and fetal movement were reviewed in detail with the patient. Please refer to After Visit Summary for other counseling recommendations.   Return  in about 2 weeks (around 05/06/2019) for HR OB f/u.  Future Appointments  Date Time Provider Department Center  04/24/2019  1:00 PM Jaci Standard, MD [facility] None  04/24/2019  2:15 PM [facility] LAB 4 [facility] None  04/26/2019  2:15 PM [facility] NURSE [facility]  04/26/2019  2:15 PM [facility] [place] 4 [facility]-US    Levie Heritage, DO

## 2019-04-23 NOTE — Progress Notes (Signed)
[facility] [phone]   [phone]  INITIAL CONSULT NOTE  Patient Care Team: Patient, No Pcp Per as PCP - General (General Practice)  Hematological/Oncological History #Thrombocytopenia in Pregnancy 1) 10/15/2018: WBC 8.7, hgb 13.2, MCV 84.1, Plt 109 2) 12/20/2018: WBC 9.4, Hgb 12.6, MCV 78, Plt 102 3) 04/10/2019: WBC 10.1, Hgb 11.6, MCV 82, Plt 92. Noted to be [redacted] weeks pregnant 4) 04/24/2019: establish care with Dr. Leonides Schanz   CHIEF COMPLAINTS/PURPOSE OF CONSULTATION:  "Thrombocytopenia in Pregnancy "  HISTORY OF PRESENTING ILLNESS:  Ruth Solomon 19 y.o. female with no significant past medical history who presents for evaluation of thrombocytopenia in pregnancy.   On review of the previous records Ruth Solomon was last seen on 04/10/2019 at which time she had a CBC performed which showed a white blood cell count of 10.1, hemoglobin 11.6, MCV of 82, and a platelet count of 92.  She was reported to be [redacted] weeks pregnant at that time.  She had prior CBCs on record which were also drawn while pregnant.  In August 2020 she was found to have a platelet count of 109 with normal white blood cell count and hemoglobin levels.  After that on 12/20/2018 the patient was found to have a white blood cell count of 9.4, hemoglobin 12.6, and a platelet count of 102.  Due to concern for this thrombocytopenia in pregnancy the patient was referred to hematology for further evaluation and management.  On exam today Ruth Solomon reports that she feels well.  She notes that her pregnancy has not been complicated by any morning sickness, spotting, or other health issues.  She reports that she has gained approximately 20 to 30 pounds during the course of her pregnancy and that she is currently at 33 weeks.  She notes that her due date is 06/12/2019 and that there are currently plans for a natural delivery.  On further discussion she notes that she is not had any issues with  bleeding, bruising, spotting, or dark stools.  She notes that she has not had any issues with fevers, chills, sweats.  She does note that she was admitted to the hospital for constipation approximately 6 years ago but has not had any other major medical conditions, surgeries, or hospitalizations.  Her family history is remarkable for a mother with sickle cell trait but otherwise no remarkable past medical history.  She notes that she previously smoked from approximately 2017 until 2 months before she became pregnant.  She notes that she has never been 1 to drink alcohol and that she eats a well-rounded diet consisting of a variety of meats, fruits, and vegetables.  A full 10 point ROS is listed below.  MEDICAL HISTORY:  Past Medical History:  Diagnosis Date  . Medical history non-contributory     SURGICAL HISTORY: Past Surgical History:  Procedure Laterality Date  . FOREIGN BODY REMOVAL Left    sewing needle removed from big toe    SOCIAL HISTORY: Social History   Socioeconomic History  . Marital status: Single    Spouse name: Not on file  . Number of children: Not on file  . Years of education: Not on file  . Highest education level: Not on file  Occupational History  . Not on file  Tobacco Use  . Smoking status: Never Smoker  . Smokeless tobacco: Never Used  Substance and Sexual Activity  . Alcohol use: Never  . Drug use: Not Currently    Types: Marijuana  Comment: last use a few months ago  . Sexual activity: Not Currently    Birth control/protection: None  Other Topics Concern  . Not on file  Social History Narrative  . Not on file   Social Determinants of Health   Financial Resource Strain:   . Difficulty of Paying Living Expenses: Not on file  Food Insecurity:   . Worried About Charity fundraiser in the Last Year: Not on file  . Ran Out of Food in the Last Year: Not on file  Transportation Needs:   . Lack of Transportation (Medical): Not on file  . Lack of  Transportation (Non-Medical): Not on file  Physical Activity:   . Days of Exercise per Week: Not on file  . Minutes of Exercise per Session: Not on file  Stress:   . Feeling of Stress : Not on file  Social Connections:   . Frequency of Communication with Friends and Family: Not on file  . Frequency of Social Gatherings with Friends and Family: Not on file  . Attends Religious Services: Not on file  . Active Member of Clubs or Organizations: Not on file  . Attends Archivist Meetings: Not on file  . Marital Status: Not on file  Intimate Partner Violence:   . Fear of Current or Ex-Partner: Not on file  . Emotionally Abused: Not on file  . Physically Abused: Not on file  . Sexually Abused: Not on file    FAMILY HISTORY: Family History  Problem Relation Age of Onset  . Healthy Mother   . Healthy Father     ALLERGIES:  has No Known Allergies.  MEDICATIONS:  Current Outpatient Medications  Medication Sig Dispense Refill  . docusate sodium (COLACE) 100 MG capsule Take 1 capsule (100 mg total) by mouth 2 (two) times daily as needed. 30 capsule 2  . famotidine (PEPCID) 20 MG tablet Take 1 tablet (20 mg total) by mouth 2 (two) times daily. (Patient not taking: Reported on 04/10/2019) 60 tablet 0  . polyethylene glycol (MIRALAX) 17 g packet Take 17 g by mouth daily. 14 each 0  . Prenatal Vit-Fe Fumarate-FA (MULTIVITAMIN-PRENATAL) 27-0.8 MG TABS tablet Take 1 tablet by mouth daily at 12 noon.    . promethazine (PHENERGAN) 25 MG tablet Take 1 tablet (25 mg total) by mouth every 6 (six) hours as needed for nausea or vomiting. If unable to keep anything down, insert vaginally (Patient not taking: Reported on 04/10/2019) 30 tablet 3   No current facility-administered medications for this visit.    REVIEW OF SYSTEMS:   Constitutional: ( - ) fevers, ( - )  chills , ( - ) night sweats Eyes: ( - ) blurriness of vision, ( - ) double vision, ( - ) watery eyes Ears, nose, mouth, throat,  and face: ( - ) mucositis, ( - ) sore throat Respiratory: ( - ) cough, ( - ) dyspnea, ( - ) wheezes Cardiovascular: ( - ) palpitation, ( - ) chest discomfort, ( - ) lower extremity swelling Gastrointestinal:  ( - ) nausea, ( - ) heartburn, ( - ) change in bowel habits Skin: ( - ) abnormal skin rashes Lymphatics: ( - ) new lymphadenopathy, ( - ) easy bruising Neurological: ( - ) numbness, ( - ) tingling, ( - ) new weaknesses Behavioral/Psych: ( - ) mood change, ( - ) new changes  All other systems were reviewed with the patient and are negative.  PHYSICAL EXAMINATION: ECOG PERFORMANCE STATUS:  0 - Asymptomatic  Vitals:   04/24/19 1309  BP: 112/62  Pulse: 77  Resp: 18  Temp: 98.5 F (36.9 C)  SpO2: 100%   Filed Weights   04/24/19 1309  Weight: 156 lb 9.6 oz (71 kg)    GENERAL: well appearing young African American female in NAD  SKIN: skin color, texture, turgor are normal, no rashes or significant lesions EYES: conjunctiva are pink and non-injected, sclera clear LUNGS: clear to auscultation and percussion with normal breathing effort HEART: regular rate & rhythm and no murmurs and no lower extremity edema ABDOMEN: gravid uterus.  Musculoskeletal: no cyanosis of digits and no clubbing  PSYCH: alert & oriented x 3, fluent speech NEURO: no focal motor/sensory deficits  LABORATORY DATA:  I have reviewed the data as listed CBC Latest Ref Rng & Units 04/24/2019 04/10/2019 03/25/2019  WBC 4.0 - 10.5 K/uL 10.1 10.1 -  Hemoglobin 12.0 - 15.0 g/dL 62.2 29.7 98.9  Hematocrit 36.0 - 46.0 % 36.9 33.9(L) 37  Platelets 150 - 400 K/uL 87(L) 92(LL) -    CMP Latest Ref Rng & Units 04/24/2019 04/10/2019 10/15/2018  Glucose 70 - 99 mg/dL 71 70 92  BUN 6 - 20 mg/dL 6 7 8   Creatinine 0.44 - 1.00 mg/dL 2.11) 9.41(D  Sodium 135 - 145 mmol/L 139 139 135  Potassium 3.5 - 5.1 mmol/L 4.2 4.4 3.8  Chloride 98 - 111 mmol/L 106 105 104  CO2 22 - 32 mmol/L 25 22 22   Calcium 8.9 - 10.3 mg/dL 8.9  9.2 9.5  Total Protein 6.5 - 8.1 g/dL 6.4(L) 5.8(L) 6.8  Total Bilirubin 0.3 - 1.2 mg/dL 4.08) 0.7  Alkaline Phos 38 - 126 U/L 128(H) 101 58  AST 15 - 41 U/L 14(L) 12 16  ALT 0 - 44 U/L 7 7 9    PATHOLOGY: None relevant to review.   BLOOD FILM:  Review of the peripheral blood smear showed normal appearing white cells with neutrophils that were appropriately lobated and granulated. There was no predominance of bi-lobed or hyper-segmented neutrophils appreciated. No Dohle bodies were noted. There was no left shifting, immature forms or blasts noted. Lymphocytes remain normal in size without any predominance of large granular lymphocytes. Red cells show no anisopoikilocytosis, macrocytes , microcytes or polychromasia. There were no schistocytes, target cells, echinocytes, acanthocytes, dacrocytes, or stomatocytes.There was no rouleaux formation, nucleated red cells, or intra-cellular inclusions noted. The platelets are without any clumping evident, though several large platelets are noted.   RADIOGRAPHIC STUDIES: None relevant to review.  No results found.  ASSESSMENT & PLAN Ruth Solomon 20 y.o. female with no significant past medical history who presents for evaluation of thrombocytopenia in pregnancy.  After review the labs and discussion with the patient her findings are most consistent with a benign gestational thrombocytopenia pregnancy.  She currently has no findings that are alarming for other underlying conditions.  She has tested negative previously for hepatitis B and HIV, and we will complete that work-up with a hepatitis C serology today.  Additionally we will sure that her liver function is normal with a PT/INR and PTT.  A review of the peripheral blood film did not show any platelet clumping, though several large platelets were noted which is a common finding in pregnancy.  Regarding for thrombocytopenia it is currently mild to moderate in nature with her last platelet count  being 92.  A platelet count of 92 is adequate for epidural administration (recommended level 70K (Anesthesiology. 2017 Jun;126(6):1053-1063.)) and  delivery (recommended level 50K).  Given these findings I do not anticipate that she will require a platelet transfusion prior to delivery.  The patient did note today that she was not interested in receiving an epidural though I encouraged her to reconsider.  In order to confirm the diagnosis of gestational thrombocytopenia we will have the patient follow-up in 6 weeks postdelivery to assure that her platelet counts have subsequently normalized.  In the event that her platelets were to drop considerably prior to delivery please consult the hematology service for further recommendations.  It would be reasonable to administer platelets in consideration of steroid therapy in order to raise the platelets if they drop <70k.  03/25/2019: tested negative for HIV antibody and Hep B surface antigen   #Gestational Thrombocytopenia in 3rd Trimester --today will order CBC, CMP, and review peripheral blood film --assure normal liver health with PT/INR, PTT, and Hep C antibody --nutritional evaluation with Vitamin B12, folate, MMA, and homocysteine --Platelet count of >70k in appropriate for epidural (Anesthesiology. 2017 Jun;126(6):1053-1063.). Additionally Plt count >50k should be adequate for delivery. Can transfuse platelets if necessary pre-procedure. --at this time findings are most consistent with a benign gestational thrombocytopenia --we will have patient f/u in our clinic 6 weeks after delivery to assure Plt count has normalized. If patient were to have concerning drops in platelets (<70k) on admission please contact our service.  --RTC 6 weeks post delivery to assure platelet levels normalize.   Orders Placed This Encounter  Procedures  . CBC with Differential (Cancer Center Only)    Standing Status:   Future    Number of Occurrences:   1    Standing  Expiration Date:   04/23/2020  . Immature Platelet Fraction    Standing Status:   Future    Number of Occurrences:   1    Standing Expiration Date:   04/23/2020  . Platelet by Citrate    Standing Status:   Future    Number of Occurrences:   1    Standing Expiration Date:   04/23/2020  . CMP (Cancer Center only)    Standing Status:   Future    Number of Occurrences:   1    Standing Expiration Date:   04/23/2020  . Save Smear (SSMR)    Standing Status:   Future    Number of Occurrences:   1    Standing Expiration Date:   04/23/2020  . Protime-INR    Standing Status:   Future    Number of Occurrences:   1    Standing Expiration Date:   04/23/2020  . APTT    Standing Status:   Future    Number of Occurrences:   1    Standing Expiration Date:   04/23/2020  . Hepatitis C antibody    Standing Status:   Future    Number of Occurrences:   1    Standing Expiration Date:   04/23/2020  . Vitamin B12    Standing Status:   Future    Number of Occurrences:   1    Standing Expiration Date:   04/23/2020  . Folate, Serum    Standing Status:   Future    Number of Occurrences:   1    Standing Expiration Date:   04/23/2020  . Methylmalonic acid, serum    Standing Status:   Future    Number of Occurrences:   1    Standing Expiration Date:   04/23/2020  . Homocysteine,  serum    Standing Status:   Future    Number of Occurrences:   1    Standing Expiration Date:   04/23/2020    All questions were answered. The patient knows to call the clinic with any problems, questions or concerns.  A total of more than 60 minutes were spent on this encounter and over half of that time was spent on counseling and coordination of care as outlined above.   Ulysees Barns, MD Department of Hematology/Oncology [facility] Phone: [phone] Pager: [phone] Email: [email or web address]  04/24/2019 6:00 PM   Literature Support:  1) Primitivo Gauze BT, [place],  Klumpner TT, [place], [place], Hand KW, Elaina Pattee, Goodier CG, Ethel Rana ME; [employer] Investigators. Risk of Epidural Hematoma after Neuraxial Techniques in Thrombocytopenic Parturients: A Report from the [employer]. Anesthesiology. 2017 Jun;126(6):1053-1063. doi: 10.1097/ALN.0000000000001630. PMID: 83662947; PMCID: [id number].  --The upper bound of the 95% CI for the risk of epidural hematoma for a platelet count of 0 to 49,000 mm is 11%, for 50,000 to 69,000 mm is 3%, and for 70,000 to 100,000 mm is 0.2%.  2) Jamie Kato, Antonella S, Albana C. Platelets in pregnancy. J Prenat Med. 2011;5(4):90-92.  --Normal pregnancy is characterized by an increase in platelet aggregation and a decrease in the number of circulating platelets with gestation. Platelet lifespan declines and the MPV increases minimally during pregnancy

## 2019-04-24 ENCOUNTER — Encounter: Payer: Self-pay | Admitting: Hematology and Oncology

## 2019-04-24 ENCOUNTER — Inpatient Hospital Stay: Payer: Medicaid Other

## 2019-04-24 ENCOUNTER — Other Ambulatory Visit: Payer: Self-pay

## 2019-04-24 ENCOUNTER — Inpatient Hospital Stay: Payer: Medicaid Other | Attending: Hematology and Oncology | Admitting: Hematology and Oncology

## 2019-04-24 VITALS — BP 112/62 | HR 77 | Temp 98.5°F | Resp 18 | Ht 62.0 in | Wt 156.6 lb

## 2019-04-24 DIAGNOSIS — Z3A31 31 weeks gestation of pregnancy: Secondary | ICD-10-CM | POA: Diagnosis not present

## 2019-04-24 DIAGNOSIS — Z79899 Other long term (current) drug therapy: Secondary | ICD-10-CM | POA: Diagnosis not present

## 2019-04-24 DIAGNOSIS — D696 Thrombocytopenia, unspecified: Secondary | ICD-10-CM

## 2019-04-24 DIAGNOSIS — D6959 Other secondary thrombocytopenia: Secondary | ICD-10-CM | POA: Diagnosis not present

## 2019-04-24 DIAGNOSIS — O99113 Other diseases of the blood and blood-forming organs and certain disorders involving the immune mechanism complicating pregnancy, third trimester: Secondary | ICD-10-CM | POA: Insufficient documentation

## 2019-04-24 LAB — CBC WITH DIFFERENTIAL (CANCER CENTER ONLY)
Abs Immature Granulocytes: 0.2 10*3/uL — ABNORMAL HIGH (ref 0.00–0.07)
Basophils Absolute: 0 10*3/uL (ref 0.0–0.1)
Basophils Relative: 0 %
Eosinophils Absolute: 0.1 10*3/uL (ref 0.0–0.5)
Eosinophils Relative: 1 %
HCT: 36.9 % (ref 36.0–46.0)
Hemoglobin: 12 g/dL (ref 12.0–15.0)
Immature Granulocytes: 2 %
Lymphocytes Relative: 16 %
Lymphs Abs: 1.6 10*3/uL (ref 0.7–4.0)
MCH: 27.2 pg (ref 26.0–34.0)
MCHC: 32.5 g/dL (ref 30.0–36.0)
MCV: 83.7 fL (ref 80.0–100.0)
Monocytes Absolute: 0.5 10*3/uL (ref 0.1–1.0)
Monocytes Relative: 5 %
Neutro Abs: 7.7 10*3/uL (ref 1.7–7.7)
Neutrophils Relative %: 76 %
Platelet Count: 87 10*3/uL — ABNORMAL LOW (ref 150–400)
RBC: 4.41 MIL/uL (ref 3.87–5.11)
RDW: 12.6 % (ref 11.5–15.5)
WBC Count: 10.1 10*3/uL (ref 4.0–10.5)
nRBC: 0 % (ref 0.0–0.2)

## 2019-04-24 LAB — FOLATE: Folate: 21.3 ng/mL (ref >5.9)

## 2019-04-24 LAB — CMP (CANCER CENTER ONLY)
ALT: 7 U/L (ref 0–44)
AST: 14 U/L — ABNORMAL LOW (ref 15–41)
Albumin: 2.9 g/dL — ABNORMAL LOW (ref 3.5–5.0)
Alkaline Phosphatase: 128 U/L — ABNORMAL HIGH (ref 38–126)
Anion gap: 8 (ref 5–15)
BUN: 6 mg/dL (ref 6–20)
CO2: 25 mmol/L (ref 22–32)
Calcium: 8.9 mg/dL (ref 8.9–10.3)
Chloride: 106 mmol/L (ref 98–111)
Creatinine: 0.64 mg/dL (ref 0.44–1.00)
GFR, Est AFR Am: >60 mL/min (ref >60)
GFR, Estimated: >60 mL/min (ref >60)
Glucose, Bld: 71 mg/dL (ref 70–99)
Potassium: 4.2 mmol/L (ref 3.5–5.1)
Sodium: 139 mmol/L (ref 135–145)
Total Bilirubin: 0.2 mg/dL — ABNORMAL LOW (ref 0.3–1.2)
Total Protein: 6.4 g/dL — ABNORMAL LOW (ref 6.5–8.1)

## 2019-04-24 LAB — PROTIME-INR
INR: 1 (ref 0.8–1.2)
Prothrombin Time: 12.7 seconds (ref 11.4–15.2)

## 2019-04-24 LAB — APTT: aPTT: 30 seconds (ref 24–36)

## 2019-04-24 LAB — VITAMIN B12: Vitamin B-12: 248 pg/mL (ref 180–914)

## 2019-04-24 LAB — SAVE SMEAR(SSMR), FOR PROVIDER SLIDE REVIEW

## 2019-04-24 LAB — IMMATURE PLATELET FRACTION: Immature Platelet Fraction: 15.9 % — ABNORMAL HIGH (ref 1.2–8.6)

## 2019-04-24 LAB — HEPATITIS C ANTIBODY: HCV Ab: NONREACTIVE

## 2019-04-24 LAB — PLATELET BY CITRATE

## 2019-04-25 ENCOUNTER — Telehealth: Payer: Self-pay | Admitting: Hematology and Oncology

## 2019-04-25 ENCOUNTER — Other Ambulatory Visit: Payer: Self-pay | Admitting: *Deleted

## 2019-04-25 LAB — HOMOCYSTEINE: Homocysteine: 5.8 umol/L (ref 0.0–14.5)

## 2019-04-25 NOTE — Telephone Encounter (Signed)
Scheduled 5/26 appt per 2/24 los. Confirmed appt date and time with patient.

## 2019-04-26 ENCOUNTER — Ambulatory Visit (HOSPITAL_COMMUNITY): Payer: Medicaid Other | Admitting: *Deleted

## 2019-04-26 ENCOUNTER — Other Ambulatory Visit: Payer: Self-pay

## 2019-04-26 ENCOUNTER — Encounter (HOSPITAL_COMMUNITY): Payer: Self-pay

## 2019-04-26 ENCOUNTER — Ambulatory Visit (HOSPITAL_COMMUNITY)
Admission: RE | Admit: 2019-04-26 | Discharge: 2019-04-26 | Disposition: A | Discharge status: Home / Self Care | Payer: Medicaid Other | Source: Ambulatory Visit | Attending: Obstetrics and Gynecology | Admitting: Obstetrics and Gynecology

## 2019-04-26 DIAGNOSIS — O210 Mild hyperemesis gravidarum: Secondary | ICD-10-CM | POA: Insufficient documentation

## 2019-04-26 DIAGNOSIS — O0993 Supervision of high risk pregnancy, unspecified, third trimester: Secondary | ICD-10-CM | POA: Diagnosis present

## 2019-04-26 DIAGNOSIS — O99113 Other diseases of the blood and blood-forming organs and certain disorders involving the immune mechanism complicating pregnancy, third trimester: Secondary | ICD-10-CM | POA: Diagnosis present

## 2019-04-26 DIAGNOSIS — Z3A33 33 weeks gestation of pregnancy: Secondary | ICD-10-CM

## 2019-04-26 DIAGNOSIS — D696 Thrombocytopenia, unspecified: Secondary | ICD-10-CM | POA: Diagnosis present

## 2019-04-28 LAB — METHYLMALONIC ACID, SERUM: Methylmalonic Acid, Quantitative: 159 nmol/L (ref 0–378)

## 2019-04-29 ENCOUNTER — Other Ambulatory Visit (HOSPITAL_COMMUNITY): Payer: Self-pay | Admitting: *Deleted

## 2019-04-29 ENCOUNTER — Encounter: Payer: Self-pay | Admitting: Obstetrics and Gynecology

## 2019-04-29 DIAGNOSIS — O365990 Maternal care for other known or suspected poor fetal growth, unspecified trimester, not applicable or unspecified: Secondary | ICD-10-CM

## 2019-04-29 HISTORY — DX: Maternal care for other known or suspected poor fetal growth, unspecified trimester, not applicable or unspecified: O36.5990

## 2019-05-06 ENCOUNTER — Ambulatory Visit (INDEPENDENT_AMBULATORY_CARE_PROVIDER_SITE_OTHER): Payer: Medicaid Other | Admitting: Obstetrics & Gynecology

## 2019-05-06 ENCOUNTER — Other Ambulatory Visit: Payer: Self-pay

## 2019-05-06 ENCOUNTER — Encounter: Payer: Self-pay | Admitting: Family Medicine

## 2019-05-06 VITALS — BP 136/68 | HR 100 | Wt 156.0 lb

## 2019-05-06 DIAGNOSIS — O99113 Other diseases of the blood and blood-forming organs and certain disorders involving the immune mechanism complicating pregnancy, third trimester: Secondary | ICD-10-CM

## 2019-05-06 DIAGNOSIS — O365930 Maternal care for other known or suspected poor fetal growth, third trimester, not applicable or unspecified: Secondary | ICD-10-CM

## 2019-05-06 DIAGNOSIS — D696 Thrombocytopenia, unspecified: Secondary | ICD-10-CM

## 2019-05-06 DIAGNOSIS — Z3A34 34 weeks gestation of pregnancy: Secondary | ICD-10-CM

## 2019-05-06 DIAGNOSIS — O0993 Supervision of high risk pregnancy, unspecified, third trimester: Secondary | ICD-10-CM

## 2019-05-06 MED ORDER — CETIRIZINE HCL 5 MG/5ML PO SOLN: 5.0000 mg | Freq: Every day | ORAL | 1 refills | Status: DC

## 2019-05-06 NOTE — Patient Instructions (Signed)

## 2019-05-06 NOTE — Progress Notes (Signed)
   PRENATAL VISIT NOTE  Subjective:  Ruth Solomon is a 19 y.o. G1P0 at [redacted]w[redacted]d being seen today for ongoing prenatal care.  She is currently monitored for the following issues for this high-risk pregnancy and has Morning sickness; Benign gestational thrombocytopenia in third trimester (HCC); Supervision of high risk pregnancy in third trimester; and Borderline FGR on their problem list.  Patient reports occasional contractions.  Contractions: Not present. Vag. Bleeding: None.  Movement: Present. Denies leaking of fluid.   The following portions of the patient's history were reviewed and updated as appropriate: allergies, current medications, past family history, past medical history, past social history, past surgical history and problem list.   Objective:   Vitals:   05/06/19 1450  BP: 136/68  Pulse: 100  Weight: 156 lb (70.8 kg)    Fetal Status: Fetal Heart Rate (bpm): 148   Movement: Present     General:  Alert, oriented and cooperative. Patient is in no acute distress.  Skin: Skin is warm and dry. No rash noted.   Cardiovascular: Normal heart rate noted  Respiratory: Normal respiratory effort, no problems with respiration noted  Abdomen: Soft, gravid, appropriate for gestational age.  Pain/Pressure: Present     Pelvic: Cervical exam deferred        Extremities: Normal range of motion.  Edema: None  Mental Status: Normal mood and affect. Normal behavior. Normal judgment and thought content.   Assessment and Plan:  Pregnancy: G1P0 at [redacted]w[redacted]d 1. Poor fetal growth affecting management of mother in third trimester, single or unspecified fetus [place] f/u 3/19  2. Benign gestational thrombocytopenia in third trimester [facility]) Results for Ruth Solomon (MRN [id number]) as of 05/06/2019 15:13  Ref. Range 04/24/2019 14:15  Platelets Latest Ref Range: 150 - 400 K/uL 87 (L)   F/U GROWTH [place] 3/19 3. Supervision of high risk pregnancy in third trimester   Preterm labor symptoms and general  obstetric precautions including but not limited to vaginal bleeding, contractions, leaking of fluid and fetal movement were reviewed in detail with the patient. Please refer to After Visit Summary for other counseling recommendations.   Return in about 2 weeks (around 05/20/2019).  Future Appointments  Date Time Provider Department Center  05/17/2019 11:30 AM [facility] NURSE [facility]FC-US  05/17/2019 11:30 AM [facility] [place] 5 [facility]-US  07/24/2019  2:30 PM [facility] LAB 2 [facility] None  07/24/2019  3:00 PM Jaci Standard, MD [facility] None    Scheryl Darter, MD

## 2019-05-13 ENCOUNTER — Ambulatory Visit (INDEPENDENT_AMBULATORY_CARE_PROVIDER_SITE_OTHER): Payer: Medicaid Other | Admitting: Family Medicine

## 2019-05-13 ENCOUNTER — Other Ambulatory Visit: Payer: Self-pay

## 2019-05-13 VITALS — BP 115/65 | HR 105

## 2019-05-13 DIAGNOSIS — O365930 Maternal care for other known or suspected poor fetal growth, third trimester, not applicable or unspecified: Secondary | ICD-10-CM

## 2019-05-13 DIAGNOSIS — O0993 Supervision of high risk pregnancy, unspecified, third trimester: Secondary | ICD-10-CM

## 2019-05-13 DIAGNOSIS — O99113 Other diseases of the blood and blood-forming organs and certain disorders involving the immune mechanism complicating pregnancy, third trimester: Secondary | ICD-10-CM

## 2019-05-13 DIAGNOSIS — Z3A35 35 weeks gestation of pregnancy: Secondary | ICD-10-CM

## 2019-05-13 DIAGNOSIS — D696 Thrombocytopenia, unspecified: Secondary | ICD-10-CM

## 2019-05-13 NOTE — Progress Notes (Signed)
   PRENATAL VISIT NOTE  Subjective:  Ruth Solomon is a 18 y.o. G1P0 at [redacted]w[redacted]d being seen today for ongoing prenatal care.  She is currently monitored for the following issues for this high-risk pregnancy and has Morning sickness; Benign gestational thrombocytopenia in third trimester (HCC); Supervision of high risk pregnancy in third trimester; and Borderline FGR on their problem list.  Patient reports no complaints.  Contractions: Irritability. Vag. Bleeding: None.  Movement: Present. Denies leaking of fluid.   The following portions of the patient's history were reviewed and updated as appropriate: allergies, current medications, past family history, past medical history, past social history, past surgical history and problem list.   Objective:   Vitals:   05/13/19 1458  BP: 115/65  Pulse: (!) 105    Fetal Status: Fetal Heart Rate (bpm): 146   Movement: Present     General:  Alert, oriented and cooperative. Patient is in no acute distress.  Skin: Skin is warm and dry. No rash noted.   Cardiovascular: Normal heart rate noted  Respiratory: Normal respiratory effort, no problems with respiration noted  Abdomen: Soft, gravid, appropriate for gestational age.  Pain/Pressure: Present     Pelvic: Cervical exam deferred        Extremities: Normal range of motion.  Edema: None  Mental Status: Normal mood and affect. Normal behavior. Normal judgment and thought content.   Assessment and Plan:  Pregnancy: G1P0 at [redacted]w[redacted]d 1. Supervision of high risk pregnancy in third trimester FT and FH normal.   2. Benign gestational thrombocytopenia in third trimester (HCC) Platelets 87 on last check. Will get CBC next appt.  3. Poor fetal growth affecting management of mother in third trimester, single or unspecified fetus Has [place] next week  Preterm labor symptoms and general obstetric precautions including but not limited to vaginal bleeding, contractions, leaking of fluid and fetal movement were  reviewed in detail with the patient. Please refer to After Visit Summary for other counseling recommendations.   Return in about 1 week (around 05/20/2019) for HR OB f/u.  Future Appointments  Date Time Provider Department Center  05/17/2019 11:30 AM [facility] NURSE [facility]FC-US  05/17/2019 11:30 AM [facility] [place] 5 [facility]-US  05/22/2019 10:35 AM Hermina Staggers, MD [facility]  07/24/2019  2:30 PM [facility] LAB 2 [facility] None  07/24/2019  3:00 PM Jaci Standard, MD [facility] None    Levie Heritage, DO

## 2019-05-17 ENCOUNTER — Ambulatory Visit (HOSPITAL_COMMUNITY): Payer: Medicaid Other | Admitting: *Deleted

## 2019-05-17 ENCOUNTER — Other Ambulatory Visit: Payer: Self-pay

## 2019-05-17 ENCOUNTER — Ambulatory Visit (HOSPITAL_COMMUNITY)
Admission: RE | Admit: 2019-05-17 | Discharge: 2019-05-17 | Disposition: A | Discharge status: Home / Self Care | Payer: Medicaid Other | Source: Ambulatory Visit | Attending: Maternal & Fetal Medicine | Admitting: Maternal & Fetal Medicine

## 2019-05-17 ENCOUNTER — Encounter (HOSPITAL_COMMUNITY): Payer: Self-pay

## 2019-05-17 DIAGNOSIS — Z3A36 36 weeks gestation of pregnancy: Secondary | ICD-10-CM

## 2019-05-17 DIAGNOSIS — Z362 Encounter for other antenatal screening follow-up: Secondary | ICD-10-CM | POA: Diagnosis not present

## 2019-05-17 DIAGNOSIS — O210 Mild hyperemesis gravidarum: Secondary | ICD-10-CM

## 2019-05-17 DIAGNOSIS — D696 Thrombocytopenia, unspecified: Secondary | ICD-10-CM

## 2019-05-17 DIAGNOSIS — O99113 Other diseases of the blood and blood-forming organs and certain disorders involving the immune mechanism complicating pregnancy, third trimester: Secondary | ICD-10-CM | POA: Diagnosis not present

## 2019-05-17 DIAGNOSIS — O365930 Maternal care for other known or suspected poor fetal growth, third trimester, not applicable or unspecified: Secondary | ICD-10-CM | POA: Diagnosis not present

## 2019-05-22 ENCOUNTER — Encounter: Payer: Self-pay | Admitting: Obstetrics and Gynecology

## 2019-05-22 ENCOUNTER — Other Ambulatory Visit: Payer: Self-pay

## 2019-05-22 ENCOUNTER — Ambulatory Visit (INDEPENDENT_AMBULATORY_CARE_PROVIDER_SITE_OTHER): Payer: Medicaid Other | Admitting: Obstetrics and Gynecology

## 2019-05-22 ENCOUNTER — Other Ambulatory Visit (HOSPITAL_COMMUNITY)
Admission: RE | Admit: 2019-05-22 | Discharge: 2019-05-22 | Disposition: A | Discharge status: Home / Self Care | Payer: Medicaid Other | Source: Ambulatory Visit | Attending: Obstetrics and Gynecology | Admitting: Obstetrics and Gynecology

## 2019-05-22 VITALS — BP 127/81 | HR 86 | Wt 171.9 lb

## 2019-05-22 DIAGNOSIS — O365930 Maternal care for other known or suspected poor fetal growth, third trimester, not applicable or unspecified: Secondary | ICD-10-CM

## 2019-05-22 DIAGNOSIS — O0993 Supervision of high risk pregnancy, unspecified, third trimester: Secondary | ICD-10-CM | POA: Insufficient documentation

## 2019-05-22 DIAGNOSIS — D696 Thrombocytopenia, unspecified: Secondary | ICD-10-CM

## 2019-05-22 DIAGNOSIS — O99113 Other diseases of the blood and blood-forming organs and certain disorders involving the immune mechanism complicating pregnancy, third trimester: Secondary | ICD-10-CM

## 2019-05-22 NOTE — Progress Notes (Signed)
Subjective:  Ruth Solomon is a 19 y.o. G1P0 at [redacted]w[redacted]d being seen today for ongoing prenatal care.  She is currently monitored for the following issues for this high-risk pregnancy and has Benign gestational thrombocytopenia in third trimester (HCC); Supervision of high risk pregnancy in third trimester; and Borderline FGR on their problem list.  Patient reports general discomforts of pregnancy.  Contractions: Irritability. Vag. Bleeding: None.  Movement: Present. Denies leaking of fluid.   The following portions of the patient's history were reviewed and updated as appropriate: allergies, current medications, past family history, past medical history, past social history, past surgical history and problem list. Problem list updated.  Objective:   Vitals:   05/22/19 1053  BP: 127/81  Pulse: 86  Weight: 171 lb 14.4 oz (78 kg)    Fetal Status: Fetal Heart Rate (bpm): 143   Movement: Present     General:  Alert, oriented and cooperative. Patient is in no acute distress.  Skin: Skin is warm and dry. No rash noted.   Cardiovascular: Normal heart rate noted  Respiratory: Normal respiratory effort, no problems with respiration noted  Abdomen: Soft, gravid, appropriate for gestational age. Pain/Pressure: Present     Pelvic:  Cervical exam performed        Extremities: Normal range of motion.  Edema: Trace  Mental Status: Normal mood and affect. Normal behavior. Normal judgment and thought content.   Urinalysis:      Assessment and Plan:  Pregnancy: G1P0 at [redacted]w[redacted]d  1. Supervision of high risk pregnancy in third trimester Labor precautions - GC/Chlamydia probe amp ([place])not at [facility] - Culture, beta strep (group b only)  2. Benign gestational thrombocytopenia in third trimester (HCC)  - CBC  3. Poor fetal growth affecting management of mother in third trimester, single or unspecified fetus U/S 05/17/19, 11% growth, f/u U/S as clinically indicated  Preterm labor symptoms and  general obstetric precautions including but not limited to vaginal bleeding, contractions, leaking of fluid and fetal movement were reviewed in detail with the patient. Please refer to After Visit Summary for other counseling recommendations.  Return in about 1 week (around 05/29/2019) for OB visit, virtual, MD provider.   Ruth Staggers, MD

## 2019-05-22 NOTE — Patient Instructions (Signed)

## 2019-05-23 LAB — CBC
Hematocrit: 33.1 % — ABNORMAL LOW (ref 34.0–46.6)
Hemoglobin: 10.8 g/dL — ABNORMAL LOW (ref 11.1–15.9)
MCH: 26.7 pg (ref 26.6–33.0)
MCHC: 32.6 g/dL (ref 31.5–35.7)
MCV: 82 fL (ref 79–97)
Platelets: 79 10*3/uL — CL (ref 150–450)
RBC: 4.04 x10E6/uL (ref 3.77–5.28)
RDW: 13.2 % (ref 11.7–15.4)
WBC: 10.2 10*3/uL (ref 3.4–10.8)

## 2019-05-23 LAB — GC/CHLAMYDIA PROBE AMP (~~LOC~~) NOT AT ARMC
Chlamydia: NEGATIVE
Comment: NEGATIVE
Comment: NORMAL
Neisseria Gonorrhea: NEGATIVE

## 2019-05-26 ENCOUNTER — Encounter (HOSPITAL_COMMUNITY): Payer: Self-pay | Admitting: Obstetrics & Gynecology

## 2019-05-26 ENCOUNTER — Inpatient Hospital Stay (HOSPITAL_COMMUNITY)
Admission: AD | Admit: 2019-05-26 | Discharge: 2019-05-26 | Disposition: A | Discharge status: Home / Self Care | Payer: Medicaid Other | Attending: Obstetrics & Gynecology | Admitting: Obstetrics & Gynecology

## 2019-05-26 DIAGNOSIS — O471 False labor at or after 37 completed weeks of gestation: Secondary | ICD-10-CM | POA: Diagnosis present

## 2019-05-26 DIAGNOSIS — O26893 Other specified pregnancy related conditions, third trimester: Secondary | ICD-10-CM | POA: Diagnosis not present

## 2019-05-26 DIAGNOSIS — O212 Late vomiting of pregnancy: Secondary | ICD-10-CM | POA: Diagnosis not present

## 2019-05-26 DIAGNOSIS — Z3A37 37 weeks gestation of pregnancy: Secondary | ICD-10-CM

## 2019-05-26 DIAGNOSIS — O479 False labor, unspecified: Secondary | ICD-10-CM

## 2019-05-26 DIAGNOSIS — Z3689 Encounter for other specified antenatal screening: Secondary | ICD-10-CM

## 2019-05-26 DIAGNOSIS — Z79899 Other long term (current) drug therapy: Secondary | ICD-10-CM | POA: Diagnosis not present

## 2019-05-26 DIAGNOSIS — R030 Elevated blood-pressure reading, without diagnosis of hypertension: Secondary | ICD-10-CM

## 2019-05-26 DIAGNOSIS — M546 Pain in thoracic spine: Secondary | ICD-10-CM | POA: Insufficient documentation

## 2019-05-26 HISTORY — DX: Thrombocytopenia, unspecified: D69.6

## 2019-05-26 LAB — URINALYSIS, ROUTINE W REFLEX MICROSCOPIC
Bilirubin Urine: NEGATIVE
Glucose, UA: NEGATIVE mg/dL
Hgb urine dipstick: NEGATIVE
Ketones, ur: NEGATIVE mg/dL
Leukocytes,Ua: NEGATIVE
Nitrite: NEGATIVE
Protein, ur: NEGATIVE mg/dL
Specific Gravity, Urine: 1.019 (ref 1.005–1.030)
pH: 7 (ref 5.0–8.0)

## 2019-05-26 LAB — CULTURE, BETA STREP (GROUP B ONLY): Strep Gp B Culture: NEGATIVE

## 2019-05-26 NOTE — MAU Provider Note (Signed)
History     CSN: [id number]  Arrival date and time: 05/26/19 0006   First Provider Initiated Contact with Patient 05/26/19 0133      Chief Complaint  Patient presents with  . Contractions   Ruth Solomon is a 19 y.o. G1P0 at [redacted]w[redacted]d who receives care at [facility].  She presents today for Contractions.  She states she woke up around 10pm with nausea and vomiting.  She states at that time she had "a cramp that wouldn't go away."  She reports the cramps came every 5-30 minutes. She endorses fetal movement and denies vaginal concerns including bleeding, discharge, and leaking. Patient states she has been having upper back pain for the past week.  She denies a history of back problems and reports the pain is intermittent in nature.  However, patient unable to describe the pain.      OB History    Gravida  1   Para      Term      Preterm      AB      Living  0     SAB      TAB      Ectopic      Multiple      Live Births              Past Medical History:  Diagnosis Date  . Medical history non-contributory   . Thrombocytopenia (HCC)     Past Surgical History:  Procedure Laterality Date  . FOREIGN BODY REMOVAL Left    sewing needle removed from big toe    Family History  Problem Relation Age of Onset  . Healthy Mother   . Healthy Father     Social History   Tobacco Use  . Smoking status: Never Smoker  . Smokeless tobacco: Never Used  Substance Use Topics  . Alcohol use: Never  . Drug use: Not Currently    Types: Marijuana    Comment: last use a few months ago    Allergies: No Known Allergies  Medications Prior to Admission  Medication Sig Dispense Refill Last Dose  . Prenatal Vit-Fe Fumarate-FA (MULTIVITAMIN-PRENATAL) 27-0.8 MG TABS tablet Take 1 tablet by mouth daily at 12 noon.   05/25/2019 at Unknown time  . cetirizine HCl (ZYRTEC) 5 MG/5ML SOLN Take 5 mLs (5 mg total) by mouth daily. (Patient not taking: Reported on 05/17/2019) 300 mL 1    . docusate sodium (COLACE) 100 MG capsule Take 1 capsule (100 mg total) by mouth 2 (two) times daily as needed. (Patient not taking: Reported on 05/06/2019) 30 capsule 2   . famotidine (PEPCID) 20 MG tablet Take 1 tablet (20 mg total) by mouth 2 (two) times daily. (Patient not taking: Reported on 04/10/2019) 60 tablet 0   . polyethylene glycol (MIRALAX) 17 g packet Take 17 g by mouth daily. (Patient not taking: Reported on 05/06/2019) 14 each 0   . promethazine (PHENERGAN) 25 MG tablet Take 1 tablet (25 mg total) by mouth every 6 (six) hours as needed for nausea or vomiting. If unable to keep anything down, insert vaginally (Patient not taking: Reported on 04/10/2019) 30 tablet 3     Review of Systems  Constitutional: Negative for chills and fever.  Eyes: Negative for visual disturbance.  Respiratory: Negative for cough and shortness of breath.   Gastrointestinal: Positive for nausea (None currently) and vomiting (None currently). Negative for abdominal pain.  Genitourinary: Negative for difficulty urinating, dyspareunia, dysuria, vaginal bleeding and vaginal  discharge.  Musculoskeletal: Positive for back pain (Upper ).  Neurological: Positive for headaches ("had one 5 minutes ago, but it's gone." ). Negative for dizziness and light-headedness.   Physical Exam   Blood pressure 125/74, pulse 65, temperature 98.5 F (36.9 C), temperature source Oral, resp. rate 16, height 5\' 2"  (1.575 m), weight 78 kg, last menstrual period 09/13/2018. Vitals:   05/26/19 0046 05/26/19 0101 05/26/19 0116 05/26/19 0131  BP: 130/75 133/79 123/81 125/74  Pulse: 66 66 67 65  Resp: 16     Temp:      TempSrc:      Weight:      Height:        Physical Exam  Constitutional: She is oriented to person, place, and time. She appears well-developed and well-nourished. No distress.  HENT:  Head: Normocephalic and atraumatic.  Eyes: Conjunctivae are normal.  Cardiovascular: Normal rate, regular rhythm and normal heart  sounds.  Respiratory: Effort normal and breath sounds normal. No respiratory distress.  GI: Soft. There is no abdominal tenderness.  Gravid--fundal height appears AGA, Soft, NT   Musculoskeletal:        General: Normal range of motion.     Cervical back: Normal range of motion.  Neurological: She is alert and oriented to person, place, and time.  Psychiatric: She has a normal mood and affect. Her behavior is normal.    Fetal Assessment 135 bpm, Mod Var, -Decels, +Accels Toco: Occ Ctx  MAU Course  No results found for this or any previous visit (from the past 24 hour(s)). No results found.  MDM PE Labs: None EFM  Assessment and Plan  19 year old G1P0  SIUP at 56.4weeks Cat I FT Contractions Elevated BP x 1  -BP stable and normotensive with cycling. -Patient denies current pain or concerns. -Patient reports next appt in office on Wednesday March 31st. -Discussed 5/1/1 Rule and Labor Precautions Given. -Discussed one slightly elevated blood pressure, but since now normotensive will monitor and run labs in future if necessary.  -Encouraged to call or return to MAU if symptoms worsen or with the onset of new symptoms. -Discharged to home in stable condition.    Maryann Conners MSN, CNM 05/26/2019, 1:33 AM

## 2019-05-26 NOTE — MAU Note (Signed)
Pt reports she woke up around 10"30 tonight with some n/v. Vomited times one with abd and cramping that followed. Nausea is gone but stil c/o cramping off and on. Also c/o upper back pain and swelling in her feet.

## 2019-05-26 NOTE — Discharge Instructions (Signed)

## 2019-05-28 ENCOUNTER — Encounter (HOSPITAL_COMMUNITY): Payer: Self-pay | Admitting: Obstetrics & Gynecology

## 2019-05-28 ENCOUNTER — Other Ambulatory Visit: Payer: Self-pay

## 2019-05-28 ENCOUNTER — Inpatient Hospital Stay (EMERGENCY_DEPARTMENT_HOSPITAL)
Admission: AD | Admit: 2019-05-28 | Discharge: 2019-05-28 | Disposition: A | Discharge status: Home / Self Care | Payer: Medicaid Other | Source: Home / Self Care | Attending: Obstetrics & Gynecology | Admitting: Obstetrics & Gynecology

## 2019-05-28 ENCOUNTER — Encounter: Payer: Self-pay | Admitting: *Deleted

## 2019-05-28 DIAGNOSIS — Z3A37 37 weeks gestation of pregnancy: Secondary | ICD-10-CM

## 2019-05-28 DIAGNOSIS — Z0371 Encounter for suspected problem with amniotic cavity and membrane ruled out: Secondary | ICD-10-CM | POA: Diagnosis present

## 2019-05-28 LAB — WET PREP, GENITAL
Clue Cells Wet Prep HPF POC: NONE SEEN
Sperm: NONE SEEN
Trich, Wet Prep: NONE SEEN
WBC, Wet Prep HPF POC: NONE SEEN
Yeast Wet Prep HPF POC: NONE SEEN

## 2019-05-28 LAB — POCT FERN TEST: POCT Fern Test: NEGATIVE

## 2019-05-28 LAB — AMNISURE RUPTURE OF MEMBRANE (ROM) NOT AT ARMC: Amnisure ROM: NEGATIVE

## 2019-05-28 NOTE — MAU Note (Signed)
Pt here with reports of leaking clear fluid around 11pm. Is not wearing a pad. Contractions about 6 minutes apart. Denies vaginal bleeding. Reports good fetal movement. Cervix was closed on last exam.

## 2019-05-28 NOTE — Discharge Instructions (Signed)
The tests preformed to see if your water is broken are all NEGATIVE. Your water is NOT broken and you do not have a yeast infection.

## 2019-05-28 NOTE — MAU Provider Note (Signed)
S: [name] is a 19 y.o. G1P0 at [redacted]w[redacted]d  who presents to MAU today complaining of leaking of fluid since 2300. She denies vaginal bleeding. She endorses contractions. She reports normal fetal movement.    O: BP 127/87   Pulse 73   Temp 98.5 F (36.9 C)   Resp 17   Ht 5\' 2"  (1.575 m)   Wt 78.1 kg   LMP 09/13/2018 (Exact Date)   SpO2 100%   BMI 31.50 kg/m  GENERAL: Well-developed, well-nourished female in no acute distress.  HEAD: Normocephalic, atraumatic.  CHEST: Normal effort of breathing, regular heart rate ABDOMEN: Soft, nontender, gravid PELVIC: Normal external female genitalia. Vagina is pink and rugated. Cervix with normal contour, no lesions. Thick, clumpy, adherent vaginal discharge on vaginal walls and cervix.  Negative pooling.   Cervical exam:  Dilation: Closed Effacement (%): Thick Cervical Position: Posterior Station: -3 Presentation: Vertex Exam by:: [email or web address], CNM   Fetal Monitoring: Baseline: 145 Variability: moderate Accelerations: present Decelerations: absent Contractions: regular every 4-6 minutes  MDM: Carloyn Jaeger Slide done by RN x 2  Amnisure Wet Prep Results for orders placed or performed during the hospital encounter of 05/28/19 (from the past 24 hour(s))  POCT fern test     Status: None   Collection Time: 05/28/19  2:25 AM  Result Value Ref Range   POCT Fern Test Negative = intact amniotic membranes   Amnisure rupture of membrane (rom)not at [facility]     Status: None   Collection Time: 05/28/19  2:52 AM  Result Value Ref Range   Amnisure ROM NEGATIVE   Wet prep, genital     Status: None   Collection Time: 05/28/19  2:52 AM  Result Value Ref Range   Yeast Wet Prep HPF POC NONE SEEN NONE SEEN   Trich, Wet Prep NONE SEEN NONE SEEN   Clue Cells Wet Prep HPF POC NONE SEEN NONE SEEN   WBC, Wet Prep HPF POC NONE SEEN NONE SEEN   Sperm NONE SEEN      A: SIUP at [redacted]w[redacted]d  Membranes intact No leakage of amniotic fluid into vagina    P: Discharge home Keep scheduled appt with [facility] on 05/29/2019 Patient verbalized an understanding of the plan of care and agrees.   05/31/2019, CNM 05/28/2019, 2:33 AM

## 2019-05-29 ENCOUNTER — Other Ambulatory Visit: Payer: Self-pay

## 2019-05-29 ENCOUNTER — Telehealth (INDEPENDENT_AMBULATORY_CARE_PROVIDER_SITE_OTHER): Payer: Medicaid Other | Admitting: Family Medicine

## 2019-05-29 ENCOUNTER — Encounter (HOSPITAL_COMMUNITY): Payer: Self-pay | Admitting: Obstetrics & Gynecology

## 2019-05-29 ENCOUNTER — Inpatient Hospital Stay (HOSPITAL_COMMUNITY)
Admission: AD | Admit: 2019-05-29 | Discharge: 2019-06-02 | DRG: 787 | Disposition: A | Discharge status: Home / Self Care | Payer: Medicaid Other | Attending: Obstetrics & Gynecology | Admitting: Obstetrics & Gynecology

## 2019-05-29 VITALS — BP 151/90 | HR 70

## 2019-05-29 DIAGNOSIS — Z20822 Contact with and (suspected) exposure to covid-19: Secondary | ICD-10-CM | POA: Diagnosis present

## 2019-05-29 DIAGNOSIS — O0993 Supervision of high risk pregnancy, unspecified, third trimester: Secondary | ICD-10-CM | POA: Diagnosis not present

## 2019-05-29 DIAGNOSIS — O9912 Other diseases of the blood and blood-forming organs and certain disorders involving the immune mechanism complicating childbirth: Secondary | ICD-10-CM | POA: Diagnosis present

## 2019-05-29 DIAGNOSIS — Z3490 Encounter for supervision of normal pregnancy, unspecified, unspecified trimester: Secondary | ICD-10-CM | POA: Diagnosis present

## 2019-05-29 DIAGNOSIS — O99824 Streptococcus B carrier state complicating childbirth: Secondary | ICD-10-CM | POA: Diagnosis present

## 2019-05-29 DIAGNOSIS — O99113 Other diseases of the blood and blood-forming organs and certain disorders involving the immune mechanism complicating pregnancy, third trimester: Secondary | ICD-10-CM

## 2019-05-29 DIAGNOSIS — Z3A38 38 weeks gestation of pregnancy: Secondary | ICD-10-CM

## 2019-05-29 DIAGNOSIS — D6959 Other secondary thrombocytopenia: Secondary | ICD-10-CM | POA: Diagnosis present

## 2019-05-29 DIAGNOSIS — Z30017 Encounter for initial prescription of implantable subdermal contraceptive: Secondary | ICD-10-CM

## 2019-05-29 DIAGNOSIS — O365930 Maternal care for other known or suspected poor fetal growth, third trimester, not applicable or unspecified: Secondary | ICD-10-CM

## 2019-05-29 DIAGNOSIS — O133 Gestational [pregnancy-induced] hypertension without significant proteinuria, third trimester: Secondary | ICD-10-CM | POA: Diagnosis not present

## 2019-05-29 DIAGNOSIS — O9081 Anemia of the puerperium: Secondary | ICD-10-CM | POA: Diagnosis not present

## 2019-05-29 DIAGNOSIS — Z0371 Encounter for suspected problem with amniotic cavity and membrane ruled out: Secondary | ICD-10-CM | POA: Diagnosis not present

## 2019-05-29 DIAGNOSIS — O99013 Anemia complicating pregnancy, third trimester: Secondary | ICD-10-CM | POA: Diagnosis not present

## 2019-05-29 DIAGNOSIS — O134 Gestational [pregnancy-induced] hypertension without significant proteinuria, complicating childbirth: Principal | ICD-10-CM | POA: Diagnosis present

## 2019-05-29 DIAGNOSIS — O365990 Maternal care for other known or suspected poor fetal growth, unspecified trimester, not applicable or unspecified: Secondary | ICD-10-CM | POA: Diagnosis present

## 2019-05-29 DIAGNOSIS — O163 Unspecified maternal hypertension, third trimester: Secondary | ICD-10-CM | POA: Diagnosis not present

## 2019-05-29 DIAGNOSIS — Z3689 Encounter for other specified antenatal screening: Secondary | ICD-10-CM

## 2019-05-29 DIAGNOSIS — Z3A37 37 weeks gestation of pregnancy: Secondary | ICD-10-CM | POA: Diagnosis not present

## 2019-05-29 DIAGNOSIS — D696 Thrombocytopenia, unspecified: Secondary | ICD-10-CM | POA: Diagnosis present

## 2019-05-29 HISTORY — DX: Encounter for supervision of normal pregnancy, unspecified, unspecified trimester: Z34.90

## 2019-05-29 LAB — CBC
HCT: 34.4 % — ABNORMAL LOW (ref 36.0–46.0)
HCT: 36.1 % (ref 36.0–46.0)
Hemoglobin: 10.8 g/dL — ABNORMAL LOW (ref 12.0–15.0)
Hemoglobin: 11.6 g/dL — ABNORMAL LOW (ref 12.0–15.0)
MCH: 26.3 pg (ref 26.0–34.0)
MCH: 26.4 pg (ref 26.0–34.0)
MCHC: 31.4 g/dL (ref 30.0–36.0)
MCHC: 32.1 g/dL (ref 30.0–36.0)
MCV: 83.9 fL (ref 80.0–100.0)
MCV: 82 fL (ref 80.0–100.0)
Platelets: 92 10*3/uL — ABNORMAL LOW (ref 150–400)
Platelets: 87 10*3/uL — ABNORMAL LOW (ref 150–400)
RBC: 4.1 MIL/uL (ref 3.87–5.11)
RBC: 4.4 MIL/uL (ref 3.87–5.11)
RDW: 13.8 % (ref 11.5–15.5)
RDW: 13.9 % (ref 11.5–15.5)
WBC: 10.5 10*3/uL (ref 4.0–10.5)
WBC: 10.5 10*3/uL (ref 4.0–10.5)
nRBC: 0 % (ref 0.0–0.2)
nRBC: 0 % (ref 0.0–0.2)

## 2019-05-29 LAB — URINALYSIS, ROUTINE W REFLEX MICROSCOPIC
Bilirubin Urine: NEGATIVE
Glucose, UA: NEGATIVE mg/dL
Hgb urine dipstick: NEGATIVE
Ketones, ur: NEGATIVE mg/dL
Leukocytes,Ua: NEGATIVE
Nitrite: NEGATIVE
Protein, ur: NEGATIVE mg/dL
Specific Gravity, Urine: 1.004 — ABNORMAL LOW (ref 1.005–1.030)
pH: 6 (ref 5.0–8.0)

## 2019-05-29 LAB — COMPREHENSIVE METABOLIC PANEL
ALT: 10 U/L (ref 0–44)
AST: 17 U/L (ref 15–41)
Albumin: 2.5 g/dL — ABNORMAL LOW (ref 3.5–5.0)
Alkaline Phosphatase: 131 U/L — ABNORMAL HIGH (ref 38–126)
Anion gap: 7 (ref 5–15)
BUN: 5 mg/dL — ABNORMAL LOW (ref 6–20)
CO2: 22 mmol/L (ref 22–32)
Calcium: 8.7 mg/dL — ABNORMAL LOW (ref 8.9–10.3)
Chloride: 108 mmol/L (ref 98–111)
Creatinine, Ser: 0.61 mg/dL (ref 0.44–1.00)
GFR calc Af Amer: >60 mL/min (ref >60)
GFR calc non Af Amer: >60 mL/min (ref >60)
Glucose, Bld: 84 mg/dL (ref 70–99)
Potassium: 3.8 mmol/L (ref 3.5–5.1)
Sodium: 137 mmol/L (ref 135–145)
Total Bilirubin: 0.4 mg/dL (ref 0.3–1.2)
Total Protein: 5.4 g/dL — ABNORMAL LOW (ref 6.5–8.1)

## 2019-05-29 LAB — SARS CORONAVIRUS 2 (TAT 6-24 HRS): SARS Coronavirus 2: NEGATIVE

## 2019-05-29 LAB — TYPE AND SCREEN
ABO/RH(D): O POS
Antibody Screen: NEGATIVE

## 2019-05-29 LAB — PROTEIN / CREATININE RATIO, URINE
Creatinine, Urine: 41.2 mg/dL
Total Protein, Urine: <6 mg/dL

## 2019-05-29 MED ORDER — LIDOCAINE HCL (PF) 1 % IJ SOLN: 30.0000 mL | INTRAMUSCULAR | Status: DC | PRN

## 2019-05-29 MED ORDER — SOD CITRATE-CITRIC ACID 500-334 MG/5ML PO SOLN
30.0000 mL | ORAL | Status: DC | PRN
  Administered 2019-05-30: 30 mL via ORAL
  Filled 2019-05-29: qty 30

## 2019-05-29 MED ORDER — OXYTOCIN 40 UNITS IN NORMAL SALINE INFUSION - SIMPLE MED
2.5000 [IU]/h | INTRAVENOUS | Status: DC
  Filled 2019-05-29: qty 1000

## 2019-05-29 MED ORDER — OXYCODONE-ACETAMINOPHEN 5-325 MG PO TABS: 1.0000 | ORAL_TABLET | ORAL | Status: DC | PRN

## 2019-05-29 MED ORDER — OXYCODONE-ACETAMINOPHEN 5-325 MG PO TABS: 2.0000 | ORAL_TABLET | ORAL | Status: DC | PRN

## 2019-05-29 MED ORDER — MISOPROSTOL 50MCG HALF TABLET
50.0000 ug | ORAL_TABLET | ORAL | Status: DC
  Administered 2019-05-29: 50 ug via BUCCAL

## 2019-05-29 MED ORDER — FENTANYL CITRATE (PF) 100 MCG/2ML IJ SOLN
100.0000 ug | INTRAMUSCULAR | Status: DC | PRN
  Administered 2019-05-29 – 2019-05-30 (×3): 100 ug via INTRAVENOUS
  Filled 2019-05-29 (×3): qty 2

## 2019-05-29 MED ORDER — LACTATED RINGERS IV SOLN: INTRAVENOUS | Status: DC

## 2019-05-29 MED ORDER — MISOPROSTOL 50MCG HALF TABLET
50.0000 ug | ORAL_TABLET | Freq: Once | ORAL | Status: AC
  Administered 2019-05-29: 50 ug via BUCCAL
  Filled 2019-05-29: qty 1

## 2019-05-29 MED ORDER — FLEET ENEMA 7-19 GM/118ML RE ENEM: 1.0000 | ENEMA | RECTAL | Status: DC | PRN

## 2019-05-29 MED ORDER — ACETAMINOPHEN 325 MG PO TABS: 650.0000 mg | ORAL_TABLET | ORAL | Status: DC | PRN

## 2019-05-29 MED ORDER — MISOPROSTOL 50MCG HALF TABLET
ORAL_TABLET | ORAL | Status: AC
  Filled 2019-05-29: qty 1

## 2019-05-29 MED ORDER — LACTATED RINGERS IV SOLN
500.0000 mL | INTRAVENOUS | Status: DC | PRN
  Administered 2019-05-30: 300 mL via INTRAVENOUS

## 2019-05-29 MED ORDER — OXYTOCIN BOLUS FROM INFUSION: 500.0000 mL | Freq: Once | INTRAVENOUS | Status: DC

## 2019-05-29 MED ORDER — ONDANSETRON HCL 4 MG/2ML IJ SOLN: 4.0000 mg | Freq: Four times a day (QID) | INTRAMUSCULAR | Status: DC | PRN

## 2019-05-29 NOTE — Progress Notes (Signed)
I connected with  Shirlean Schlein on 05/29/19 at 10:15 AM EDT by telephone and verified that I am speaking with the correct person using two identifiers.   I discussed the limitations, risks, security and privacy concerns of performing an evaluation and management service by telephone and the availability of in person appointments. I also discussed with the patient that there may be a patient responsible charge related to this service. The patient expressed understanding and agreed to proceed.  Janene Madeira Lailoni Baquera, CMA 05/29/2019  9:52 AM

## 2019-05-29 NOTE — Progress Notes (Signed)
Labor Progress Note Ruth Solomon is a 19 y.o. G1P0 at [redacted]w[redacted]d presented for IOL for gHTN. S:  Feeling some contractions, states pain is well controlled, a 3/10. O:  BP 118/76   Pulse 66   Temp 97.6 F (36.4 C) (Oral)   Resp 16   Ht 5\' 2"  (1.575 m)   Wt 79.8 kg   LMP 09/13/2018 (Exact Date)   SpO2 100%   BMI 32.19 kg/m  EFM: 140/moderate/acels  CVE: Dilation: 1 Effacement (%): 50 Cervical Position: Middle Station: -2 Presentation: Vertex Exam by:: [email or web address] RN   A&P: 19 y.o. G1P0 [redacted]w[redacted]d for IOL for gHTN. #Labor: S/p cytotec x 1, will give 2nd cytotec. Can consider FB at next check. #Pain: Analgesia PRN #FWB: Cat I #GBS positive #gThrombocyopenia: Will check cbc q12hr. Currently 92>87 #gHTN: No severe range pressures, no severe features.  [redacted]w[redacted]d, DO 7:15 PM

## 2019-05-29 NOTE — H&P (Addendum)
OBSTETRIC ADMISSION HISTORY AND PHYSICAL  Ruth Solomon is a 19 y.o. female G1P0 with IUP at [redacted]w[redacted]d by u/s presenting for IOL for gHTN. She reports +FMs, No LOF, no VB, no blurry vision, headaches or peripheral edema, and RUQ pain.  She plans on breast feeding. She request nexplanon for birth control. She received her prenatal care at [facility]: By u/s --->  Estimated Date of Delivery: 06/12/19  Sono:    @[redacted]w[redacted]d , CWD, normal anatomy though certain features not well visualized, cephalic presentation, 4696E, 11% EFW   Prenatal History/Complications: Thrombocytopenia Borderline FGR  Past Medical History: Past Medical History:  Diagnosis Date   Thrombocytopenia (HCC)    Thrombocytopenia ([place])     Past Surgical History: Past Surgical History:  Procedure Laterality Date   FOREIGN BODY REMOVAL Left    sewing needle removed from big toe    Obstetrical History: OB History     Gravida  1   Para      Term      Preterm      AB      Living  0      SAB      TAB      Ectopic      Multiple      Live Births              Social History Social History   Socioeconomic History   Marital status: Single    Spouse name: Not on file   Number of children: Not on file   Years of education: Not on file   Highest education level: Not on file  Occupational History   Not on file  Tobacco Use   Smoking status: Never Smoker   Smokeless tobacco: Never Used  Substance and Sexual Activity   Alcohol use: Never   Drug use: Not Currently    Types: Marijuana    Comment: last use a few months ago   Sexual activity: Not Currently    Birth control/protection: None  Other Topics Concern   Not on file  Social History Narrative   Not on file   Social Determinants of Health   Financial Resource Strain:    Difficulty of Paying Living Expenses:   Food Insecurity: No Food Insecurity   Worried About Running Out of Food in the Last Year: Never true   [place]  in the Last Year: Never true  Transportation Needs: No Transportation Needs   Lack of Transportation (Medical): No   Lack of Transportation (Non-Medical): No  Physical Activity:    Days of Exercise per Week:    Minutes of Exercise per Session:   Stress:    Feeling of Stress :   Social Connections:    Frequency of Communication with Friends and Family:    Frequency of Social Gatherings with Friends and Family:    Attends Religious Services:    Active Member of Clubs or Organizations:    Attends Music therapist:    Marital Status:     Family History: Family History  Problem Relation Age of Onset   Healthy Mother    Healthy Father     Allergies: No Known Allergies  Medications Prior to Admission  Medication Sig Dispense Refill Last Dose   Prenatal Vit-Fe Fumarate-FA (MULTIVITAMIN-PRENATAL) 27-0.8 MG TABS tablet Take 1 tablet by mouth daily at 12 noon.   05/29/2019 at Unknown time     Review of Systems   All systems reviewed and  negative except as stated in HPI  Blood pressure 120/76, pulse 71, temperature 98.5 F (36.9 C), temperature source Oral, resp. rate 18, height 5\' 2"  (1.575 m), weight 79.8 kg, last menstrual period 09/13/2018, SpO2 100 %. General appearance: alert, cooperative and no distress Lungs: clear to auscultation bilaterally Heart: regular rate and rhythm Abdomen: soft, non-tender; bowel sounds normal Pelvic: Deferred Extremities: Homans sign is negative, no sign of DVT Fetal monitoringBaseline: 150 bpm, Variability: Good {> 6 bpm) and Accelerations: Not present Uterine activity no contractions     Prenatal labs: ABO, Rh: O/Positive/-- (10/08 0000) Antibody: Negative (10/08 0000) Rubella: Immune (10/08 0000) RPR: Nonreactive (01/25 0000)  HBsAg: Negative (10/08 0000)  HIV: Non Reactive (02/10 1553)  GBS: Negative/-- (03/24 1155)  1 hr Glucola not seen in system Genetic screening  CF neg  Anatomy 08-09-1986 Normal  Prenatal Transfer Tool   Maternal Diabetes: No Genetic Screening: CF neg Maternal Ultrasounds/Referrals: Normal, though certain features not well visualized Fetal Ultrasounds or other Referrals:  None Maternal Substance Abuse:  No Significant Maternal Medications:  None Significant Maternal Lab Results: Group B Strep negative  Results for orders placed or performed during the hospital encounter of 05/29/19 (from the past 24 hour(s))  Urinalysis, Routine w reflex microscopic   Collection Time: 05/29/19 11:48 AM  Result Value Ref Range   Color, Urine YELLOW YELLOW   APPearance HAZY (A) CLEAR   Specific Gravity, Urine 1.004 (L) 1.005 - 1.030   pH 6.0 5.0 - 8.0   Glucose, UA NEGATIVE NEGATIVE mg/dL   Hgb urine dipstick NEGATIVE NEGATIVE   Bilirubin Urine NEGATIVE NEGATIVE   Ketones, ur NEGATIVE NEGATIVE mg/dL   Protein, ur NEGATIVE NEGATIVE mg/dL   Nitrite NEGATIVE NEGATIVE   Leukocytes,Ua NEGATIVE NEGATIVE    Patient Active Problem List   Diagnosis Date Noted   Borderline FGR 04/29/2019   Benign gestational thrombocytopenia in third trimester (HCC) 04/10/2019   Supervision of high risk pregnancy in third trimester 04/10/2019    Assessment/Plan:  Ruth Solomon is a 19 y.o. G1P0 at [redacted]w[redacted]d here for IOL for gHTN.  #Labor: Will start with buccal cytotec, recheck in 4 hours at which point can consider additional cytotec vs pitocin. #Pain: Analgesia PRN #FWB: Cat I #ID: GBS neg #MOF: Breast #MOC:Nexplanon #Circ: Yes #Thrombocytopenia: Followed by hematology. Platelets 79>92k. #gHTN: No severe range pressures, no severe features, labs pending.  [redacted]w[redacted]d, DO  05/29/2019, 12:32 PM   Attestation of Supervision of Student:  I confirm that I have verified the information documented in the  resident's  note and that I have also personally reperformed the history, physical exam and all medical decision making activities.  I have verified that all services and findings are accurately documented in this  student's note; and I agree with management and plan as outlined in the documentation. I have also made any necessary editorial changes.    05/31/2019 DNP, CNM  05/29/19  3:30 PM

## 2019-05-29 NOTE — MAU Note (Signed)
Ruth Solomon is a 19 y.o. at [redacted]w[redacted]d here in MAU reporting: had a virtual visit and her BP was elevated. Was sent over for evaluation and potential induction. Denies headache and blurry vision today. No RUQ pain. Having irregular contractions, no bleeding, no LOF.  Onset of complaint: today  Pain score: 0/10  Vitals:   05/29/19 1136  BP: (!) 147/95  Pulse: 67  Resp: 16  Temp: 98.5 F (36.9 C)  SpO2: 100%     FHT: +FM  Lab orders placed from triage: UA

## 2019-05-29 NOTE — Progress Notes (Signed)
Ruth Solomon is a 19 y.o. G1P0 at [redacted]w[redacted]d by ultrasound admitted for induction of labor due to Hypertension.  Subjective:   Objective: BP 119/84   Pulse 70   Temp 98 F (36.7 C) (Oral)   Resp 16   Ht 5\' 2"  (1.575 m)   Wt 79.8 kg   LMP 09/13/2018 (Exact Date)   SpO2 100%   BMI 32.19 kg/m  No intake/output data recorded. No intake/output data recorded. Vitals:   05/29/19 1755 05/29/19 1902 05/29/19 2000 05/29/19 2100  BP: 121/75 118/76 136/85 119/84  Pulse: 66 66 68 70  Resp:   16 16  Temp:  97.6 F (36.4 C) 98 F (36.7 C)   TempSrc:  Oral Oral   SpO2:      Weight:      Height:        FHT:  FHR: 140 bpm, variability: moderate,  accelerations:  Present,  decelerations:  Absent UC:   irregular SVE:   Dilation: 1 Effacement (%): 50 Station: -2 Exam by:: [email or web address], CNM  Labs: Lab Results  Component Value Date   WBC 10.5 05/29/2019   HGB 11.6 (L) 05/29/2019   HCT 36.1 05/29/2019   MCV 82.0 05/29/2019   PLT 87 (L) 05/29/2019    Assessment / Plan: Induction of labor due to gestational hypertension,  progressing well on pitocin  Labor: Progressing normally Preeclampsia:  no signs or symptoms of toxicity stable labs Fetal Wellbeing:  Category I Pain Control:  Labor support without medications I/D:  n/a Anticipated MOD:  NSVD  05/31/2019 05/29/2019, 10:03 PM

## 2019-05-29 NOTE — MAU Provider Note (Signed)
History     CSN: [id number]  Arrival date and time: 05/29/19 1120   First Provider Initiated Contact with Patient 05/29/19 1217      Chief Complaint  Patient presents with  . Hypertension   [name] is a 19 y.o. G1P0 at [redacted]w[redacted]d who presents to MAU for preeclampsia evaluation after she experienced elevated blood pressures at home. Pt was also seen in MAU last night with elevated pressures above 140/90. Pt reports her BP cuff was not checked for fit by the clinic.  Pt denies HA, blurry vision/seeing spots, N/V, epigastric pain, swelling in face and hands, sudden weight gain. Pt denies chest pain and SOB.  Pt denies constipation, diarrhea, or urinary problems. Pt denies fever, chills, fatigue, sweating or changes in appetite. Pt denies dizziness, light-headedness, weakness.  Pt denies VB, ctx, LOF and reports good FM.  Current pregnancy problems? Thrombocytopenia, borderline FGR Blood Type? O Positive Allergies? NKDA Current medications? PNVs Current PNC & next appt? ELAM, no next appt scheduled   OB History    Gravida  1   Para      Term      Preterm      AB      Living  0     SAB      TAB      Ectopic      Multiple      Live Births              Past Medical History:  Diagnosis Date  . Thrombocytopenia ([place])   . Thrombocytopenia ([place])     Past Surgical History:  Procedure Laterality Date  . FOREIGN BODY REMOVAL Left    sewing needle removed from big toe    Family History  Problem Relation Age of Onset  . Healthy Mother   . Healthy Father     Social History   Tobacco Use  . Smoking status: Never Smoker  . Smokeless tobacco: Never Used  Substance Use Topics  . Alcohol use: Never  . Drug use: Not Currently    Types: Marijuana    Comment: last use a few months ago    Allergies: No Known Allergies  Medications Prior to Admission  Medication Sig Dispense Refill Last Dose  . Prenatal Vit-Fe Fumarate-FA  (MULTIVITAMIN-PRENATAL) 27-0.8 MG TABS tablet Take 1 tablet by mouth daily at 12 noon.   05/29/2019 at Unknown time    Review of Systems  Constitutional: Negative for chills, diaphoresis, fatigue and fever.  Eyes: Negative for visual disturbance.  Respiratory: Negative for shortness of breath.   Cardiovascular: Negative for chest pain.  Gastrointestinal: Negative for abdominal pain, constipation, diarrhea, nausea and vomiting.  Genitourinary: Negative for dysuria, flank pain, frequency, pelvic pain, urgency, vaginal bleeding and vaginal discharge.  Neurological: Negative for dizziness, weakness, light-headedness and headaches.   Physical Exam   Blood pressure 135/88, pulse 80, temperature 98.5 F (36.9 C), temperature source Oral, resp. rate 18, height 5\' 2"  (1.575 m), weight 79.8 kg, last menstrual period 09/13/2018, SpO2 100 %.  Patient Vitals for the past 24 hrs:  BP Temp Temp src Pulse Resp SpO2 Height Weight  05/29/19 1230 135/88 -- -- 80 -- 100 % -- --  05/29/19 1216 120/76 -- -- 71 -- -- -- --  05/29/19 1215 -- -- -- -- -- 100 % -- --  05/29/19 1200 121/88 -- -- 81 18 99 % -- --  05/29/19 1150 -- -- -- -- -- 100 % -- --  05/29/19 1148 122/89 -- -- 72 18 -- -- --  05/29/19 1136 (!) 147/95 98.5 F (36.9 C) Oral 67 16 100 % -- --  05/29/19 1132 -- -- -- -- -- -- 5\' 2"  (1.575 m) 79.8 kg   Physical Exam  Constitutional: She is oriented to person, place, and time. She appears well-developed and well-nourished. No distress.  HENT:  Head: Normocephalic and atraumatic.  Respiratory: Effort normal.  Neurological: She is alert and oriented to person, place, and time.  Skin: She is not diaphoretic.  Psychiatric: She has a normal mood and affect. Her behavior is normal. Judgment and thought content normal.   Results for orders placed or performed during the hospital encounter of 05/29/19 (from the past 24 hour(s))  Urinalysis, Routine w reflex microscopic     Status: Abnormal    Collection Time: 05/29/19 11:48 AM  Result Value Ref Range   Color, Urine YELLOW YELLOW   APPearance HAZY (A) CLEAR   Specific Gravity, Urine 1.004 (L) 1.005 - 1.030   pH 6.0 5.0 - 8.0   Glucose, UA NEGATIVE NEGATIVE mg/dL   Hgb urine dipstick NEGATIVE NEGATIVE   Bilirubin Urine NEGATIVE NEGATIVE   Ketones, ur NEGATIVE NEGATIVE mg/dL   Protein, ur NEGATIVE NEGATIVE mg/dL   Nitrite NEGATIVE NEGATIVE   Leukocytes,Ua NEGATIVE NEGATIVE    MAU Course  Procedures  MDM -pt sent from clinic after virtual visit today with elevated pressures at home -asymptomatic -elevated pressures last night during BP check and today on admission to MAU -EFM: reactive       -baseline: 140       -variability: moderate       -accels: present, 15x15       -decels: absent       -TOCO: few ctx, pt not feeling, q7-6min -report called to L&D -admit to L&D for induction for gHTN  Orders Placed This Encounter  Procedures  . Urinalysis, Routine w reflex microscopic    Standing Status:   Standing    Number of Occurrences:   1  . CBC    Standing Status:   Standing    Number of Occurrences:   1  . Comprehensive metabolic panel    Standing Status:   Standing    Number of Occurrences:   1  . Protein / creatinine ratio, urine    Standing Status:   Standing    Number of Occurrences:   1   No orders of the defined types were placed in this encounter.   Assessment and Plan   1. Gestational hypertension, third trimester   2. [redacted] weeks gestation of pregnancy   3. NST (non-stress test) reactive    -admit to L&D for induction d/t gHTN  4m Ruth Solomon 05/29/2019, 12:38 PM

## 2019-05-29 NOTE — Progress Notes (Signed)
Ruth Solomon is a 19 y.o. G1P0 at [redacted]w[redacted]d   Subjective:  Patient has had 2 doses of Cytotec. No complaints at this time. Not feeling any contractions.   Objective: BP 118/76   Pulse 66   Temp 97.6 F (36.4 C) (Oral)   Resp 16   Ht 5\' 2"  (1.575 m)   Wt 79.8 kg   LMP 09/13/2018 (Exact Date)   SpO2 100%   BMI 32.19 kg/m  No intake/output data recorded. No intake/output data recorded.  FHT:  FHR: 130 bpm, variability: moderate,  accelerations:  Present,  decelerations:  Absent UC:   none SVE:   Dilation: 1 Effacement (%): 50 Station: -2 Exam by:: [email or web address] RN  Labs: Lab Results  Component Value Date   WBC 10.5 05/29/2019   HGB 11.6 (L) 05/29/2019   HCT 36.1 05/29/2019   MCV 82.0 05/29/2019   PLT 87 (L) 05/29/2019    Foley bulb placement:   Appropriate time out taken. The patient was placed in the lithotomy position and a cervical exam was performed and a finger was used to guide the 83F cookes balloon through the internal os of the cervix. cookes Balloon filled with 60cc of sterile water. Plug inserted into end of the foley. Foley placed on tension and taped to medial thigh.  NST:  EFM Baseline: 130 bpm, Variability: Good {> 6 bpm), Accelerations: Reactive and Decelerations: Absent  Toco: none There were no signs of tachysystole or hypertonus. All equipment was removed and accounted for. The patient tolerated the procedure well.      Assessment / Plan: IOL cervical ripening phase   Labor: Progressing normally Preeclampsia:  labs normal  Fetal Wellbeing:  Category I Pain Control:  Labor support without medications I/D:  n/a Anticipated MOD:  NSVD   05/31/2019 DNP, CNM  05/29/19  7:54 PM

## 2019-05-29 NOTE — Patient Instructions (Signed)

## 2019-05-29 NOTE — Progress Notes (Signed)
OBSTETRICS PRENATAL VIRTUAL VISIT ENCOUNTER NOTE  Provider location: [facility] [place]   I connected with Ruth Solomon on 05/29/19 at 10:15 AM EDT by MyChart Video Encounter at home and verified that I am speaking with the correct person using two identifiers.   I discussed the limitations, risks, security and privacy concerns of performing an evaluation and management service virtually and the availability of in person appointments. I also discussed with the patient that there may be a patient responsible charge related to this service. The patient expressed understanding and agreed to proceed. Subjective:  Ruth Solomon is a 19 y.o. G1P0 at [redacted]w[redacted]d being seen today for ongoing prenatal care.  She is currently monitored for the following issues for this low-risk pregnancy and has Benign gestational thrombocytopenia in third trimester (HCC); Supervision of high risk pregnancy in third trimester; and Borderline FGR on their problem list.  Patient reports no complaints.  Contractions: Irritability. Vag. Bleeding: None.  Movement: Present. Denies any leaking of fluid.   The following portions of the patient's history were reviewed and updated as appropriate: allergies, current medications, past family history, past medical history, past social history, past surgical history and problem list.   Objective:   Vitals:   05/29/19 0954 05/29/19 0955  BP: (!) 157/102 (!) 151/90  Pulse: 79 70    Fetal Status:     Movement: Present     General:  Alert, oriented and cooperative. Patient is in no acute distress.  Respiratory: Normal respiratory effort, no problems with respiration noted  Mental Status: Normal mood and affect. Normal behavior. Normal judgment and thought content.  Rest of physical exam deferred due to type of encounter  Imaging: [place] MFM OB FOLLOW UP  Result Date: 05/17/2019 ----------------------------------------------------------------------  OBSTETRICS  REPORT                       (Signed Final 05/17/2019 01:54 pm) ---------------------------------------------------------------------- Patient Info  ID #:       [id number]                          D.O.B.:  2001/02/03 (19 yrs)  Name:       Ruth Solomon                Visit Date: 05/17/2019 12:26 pm ---------------------------------------------------------------------- Performed By  Performed By:     Emeline Darling BS,      Referred By:      Leavy Cella                    RDMS                                     MCENTYRE  Attending:        Noralee Space MD        [facility] ---------------------------------------------------------------------- Orders   #  Description  Code         Ordered By   1  US MFM OB FOLLOW UP                  [id number]     Lin LandsmanORENTHIAN                                                        BOOKER  ----------------------------------------------------------------------   #  Order #                    Accession #                 Episode #   1  [id number]                  [id number]                  [id number]  ---------------------------------------------------------------------- Indications   Small for gestational age fetus affecting      O36.5990   management of mother   8436 weeks gestation of pregnancy                Z3A.36   Thrombocytopenia affecting pregnancy,          O99.119, D69.6   antepartum   Encounter for other antenatal screening        Z36.2   follow-up  ---------------------------------------------------------------------- Fetal Evaluation  Num Of Fetuses:         1  Fetal Heart Rate(bpm):  130  Cardiac Activity:       Observed  Presentation:           Cephalic  Placenta:               Posterior  P. Cord Insertion:      Previously Visualized  Amniotic Fluid  AFI FV:      Within normal limits  AFI Sum(cm)     %Tile       Largest Pocket(cm)  18.29           69          5.88   RUQ(cm)       RLQ(cm)       LUQ(cm)        LLQ(cm)  3.91          5.35          5.88           3.15 ---------------------------------------------------------------------- Biometry  BPD:      89.3  mm     G. Age:  36w 1d         56  %    CI:        81.09   %    70 - 86                                                          FL/HC:      20.7   %    20.1 - 22.1  HC:      313.1  mm     G. Age:  35w 1d  5  %    HC/AC:      1.02        0.93 - 1.11  AC:       306   mm     G. Age:  34w 4d         15  %    FL/BPD:     72.5   %    71 - 87  FL:       64.7  mm     G. Age:  33w 3d          2  %    FL/AC:      21.1   %    20 - 24  Est. FW:    2430  gm      5 lb 6 oz     11  % ---------------------------------------------------------------------- OB History  Gravidity:    1         Term:   0  Living:       0 ---------------------------------------------------------------------- Gestational Age  LMP:           35w 1d        Date:  09/13/18                 EDD:   06/20/19  U/S Today:     34w 6d                                        EDD:   06/22/19  Best:          36w 2d     Det. By:  U/S C R L  (12/13/18)    EDD:   06/12/19 ---------------------------------------------------------------------- Anatomy  Cranium:               Appears normal         LVOT:                   Not well visualized  Cavum:                 Appears normal         Aortic Arch:            Previously seen  Ventricles:            Appears normal         Ductal Arch:            Not well visualized  Choroid Plexus:        Appears normal         Diaphragm:              Appears normal  Cerebellum:            Visualized             Stomach:                Appears normal, left                                                                        sided  Posterior Fossa:  Not well visualized    Abdomen:                Appears normal  Nuchal Fold:           Not applicable (>20    Abdominal Wall:         Not well visualized                         wks GA)   Face:                  Orbits appear          Cord Vessels:           Previously seen                         normal  Lips:                  Appears normal         Kidneys:                Appear normal  Palate:                Not well visualized    Bladder:                Appears normal  Thoracic:              Appears normal         Spine:                  Limited views                                                                        appear normal prev  Heart:                 Not well visualized    Upper Extremities:      Not well visualized  RVOT:                  Not well visualized    Lower Extremities:      Previously seen  Other:  Technically difficult due to fetal position. Technically difficult due to          advanced gestational age. ---------------------------------------------------------------------- Doppler - Fetal Vessels  Umbilical Artery   S/D     %tile     RI              PI                     ADFV    RDFV  3.14       88   0.68             1.21                        No      No ---------------------------------------------------------------------- Cervix Uterus Adnexa  Cervix  Not visualized (advanced GA >24wks) ---------------------------------------------------------------------- Impression  Patient returned for fetal growth assessment. She has  gestational thrombocytopenia. On previous ultrasound, the  estimated fetal  weight was at the 12th percentile. Patient  does not have gestational diabetes.  Fetal growth is appropriate for gestational age. Estimated  fetal weight is at the 11th percentile. Amniotic fluid is normal  and good fetal activity is seen.  We reassured the patient of the findings. ---------------------------------------------------------------------- Recommendations  Follow-up scans as clinically indicated. ----------------------------------------------------------------------                  Noralee Space, MD Electronically Signed Final Report   05/17/2019 01:54 pm  ----------------------------------------------------------------------   Assessment and Plan:  Pregnancy: G1P0 at [redacted]w[redacted]d 1. Benign gestational thrombocytopenia in third trimester (HCC)   2. Supervision of high risk pregnancy in third trimester Elevated BP today--for labor check this week with BP in the 140/80 range--given FGR, low plts and elevated Bp x 2--will move for delivery. Advised to go to MAU for eval + labs.  3. Poor fetal growth affecting management of mother in third trimester, single or unspecified fetus 11% ile at last check  Preterm labor symptoms and general obstetric precautions including but not limited to vaginal bleeding, contractions, leaking of fluid and fetal movement were reviewed in detail with the patient. I discussed the assessment and treatment plan with the patient. The patient was provided an opportunity to ask questions and all were answered. The patient agreed with the plan and demonstrated an understanding of the instructions. The patient was advised to call back or seek an in-person office evaluation/go to MAU at [facility] for any urgent or concerning symptoms. Please refer to After Visit Summary for other counseling recommendations.   I provided 8 minutes of face-to-face time during this encounter.  Return in 1 week (on 06/05/2019).  Future Appointments  Date Time Provider Department Center  05/29/2019 10:15 AM Reva Bores, MD [facility]  07/24/2019  2:30 PM [facility] LAB 2 [facility] None  07/24/2019  3:00 PM Jaci Standard, MD [facility] None    Reva Bores, MD [facility] for [facility], [facility]

## 2019-05-29 NOTE — Progress Notes (Signed)
Patient ID: Ruth Solomon, female   DOB: [birth year], 19 y.o.   MRN: [id number] Contractions strong and painful Getting Fentanyl with moderate relief of pain  .Dilation: 2 Effacement (%): 70, 60 Cervical Position: Middle Station: Ballotable Presentation: Vertex Exam by:: Wynelle Bourgeois, CNM  Cervix thin with bloody show  FHR stable  UCs q 2-3 min  Will stop cytotec and wait for balloon

## 2019-05-30 ENCOUNTER — Encounter (HOSPITAL_COMMUNITY)
Admission: AD | Disposition: A | Discharge status: Home / Self Care | Payer: Self-pay | Source: Home / Self Care | Attending: Obstetrics & Gynecology

## 2019-05-30 ENCOUNTER — Encounter (HOSPITAL_COMMUNITY): Payer: Self-pay | Admitting: Family Medicine

## 2019-05-30 ENCOUNTER — Inpatient Hospital Stay (HOSPITAL_COMMUNITY): Payer: Medicaid Other | Admitting: Certified Registered Nurse Anesthetist

## 2019-05-30 DIAGNOSIS — Z3A38 38 weeks gestation of pregnancy: Secondary | ICD-10-CM

## 2019-05-30 DIAGNOSIS — O99013 Anemia complicating pregnancy, third trimester: Secondary | ICD-10-CM

## 2019-05-30 DIAGNOSIS — O163 Unspecified maternal hypertension, third trimester: Secondary | ICD-10-CM

## 2019-05-30 DIAGNOSIS — D696 Thrombocytopenia, unspecified: Secondary | ICD-10-CM

## 2019-05-30 LAB — CBC WITH DIFFERENTIAL/PLATELET
Abs Immature Granulocytes: 0.09 10*3/uL — ABNORMAL HIGH (ref 0.00–0.07)
Abs Immature Granulocytes: 0.1 10*3/uL — ABNORMAL HIGH (ref 0.00–0.07)
Abs Immature Granulocytes: 0.17 10*3/uL — ABNORMAL HIGH (ref 0.00–0.07)
Abs Immature Granulocytes: 0.23 10*3/uL — ABNORMAL HIGH (ref 0.00–0.07)
Basophils Absolute: 0 10*3/uL (ref 0.0–0.1)
Basophils Absolute: 0 10*3/uL (ref 0.0–0.1)
Basophils Absolute: 0 10*3/uL (ref 0.0–0.1)
Basophils Absolute: 0 10*3/uL (ref 0.0–0.1)
Basophils Relative: 0 %
Basophils Relative: 0 %
Basophils Relative: 0 %
Basophils Relative: 0 %
Eosinophils Absolute: 0 10*3/uL (ref 0.0–0.5)
Eosinophils Absolute: 0.1 10*3/uL (ref 0.0–0.5)
Eosinophils Absolute: 0.1 10*3/uL (ref 0.0–0.5)
Eosinophils Absolute: 0.1 10*3/uL (ref 0.0–0.5)
Eosinophils Relative: 0 %
Eosinophils Relative: 1 %
Eosinophils Relative: 1 %
Eosinophils Relative: 1 %
HCT: 34.4 % — ABNORMAL LOW (ref 36.0–46.0)
HCT: 35.7 % — ABNORMAL LOW (ref 36.0–46.0)
HCT: 36.3 % (ref 36.0–46.0)
HCT: 38.6 % (ref 36.0–46.0)
Hemoglobin: 10.8 g/dL — ABNORMAL LOW (ref 12.0–15.0)
Hemoglobin: 11.4 g/dL — ABNORMAL LOW (ref 12.0–15.0)
Hemoglobin: 11.5 g/dL — ABNORMAL LOW (ref 12.0–15.0)
Hemoglobin: 12.5 g/dL (ref 12.0–15.0)
Immature Granulocytes: 1 %
Immature Granulocytes: 1 %
Immature Granulocytes: 1 %
Immature Granulocytes: 1 %
Lymphocytes Relative: 13 %
Lymphocytes Relative: 15 %
Lymphocytes Relative: 16 %
Lymphocytes Relative: 4 %
Lymphs Abs: 0.8 10*3/uL (ref 0.7–4.0)
Lymphs Abs: 1.6 10*3/uL (ref 0.7–4.0)
Lymphs Abs: 1.7 10*3/uL (ref 0.7–4.0)
Lymphs Abs: 1.9 10*3/uL (ref 0.7–4.0)
MCH: 26.3 pg (ref 26.0–34.0)
MCH: 26.3 pg (ref 26.0–34.0)
MCH: 26.7 pg (ref 26.0–34.0)
MCH: 26.8 pg (ref 26.0–34.0)
MCHC: 31.4 g/dL (ref 30.0–36.0)
MCHC: 31.7 g/dL (ref 30.0–36.0)
MCHC: 31.9 g/dL (ref 30.0–36.0)
MCHC: 32.4 g/dL (ref 30.0–36.0)
MCV: 82.4 fL (ref 80.0–100.0)
MCV: 82.8 fL (ref 80.0–100.0)
MCV: 82.9 fL (ref 80.0–100.0)
MCV: 84.9 fL (ref 80.0–100.0)
Monocytes Absolute: 0.3 10*3/uL (ref 0.1–1.0)
Monocytes Absolute: 0.6 10*3/uL (ref 0.1–1.0)
Monocytes Absolute: 0.7 10*3/uL (ref 0.1–1.0)
Monocytes Absolute: 0.7 10*3/uL (ref 0.1–1.0)
Monocytes Relative: 2 %
Monocytes Relative: 5 %
Monocytes Relative: 6 %
Monocytes Relative: 6 %
Neutro Abs: 10.2 10*3/uL — ABNORMAL HIGH (ref 1.7–7.7)
Neutro Abs: 17.7 10*3/uL — ABNORMAL HIGH (ref 1.7–7.7)
Neutro Abs: 8.7 10*3/uL — ABNORMAL HIGH (ref 1.7–7.7)
Neutro Abs: 8.8 10*3/uL — ABNORMAL HIGH (ref 1.7–7.7)
Neutrophils Relative %: 76 %
Neutrophils Relative %: 77 %
Neutrophils Relative %: 80 %
Neutrophils Relative %: 93 %
Platelets: 67 10*3/uL — ABNORMAL LOW (ref 150–400)
Platelets: 82 10*3/uL — ABNORMAL LOW (ref 150–400)
Platelets: 83 10*3/uL — ABNORMAL LOW (ref 150–400)
Platelets: 90 10*3/uL — ABNORMAL LOW (ref 150–400)
RBC: 4.05 MIL/uL (ref 3.87–5.11)
RBC: 4.33 MIL/uL (ref 3.87–5.11)
RBC: 4.38 MIL/uL (ref 3.87–5.11)
RBC: 4.66 MIL/uL (ref 3.87–5.11)
RDW: 13.8 % (ref 11.5–15.5)
RDW: 13.9 % (ref 11.5–15.5)
RDW: 13.9 % (ref 11.5–15.5)
RDW: 14 % (ref 11.5–15.5)
WBC: 11.4 10*3/uL — ABNORMAL HIGH (ref 4.0–10.5)
WBC: 11.5 10*3/uL — ABNORMAL HIGH (ref 4.0–10.5)
WBC: 12.7 10*3/uL — ABNORMAL HIGH (ref 4.0–10.5)
WBC: 19.1 10*3/uL — ABNORMAL HIGH (ref 4.0–10.5)
nRBC: 0 % (ref 0.0–0.2)
nRBC: 0 % (ref 0.0–0.2)
nRBC: 0 % (ref 0.0–0.2)
nRBC: 0 % (ref 0.0–0.2)

## 2019-05-30 LAB — RPR: RPR Ser Ql: NONREACTIVE

## 2019-05-30 SURGERY — Surgical Case
Anesthesia: General | Site: Abdomen | Wound class: Clean Contaminated

## 2019-05-30 MED ORDER — OXYTOCIN 40 UNITS IN NORMAL SALINE INFUSION - SIMPLE MED
INTRAVENOUS | Status: AC
  Filled 2019-05-30: qty 2000

## 2019-05-30 MED ORDER — FENTANYL CITRATE (PF) 100 MCG/2ML IJ SOLN
INTRAMUSCULAR | Status: DC | PRN
  Administered 2019-05-30: 50 ug via INTRAVENOUS
  Administered 2019-05-30: 25 ug via INTRAVENOUS
  Administered 2019-05-30: 150 ug via INTRAVENOUS
  Administered 2019-05-30: 25 ug via INTRAVENOUS
  Administered 2019-05-30: 100 ug via INTRAVENOUS

## 2019-05-30 MED ORDER — PHENYLEPHRINE HCL (PRESSORS) 10 MG/ML IV SOLN
INTRAVENOUS | Status: DC | PRN
  Administered 2019-05-30: 120 ug via INTRAVENOUS
  Administered 2019-05-30: 80 ug via INTRAVENOUS
  Administered 2019-05-30: 120 ug via INTRAVENOUS

## 2019-05-30 MED ORDER — KETOROLAC TROMETHAMINE 30 MG/ML IJ SOLN: 30.0000 mg | Freq: Four times a day (QID) | INTRAMUSCULAR | Status: AC

## 2019-05-30 MED ORDER — SODIUM CHLORIDE 0.9 % IV SOLN
INTRAVENOUS | Status: DC | PRN
  Administered 2019-05-30: 16:00:00 500 mg via INTRAVENOUS

## 2019-05-30 MED ORDER — PROPOFOL 10 MG/ML IV BOLUS
INTRAVENOUS | Status: AC
  Filled 2019-05-30: qty 20

## 2019-05-30 MED ORDER — OXYTOCIN 40 UNITS IN NORMAL SALINE INFUSION - SIMPLE MED: 2.5000 [IU]/h | INTRAVENOUS | Status: AC

## 2019-05-30 MED ORDER — ONDANSETRON HCL 4 MG/2ML IJ SOLN
INTRAMUSCULAR | Status: AC
  Filled 2019-05-30: qty 2

## 2019-05-30 MED ORDER — MIDAZOLAM HCL 2 MG/2ML IJ SOLN
INTRAMUSCULAR | Status: DC | PRN
  Administered 2019-05-30: 2 mg via INTRAVENOUS

## 2019-05-30 MED ORDER — LACTATED RINGERS IV SOLN: INTRAVENOUS | Status: DC

## 2019-05-30 MED ORDER — DEXAMETHASONE SODIUM PHOSPHATE 10 MG/ML IJ SOLN
INTRAMUSCULAR | Status: AC
  Filled 2019-05-30: qty 1

## 2019-05-30 MED ORDER — ONDANSETRON HCL 4 MG/2ML IJ SOLN
INTRAMUSCULAR | Status: DC | PRN
  Administered 2019-05-30: 4 mg via INTRAVENOUS

## 2019-05-30 MED ORDER — SODIUM CHLORIDE 0.9 % IV SOLN
INTRAVENOUS | Status: AC
  Filled 2019-05-30: qty 500

## 2019-05-30 MED ORDER — DIBUCAINE (PERIANAL) 1 % EX OINT: 1.0000 "application " | TOPICAL_OINTMENT | CUTANEOUS | Status: DC | PRN

## 2019-05-30 MED ORDER — IBUPROFEN 800 MG PO TABS
800.0000 mg | ORAL_TABLET | Freq: Four times a day (QID) | ORAL | Status: DC
  Filled 2019-05-30 (×2): qty 1

## 2019-05-30 MED ORDER — SENNOSIDES-DOCUSATE SODIUM 8.6-50 MG PO TABS
2.0000 | ORAL_TABLET | ORAL | Status: DC
  Administered 2019-05-31 – 2019-06-02 (×2): 2 via ORAL
  Filled 2019-05-30 (×2): qty 2

## 2019-05-30 MED ORDER — PHENYLEPHRINE 40 MCG/ML (10ML) SYRINGE FOR IV PUSH (FOR BLOOD PRESSURE SUPPORT)
PREFILLED_SYRINGE | INTRAVENOUS | Status: AC
  Filled 2019-05-30: qty 10

## 2019-05-30 MED ORDER — ACETAMINOPHEN 325 MG PO TABS
650.0000 mg | ORAL_TABLET | Freq: Four times a day (QID) | ORAL | Status: DC | PRN
  Administered 2019-05-30 – 2019-06-02 (×8): 650 mg via ORAL
  Filled 2019-05-30 (×8): qty 2

## 2019-05-30 MED ORDER — KETOROLAC TROMETHAMINE 30 MG/ML IJ SOLN: 30.0000 mg | Freq: Once | INTRAMUSCULAR | Status: DC | PRN

## 2019-05-30 MED ORDER — PROMETHAZINE HCL 25 MG/ML IJ SOLN: 6.2500 mg | INTRAMUSCULAR | Status: DC | PRN

## 2019-05-30 MED ORDER — CEFAZOLIN SODIUM-DEXTROSE 2-4 GM/100ML-% IV SOLN
INTRAVENOUS | Status: AC
  Filled 2019-05-30: qty 100

## 2019-05-30 MED ORDER — TRANEXAMIC ACID-NACL 1000-0.7 MG/100ML-% IV SOLN
INTRAVENOUS | Status: AC
  Filled 2019-05-30: qty 100

## 2019-05-30 MED ORDER — SIMETHICONE 80 MG PO CHEW
80.0000 mg | CHEWABLE_TABLET | ORAL | Status: DC
  Administered 2019-05-31 – 2019-06-02 (×2): 80 mg via ORAL
  Filled 2019-05-30 (×2): qty 1

## 2019-05-30 MED ORDER — MEPERIDINE HCL 25 MG/ML IJ SOLN: 6.2500 mg | INTRAMUSCULAR | Status: DC | PRN

## 2019-05-30 MED ORDER — TRANEXAMIC ACID-NACL 1000-0.7 MG/100ML-% IV SOLN
INTRAVENOUS | Status: DC | PRN
  Administered 2019-05-30: 1000 mg via INTRAVENOUS

## 2019-05-30 MED ORDER — MENTHOL 3 MG MT LOZG: 1.0000 | LOZENGE | OROMUCOSAL | Status: DC | PRN

## 2019-05-30 MED ORDER — MIDAZOLAM HCL 2 MG/2ML IJ SOLN
INTRAMUSCULAR | Status: AC
  Filled 2019-05-30: qty 2

## 2019-05-30 MED ORDER — OXYTOCIN 40 UNITS IN NORMAL SALINE INFUSION - SIMPLE MED
INTRAVENOUS | Status: DC | PRN
  Administered 2019-05-30: 40 [IU] via INTRAVENOUS

## 2019-05-30 MED ORDER — DIPHENHYDRAMINE HCL 25 MG PO CAPS: 25.0000 mg | ORAL_CAPSULE | Freq: Four times a day (QID) | ORAL | Status: DC | PRN

## 2019-05-30 MED ORDER — PROPOFOL 10 MG/ML IV BOLUS
INTRAVENOUS | Status: DC | PRN
  Administered 2019-05-30: 200 mg via INTRAVENOUS

## 2019-05-30 MED ORDER — HYDROMORPHONE HCL 1 MG/ML IJ SOLN
0.2500 mg | INTRAMUSCULAR | Status: DC | PRN
  Administered 2019-05-30: 0.25 mg via INTRAVENOUS
  Administered 2019-05-30: 0.5 mg via INTRAVENOUS

## 2019-05-30 MED ORDER — SUCCINYLCHOLINE CHLORIDE 200 MG/10ML IV SOSY
PREFILLED_SYRINGE | INTRAVENOUS | Status: AC
  Filled 2019-05-30: qty 10

## 2019-05-30 MED ORDER — OXYTOCIN 40 UNITS IN NORMAL SALINE INFUSION - SIMPLE MED
1.0000 m[IU]/min | INTRAVENOUS | Status: DC
  Administered 2019-05-30: 2 m[IU]/min via INTRAVENOUS

## 2019-05-30 MED ORDER — SIMETHICONE 80 MG PO CHEW: 80.0000 mg | CHEWABLE_TABLET | ORAL | Status: DC | PRN

## 2019-05-30 MED ORDER — TERBUTALINE SULFATE 1 MG/ML IJ SOLN
0.2500 mg | Freq: Once | INTRAMUSCULAR | Status: AC | PRN
  Administered 2019-05-30: 0.25 mg via SUBCUTANEOUS
  Filled 2019-05-30: qty 1

## 2019-05-30 MED ORDER — TETANUS-DIPHTH-ACELL PERTUSSIS 5-2.5-18.5 LF-MCG/0.5 IM SUSP: 0.5000 mL | Freq: Once | INTRAMUSCULAR | Status: DC

## 2019-05-30 MED ORDER — FENTANYL CITRATE (PF) 100 MCG/2ML IJ SOLN
INTRAMUSCULAR | Status: AC
  Filled 2019-05-30: qty 2

## 2019-05-30 MED ORDER — OXYCODONE HCL 5 MG PO TABS
5.0000 mg | ORAL_TABLET | ORAL | Status: DC | PRN
  Administered 2019-05-31 (×2): 10 mg via ORAL
  Administered 2019-05-31: 5 mg via ORAL
  Administered 2019-06-01: 03:00:00 10 mg via ORAL
  Administered 2019-06-01 – 2019-06-02 (×2): 5 mg via ORAL
  Filled 2019-05-30: qty 2
  Filled 2019-05-30: qty 1
  Filled 2019-05-30 (×2): qty 2
  Filled 2019-05-30: qty 1
  Filled 2019-05-30: qty 2
  Filled 2019-05-30: qty 1

## 2019-05-30 MED ORDER — SUCCINYLCHOLINE CHLORIDE 20 MG/ML IJ SOLN
INTRAMUSCULAR | Status: DC | PRN
  Administered 2019-05-30: 170 mg via INTRAVENOUS

## 2019-05-30 MED ORDER — WITCH HAZEL-GLYCERIN EX PADS: 1.0000 "application " | MEDICATED_PAD | CUTANEOUS | Status: DC | PRN

## 2019-05-30 MED ORDER — PRENATAL MULTIVITAMIN CH
1.0000 | ORAL_TABLET | Freq: Every day | ORAL | Status: DC
  Administered 2019-05-31 – 2019-06-02 (×3): 1 via ORAL
  Filled 2019-05-30 (×3): qty 1

## 2019-05-30 MED ORDER — COCONUT OIL OIL
1.0000 "application " | TOPICAL_OIL | Status: DC | PRN
  Administered 2019-05-31: 1 via TOPICAL

## 2019-05-30 MED ORDER — SIMETHICONE 80 MG PO CHEW
80.0000 mg | CHEWABLE_TABLET | Freq: Three times a day (TID) | ORAL | Status: DC
  Administered 2019-05-31 – 2019-06-02 (×7): 80 mg via ORAL
  Filled 2019-05-30 (×7): qty 1

## 2019-05-30 MED ORDER — DEXAMETHASONE SODIUM PHOSPHATE 10 MG/ML IJ SOLN
INTRAMUSCULAR | Status: DC | PRN
  Administered 2019-05-30: 10 mg via INTRAVENOUS

## 2019-05-30 MED ORDER — ZOLPIDEM TARTRATE 5 MG PO TABS: 5.0000 mg | ORAL_TABLET | Freq: Every evening | ORAL | Status: DC | PRN

## 2019-05-30 MED ORDER — SODIUM CHLORIDE 0.9 % IV SOLN: INTRAVENOUS | Status: DC | PRN

## 2019-05-30 MED ORDER — HYDROMORPHONE HCL 1 MG/ML IJ SOLN
INTRAMUSCULAR | Status: AC
  Filled 2019-05-30: qty 0.5

## 2019-05-30 MED ORDER — FENTANYL CITRATE (PF) 250 MCG/5ML IJ SOLN
INTRAMUSCULAR | Status: AC
  Filled 2019-05-30: qty 5

## 2019-05-30 SURGICAL SUPPLY — 33 items
BARRIER ADHS 3X4 INTERCEED (GAUZE/BANDAGES/DRESSINGS) IMPLANT
BENZOIN TINCTURE PRP APPL 2/3 (GAUZE/BANDAGES/DRESSINGS) ×2 IMPLANT
CHLORAPREP W/TINT 26ML (MISCELLANEOUS) ×3 IMPLANT
CLAMP CORD UMBIL (MISCELLANEOUS) IMPLANT
CLOSURE STERI STRIP 1/2 X4 (GAUZE/BANDAGES/DRESSINGS) ×2 IMPLANT
CLOTH BEACON ORANGE TIMEOUT ST (SAFETY) ×3 IMPLANT
DRSG OPSITE POSTOP 4X10 (GAUZE/BANDAGES/DRESSINGS) ×3 IMPLANT
ELECT REM PT RETURN 9FT ADLT (ELECTROSURGICAL) ×3
ELECTRODE REM PT RTRN 9FT ADLT (ELECTROSURGICAL) ×1 IMPLANT
EXTRACTOR VACUUM KIWI (MISCELLANEOUS) IMPLANT
GLOVE BIO SURGEON STRL SZ 6.5 (GLOVE) ×2 IMPLANT
GLOVE BIO SURGEONS STRL SZ 6.5 (GLOVE) ×1
GLOVE BIOGEL PI IND STRL 7.0 (GLOVE) ×2 IMPLANT
GLOVE BIOGEL PI INDICATOR 7.0 (GLOVE) ×4
GOWN STRL REUS W/TWL LRG LVL3 (GOWN DISPOSABLE) ×6 IMPLANT
KIT ABG SYR 3ML LUER SLIP (SYRINGE) IMPLANT
NDL HYPO 25X5/8 SAFETYGLIDE (NEEDLE) IMPLANT
NEEDLE HYPO 22GX1.5 SAFETY (NEEDLE) IMPLANT
NEEDLE HYPO 25X5/8 SAFETYGLIDE (NEEDLE) IMPLANT
NS IRRIG 1000ML POUR BTL (IV SOLUTION) ×3 IMPLANT
PACK C SECTION WH (CUSTOM PROCEDURE TRAY) ×3 IMPLANT
PAD OB MATERNITY 4.3X12.25 (PERSONAL CARE ITEMS) ×3 IMPLANT
PENCIL SMOKE EVAC W/HOLSTER (ELECTROSURGICAL) ×3 IMPLANT
RETRACTOR WND ALEXIS 25 LRG (MISCELLANEOUS) IMPLANT
RTRCTR WOUND ALEXIS 25CM LRG (MISCELLANEOUS)
SUT VIC AB 0 CT1 36 (SUTURE) ×18 IMPLANT
SUT VIC AB 2-0 CT1 27 (SUTURE) ×2
SUT VIC AB 2-0 CT1 TAPERPNT 27 (SUTURE) ×1 IMPLANT
SUT VIC AB 4-0 PS2 27 (SUTURE) ×3 IMPLANT
SYR CONTROL 10ML LL (SYRINGE) IMPLANT
TOWEL OR 17X24 6PK STRL BLUE (TOWEL DISPOSABLE) ×3 IMPLANT
TRAY FOLEY W/BAG SLVR 14FR LF (SET/KITS/TRAYS/PACK) IMPLANT
WATER STERILE IRR 1000ML POUR (IV SOLUTION) ×3 IMPLANT

## 2019-05-30 NOTE — Anesthesia Procedure Notes (Signed)
Performed by: Ilham Roughton A, CRNA       

## 2019-05-30 NOTE — Progress Notes (Signed)
Labor Progress Note Ruth Solomon is a 19 y.o. G1P0 at [redacted]w[redacted]d presented for IOL for thrombocytopenia, gHTN  S:  Comfortable, feeling occasional ctx.   O:  BP 126/83   Pulse (!) 59   Temp 98.3 F (36.8 C) (Oral)   Resp 18   Ht 5\' 2"  (1.575 m)   Wt 79.8 kg   LMP 09/13/2018 (Exact Date)   SpO2 96%   BMI 32.19 kg/m  EFM: baseline 140 bpm/ mod variability/ + accels/ rare variable decels  Toco/IUPC: 1.5-3 SVE: 5/70/-2 Pitocin: 6 mu/min  A/P: 19 y.o. G1P0 [redacted]w[redacted]d  1. Labor: latent 2. FWB: Cat II 3. Pain: analgesia/anesthesia prn 4. gHTN-stable 5. Thrombocytopenia- stable @82k   Making cervical change, continue Pitocin. Declines analgesia or epidural at this time, made aware epidural cannot be placed if plt count falls below 80k. CBC q6. Anticipate labor progress and SVD.  [redacted]w[redacted]d, CNM 10:21 AM

## 2019-05-30 NOTE — Progress Notes (Signed)
Patient ID: Ruth Solomon, female   DOB: 13-Oct-2000, 19 y.o.   MRN: [id number] Has had a few deep variable decelerations  Over all variability is decreased  UCs frequent at times then spaced out  Dilation: 4 Effacement (%): 80 Cervical Position: Middle Station: Ballotable Presentation: Vertex Exam by:: Jordan Hawks, RN  Vitals:   05/30/19 0430 05/30/19 0500 05/30/19 0530 05/30/19 0600  BP: 130/84 130/79 123/76 125/76  Pulse: 61 (!) 57 62 (!) 52  Resp: 18 16 16 16   Temp: 98.3 F (36.8 C)     TempSrc: Oral     SpO2:      Weight:      Height:       Will stop Pitocin for 30 min and then if FHR pattern is Category I, will restart Head is too ballotable for AROM/internals/amnioinfusion just yet  Would plan on doing that asap

## 2019-05-30 NOTE — Transfer of Care (Signed)
Immediate Anesthesia Transfer of Care Note  Patient: Ruth Solomon  Procedure(s) Performed: CESAREAN SECTION (N/A Abdomen)  Patient Location: PACU  Anesthesia Type:General  Level of Consciousness: sedated  Airway & Oxygen Therapy: Patient Spontanous Breathing and Patient connected to nasal cannula oxygen  Post-op Assessment: Report given to RN  Post vital signs: Reviewed and stable  Last Vitals:  Vitals Value Taken Time  BP    Temp    Pulse 116 05/30/19 1649  Resp 24 05/30/19 1649  SpO2 95 % 05/30/19 1649  Vitals shown include unvalidated device data.  Last Pain:  Vitals:   05/30/19 1301  TempSrc:   PainSc: 1          Complications: No apparent anesthesia complications

## 2019-05-30 NOTE — Anesthesia Preprocedure Evaluation (Signed)
Anesthesia Evaluation  Patient identified by MRN, date of birth, ID band Patient awake    Reviewed: Allergy & Precautions, NPO status , Patient's Chart, lab work & pertinent test results  Airway Mallampati: I       Dental no notable dental hx. (+) Teeth Intact   Pulmonary neg pulmonary ROS,    Pulmonary exam normal        Cardiovascular negative cardio ROS   Rhythm:Regular Rate:Normal     Neuro/Psych negative neurological ROS  negative psych ROS   GI/Hepatic negative GI ROS, Neg liver ROS,   Endo/Other  negative endocrine ROS  Renal/GU negative Renal ROS  negative genitourinary   Musculoskeletal negative musculoskeletal ROS (+)   Abdominal (+) + obese,   Peds  Hematology  (+) Blood dyscrasia, ,   Anesthesia Other Findings   Reproductive/Obstetrics                             Anesthesia Physical Anesthesia Plan  ASA: II and emergent  Anesthesia Plan: General   Post-op Pain Management:    Induction: Intravenous, Cricoid pressure planned and Rapid sequence  PONV Risk Score and Plan: 4 or greater and Ondansetron, Dexamethasone and Scopolamine patch - Pre-op  Airway Management Planned: Oral ETT  Additional Equipment: None  Intra-op Plan:   Post-operative Plan: Extubation in OR  Informed Consent: I have reviewed the patients History and Physical, chart, labs and discussed the procedure including the risks, benefits and alternatives for the proposed anesthesia with the patient or authorized representative who has indicated his/her understanding and acceptance.     Only emergency history available  Plan Discussed with: CRNA  Anesthesia Plan Comments:         Anesthesia Quick Evaluation

## 2019-05-30 NOTE — Anesthesia Postprocedure Evaluation (Signed)
Anesthesia Post Note  Patient: Ruth Solomon  Procedure(s) Performed: CESAREAN SECTION (N/A Abdomen)     Patient location during evaluation: PACU Anesthesia Type: General Level of consciousness: awake Pain management: pain level not controlled Vital Signs Assessment: post-procedure vital signs reviewed and stable Respiratory status: spontaneous breathing Cardiovascular status: stable Postop Assessment: no apparent nausea or vomiting Anesthetic complications: no    Last Vitals:  Vitals:   05/30/19 1713 05/30/19 1731  BP:    Pulse: (!) 101 91  Resp: 17 10  Temp:    SpO2: 98% 93%    Last Pain:  Vitals:   05/30/19 1713  TempSrc:   PainSc: 8    Pain Goal:                   Caren Macadam

## 2019-05-30 NOTE — Anesthesia Procedure Notes (Signed)
Procedure Name: Intubation Date/Time: 05/30/2019 3:52 PM Performed by: Asher Muir, CRNA Pre-anesthesia Checklist: Patient identified, Emergency Drugs available, Suction available and Patient being monitored Patient Re-evaluated:Patient Re-evaluated prior to induction Oxygen Delivery Method: Circle system utilized Preoxygenation: Pre-oxygenation with 100% oxygen Induction Type: IV induction Ventilation: Oral airway inserted - appropriate to patient size Laryngoscope Size: Mac, 3 and Glidescope (Lopro 3) Grade View: Grade I Tube type: Oral Tube size: 7.0 mm Number of attempts: 1 Airway Equipment and Method: Stylet and Video-laryngoscopy Placement Confirmation: ETT inserted through vocal cords under direct vision,  positive ETCO2 and breath sounds checked- equal and bilateral Secured at: 21 cm Tube secured with: Tape Dental Injury: Teeth and Oropharynx as per pre-operative assessment

## 2019-05-30 NOTE — Op Note (Signed)
Ruth Solomon PROCEDURE DATE: 05/30/2019  Verbal consent obtained for Code Cesarean.   PREOPERATIVE DIAGNOSES: Intrauterine pregnancy at [redacted]w[redacted]d weeks gestation; fetal bradycardia  POSTOPERATIVE DIAGNOSES: The same  PROCEDURE: Primary Low Transverse Cesarean Section  SURGEON:  Dr. Emeterio Reeve - Primary Dr. Barrington Ellison - Fellow  ANESTHESIOLOGY TEAM: Anesthesiologist: Lyn Hollingshead, MD CRNA: Asher Muir, CRNA; Hewitt Blade, CRNA  INDICATIONS: Ruth Solomon is a 19 y.o. G1P0 at [redacted]w[redacted]d here for cesarean section secondary to the indications listed under preoperative diagnoses; please see preoperative note for further details.  The risks of cesarean section were discussed with the patient including but were not limited to: bleeding which may require transfusion or reoperation; infection which may require antibiotics; injury to bowel, bladder, ureters or other surrounding organs; injury to the fetus; need for additional procedures including hysterectomy in the event of a life-threatening hemorrhage; placental abnormalities wth subsequent pregnancies, incisional problems, thromboembolic phenomenon and other postoperative/anesthesia complications.   The patient concurred with the proposed plan, giving informed written consent for the procedure.    FINDINGS:  Viable female infant in cephalic presentation. Clear amniotic fluid.  Intact placenta, three vessel cord.  Normal uterus, fallopian tubes and ovaries bilaterally. APGAR (1 MIN): 6   APGAR (5 MINS): 8   APGAR (10 MINS):    ANESTHESIA: General  INTRAVENOUS FLUIDS: 1500 ml   ESTIMATED BLOOD LOSS: 320 ml URINE OUTPUT:  200 ml SPECIMENS: Placenta sent to pathology COMPLICATIONS: None immediate  PROCEDURE IN DETAIL:  The patient preoperatively received intravenous antibiotics and had sequential compression devices applied to her lower extremities.  She was then taken to the operating room where general anesthesia was given due to  code cesarean in setting of low platelets and no previous epidural placed. She was then placed in a dorsal supine position with a leftward tilt, and prepped and draped in a sterile manner.  A foley catheter was placed into her bladder and attached to constant gravity.  After an adequate timeout was performed, a Pfannenstiel skin incision was made with scalpel and carried through to the underlying layer of fascia. The fascia was incised in the midline, and this incision was extended bilaterally bluntly.  The rectus muscles were separated in the midline and the peritoneum was entered bluntly. Attention was turned to the lower uterine segment where a low transverse hysterotomy was made with a scalpel and extended bilaterally bluntly.  The infant was successfully delivered, the cord was clamped and cut within 15 seconds due to poor tone and the infant was handed over to the awaiting neonatology team. Uterine massage was then administered, and the placenta delivered intact with a three-vessel cord. The uterus was then cleared of clots and debris.  The hysterotomy was closed with 0 Vicryl in a running locked fashion, and an imbricating layer was also placed with 0 Vicryl.  Figure-of-eight 0 Vicryl serosal stitches were placed to help with hemostasis.  The pelvis was cleared of all clot and debris. Hemostasis was confirmed on all surfaces.  The retractor was removed.  The peritoneum was closed with a 2-0 Vicryl running stitch. The fascia was then closed using 0 Vicryl in a running fashion.  The subcutaneous layer was irrigated, reapproximated with 2-0 plain gut running stitches and the skin was closed with a 4-0 Vicryl subcuticular stitch. The patient tolerated the procedure well. Sponge, instrument and needle counts were correct x 3.  She was taken to the recovery room in stable condition.   Barrington Ellison, MD Cyndie Mull  Family Medicine Fellow, [facility] for [employer], [facility]

## 2019-05-30 NOTE — Progress Notes (Signed)
Labor Progress Note [name] is a 19 y.o. G1P0 at [redacted]w[redacted]d presented for IOL for ITP and gHTN  S:  Feeling some ctx, declines pain meds.  O:  BP 127/77   Pulse (!) 55   Temp 98.2 F (36.8 C)   Resp 18   Ht 5\' 2"  (1.575 m)   Wt 79.8 kg   LMP 09/13/2018 (Exact Date)   SpO2 99%   BMI 32.19 kg/m  EFM: baseline 140 bpm/ mod variability/ no accels/ no decels  Toco/IUPC: 2-5 SVE: Dilation: 5 Effacement (%): 80 Cervical Position: Middle Station: Ballotable Presentation: Vertex Exam by:: [name] Pitocin: 4 mu/min  A/P: 19 y.o. G1P0 [redacted]w[redacted]d  1. Labor: latent 2. FWB: Cat I 3. Pain: analgesia/NO prn 4. ITP: Plt 67, recheck in 6 hrs  No cervical change, recommend AROM, pt agrees. AROM for large amt clear fluid. Continue Pitocin. Consider steroids if plt drop further. Anticipate labor progress and SVD.  [redacted]w[redacted]d, CNM 3:04 PM

## 2019-05-30 NOTE — Progress Notes (Addendum)
Patient ID: Ruth Solomon, female   DOB: 22-Feb-2001, 19 y.o.   MRN: [id number] Vitals:   05/30/19 0300 05/30/19 0358 05/30/19 0402 05/30/19 0430  BP: 136/84 (!) 144/90 131/83 130/84  Pulse: (!) 58 73 (!) 56 61  Resp: 18   18  Temp:    98.3 F (36.8 C)  TempSrc:    Oral  SpO2:      Weight:      Height:       Fetal heart rate 130s with average variability and + accels UCs every 1.5-2 min  Dilation: 2.5 Effacement (%): 70 Cervical Position: Middle Station: -3 Presentation: Vertex Exam by:: Jordan Hawks, RN  Bloody show, balloon still in when RN checked her at 0130 Balloon came out about 0200.  Will recheck about 0530

## 2019-05-30 NOTE — Discharge Summary (Addendum)
Postpartum Discharge Summary     Patient Name: Ruth Solomon DOB: 05-12-2000 MRN: [id number]  Date of admission: 05/29/2019 Delivering Provider: Woodroe Mode   Date of discharge: 06/01/2019  Admitting diagnosis: Encounter for induction of labor [Z34.90] Intrauterine pregnancy: [redacted]w[redacted]d    Secondary diagnosis:  Active Problems:   Benign gestational thrombocytopenia in third trimester ([place]   Borderline FGR   Encounter for induction of labor   Thrombocytopenia ([place]   Delivery of pregnancy by cesarean section   Postpartum anemia  Additional problems: None     Discharge diagnosis: Term Pregnancy Delivered, Gestational Hypertension and Anemia                                                                                                Post partum procedures:Nexplanon insertion  Augmentation: AROM, Pitocin, Cytotec and Foley Balloon  Complications: None  Hospital course:  Induction of Labor With Cesarean Section  19y.o. yo G1P0 at 328w1das admitted to the hospital 05/29/2019 for induction of labor. Patient had a labor course significant for receiving Cytotec, Foley balloon, Pitocin and AROM. Patient with slow progression as Pitocin turned off multiple times due to decelerations. The patient went for cesarean section due to fetal bradycardia, and delivered a Viable infant,05/30/2019  Membrane Rupture Time/Date: 3:01 PM ,05/30/2019   Details of operation can be found in separate operative Note.  Patient had an uncomplicated postpartum course. BP's monitored and 1 elevated BP PP. Received Nexplanon. Patient also with low platelets throughout pregnancy, repeat CBC after delivery 9.3. She is ambulating, tolerating a regular diet, passing flatus, and urinating well.  Patient is discharged home in stable condition on 06/01/19.                                   Delivery time: 3:54 PM    Magnesium Sulfate received: No BMZ received: No Rhophylac:No MMR:No Transfusion:No  Physical exam   Vitals:   05/31/19 0815 05/31/19 1707 05/31/19 2114 06/01/19 0515  BP: (!) 135/93 129/87 126/84 (!) 137/93  Pulse: 68 67 77 83  Resp: '18 18 18   ' Temp: 98.2 F (36.8 C) (!) 97.5 F (36.4 C) 98.5 F (36.9 C) 99.1 F (37.3 C)  TempSrc: Oral Oral Oral Oral  SpO2:  100% 99% 97%  Weight:      Height:       General: alert, cooperative and no distress Lochia: appropriate Uterine Fundus: firm Incision: Healing well with no significant drainage, Dressing is clean, dry, and intact DVT Evaluation: No evidence of DVT seen on physical exam. No significant calf/ankle edema. Labs: Lab Results  Component Value Date   WBC 18.7 (H) 05/31/2019   HGB 9.3 (L) 05/31/2019   HCT 28.9 (L) 05/31/2019   MCV 82.8 05/31/2019   PLT 82 (L) 05/31/2019   CMP Latest Ref Rng & Units 05/29/2019  Glucose 70 - 99 mg/dL 84  BUN 6 - 20 mg/dL 5(L)  Creatinine 0.44 - 1.00 mg/dL 0.61  Sodium 135 - 145 mmol/L 137  Potassium  3.5 - 5.1 mmol/L 3.8  Chloride 98 - 111 mmol/L 108  CO2 22 - 32 mmol/L 22  Calcium 8.9 - 10.3 mg/dL 8.7(L)  Total Protein 6.5 - 8.1 g/dL 5.4(L)  Total Bilirubin 0.3 - 1.2 mg/dL 0.4  Alkaline Phos 38 - 126 U/L 131(H)  AST 15 - 41 U/L 17  ALT 0 - 44 U/L 10   Edinburgh Score: Edinburgh Postnatal Depression Scale Screening Tool 05/30/2019  I have been able to laugh and see the funny side of things. (No Data)    Discharge instruction: per After Visit Summary and "Baby and Me Booklet".  After visit meds:  Allergies as of 06/01/2019   No Known Allergies     Medication List    TAKE these medications   acetaminophen 325 MG tablet Commonly known as: TYLENOL Take 2 tablets (650 mg total) by mouth every 6 (six) hours as needed for mild pain (temperature > 101.5.).   ferrous sulfate 325 (65 FE) MG tablet Take 1 tablet (325 mg total) by mouth daily with breakfast.   oxyCODONE 5 MG immediate release tablet Commonly known as: Oxy IR/ROXICODONE Take 1-2 tablets (5-10 mg total) by mouth every  4 (four) hours as needed for moderate pain.   prenatal multivitamin Tabs tablet Take 1 tablet by mouth daily at 12 noon.       Diet: routine diet  Activity: Advance as tolerated. Pelvic rest for 6 weeks.   Outpatient follow up:4 weeks Follow up Appt: Future Appointments  Date Time Provider [place]  07/24/2019  2:30 PM [facility] 2 [facility] None  07/24/2019  3:00 PM Orson Slick, MD [facility] None   Follow up Visit: [facility] Follow up in 2 week(s).   Specialty: Obstetrics and Gynecology Why: for incision check  Contact information: [address] [place], [place] [id number] [place] [phone]            Please schedule this patient for Postpartum visit in: 4 weeks with the following provider: Any provider Virtual For C/S patients schedule nurse incision check in weeks 2 weeks: yes Low risk pregnancy complicated by: HTN Delivery mode:  CS Anticipated Birth Control:  PP Nexplanon placed PP Procedures needed: BP and incision check  Schedule Integrated BH visit: no   Newborn Data: Live born female  Birth Weight: 2455g   APGAR: 6, 8  Newborn Delivery   Birth date/time: 05/30/2019 15:54:00 Delivery type: C-Section, Low Transverse Trial of labor: Yes C-section categorization: Primary      Baby Feeding: Breast Disposition:home with mother   06/01/2019 Lajean Manes, CNM

## 2019-05-31 ENCOUNTER — Encounter: Payer: Self-pay | Admitting: *Deleted

## 2019-05-31 LAB — CBC
HCT: 28.9 % — ABNORMAL LOW (ref 36.0–46.0)
Hemoglobin: 9.3 g/dL — ABNORMAL LOW (ref 12.0–15.0)
MCH: 26.6 pg (ref 26.0–34.0)
MCHC: 32.2 g/dL (ref 30.0–36.0)
MCV: 82.8 fL (ref 80.0–100.0)
Platelets: 82 10*3/uL — ABNORMAL LOW (ref 150–400)
RBC: 3.49 MIL/uL — ABNORMAL LOW (ref 3.87–5.11)
RDW: 14.1 % (ref 11.5–15.5)
WBC: 18.7 10*3/uL — ABNORMAL HIGH (ref 4.0–10.5)
nRBC: 0 % (ref 0.0–0.2)

## 2019-05-31 MED ORDER — LIDOCAINE HCL 1 % IJ SOLN
0.0000 mL | Freq: Once | INTRAMUSCULAR | Status: AC | PRN
  Administered 2019-06-01: 20 mL via INTRADERMAL
  Filled 2019-05-31: qty 20

## 2019-05-31 MED ORDER — ETONOGESTREL 68 MG ~~LOC~~ IMPL
68.0000 mg | DRUG_IMPLANT | Freq: Once | SUBCUTANEOUS | Status: AC
  Administered 2019-06-01: 68 mg via SUBCUTANEOUS
  Filled 2019-05-31: qty 1

## 2019-05-31 MED ORDER — FERROUS SULFATE 325 (65 FE) MG PO TABS
325.0000 mg | ORAL_TABLET | Freq: Every day | ORAL | Status: DC
  Administered 2019-06-01 – 2019-06-02 (×2): 325 mg via ORAL
  Filled 2019-05-31 (×2): qty 1

## 2019-05-31 NOTE — Lactation Note (Signed)
This note was copied from a baby's chart. Lactation Consultation Note Baby 9 hrs old. Attempted to BF. Baby has no interest in BF. Baby latched on but no suckling. Held baby on breast for 10 minutes in hopes of suckling. LPI information sheet given and reviewed. Discussed supplementing. Hand expression demonstrated. Glistening noted. Mom's breast are heavy. Great everted nipples. Positive changes during pregnancy.   Mom shown how to use DEBP & how to disassemble, clean, & reassemble parts. Mom knows to pump q3h for 15-20 min. Mom encouraged to feed baby 8-12 times/24 hours and with feeding cues. Mom encouraged to waken baby for feeding if baby hasn't cued in 3 hrs.  Discussed supplementing w/mom. Mom in agreement. Mom chose Donor milk. Reviewed milk storage and Donor milk prep and storage.  Mom is going to be breast/bottle at home. Extra slow flow (purple) nipple used w/10 ml DM. Baby took 6 ml.  Baby has a long tongue, tongue thrust frequently.   Newborn behavior, STS, importance of I&O, breast massage, BF positioning, support, milk storage, supply and demand discussed.  Encouraged mom to call for assistance or questions. Lactation brochure given.   Patient Name: Ruth Solomon SWNIO'E Date: 05/31/2019 Reason for consult: Initial assessment;Primapara;Infant < 6lbs;Early term 37-38.6wks   Maternal Data Has patient been taught Hand Expression?: Yes Does the patient have breastfeeding experience prior to this delivery?: No  Feeding Feeding Type: Donor Breast Milk Nipple Type: Extra Slow Flow  LATCH Score Latch: Too sleepy or reluctant, no latch achieved, no sucking elicited.  Audible Swallowing: None  Type of Nipple: Everted at rest and after stimulation  Comfort (Breast/Nipple): Soft / non-tender  Hold (Positioning): Full assist, staff holds infant at breast  LATCH Score: 4  Interventions Interventions: Breast feeding basics reviewed;Support pillows;Assisted  with latch;Position options;Skin to skin;Breast massage;Hand express;Breast compression;Adjust position;DEBP  Lactation Tools Discussed/Used Tools: Pump Breast pump type: Double-Electric Breast Pump WIC Program: Yes Pump Review: Setup, frequency, and cleaning;Milk Storage Initiated by:: Peri Jefferson RN IBCLC Date initiated:: 05/31/19   Consult Status Consult Status: Follow-up Date: 05/31/19(in pm) Follow-up type: In-patient    Charyl Dancer 05/31/2019, 1:33 AM

## 2019-05-31 NOTE — Lactation Note (Signed)
This note was copied from a baby's chart. Lactation Consultation Note  Patient Name: Ruth Solomon [id number] Date: 05/31/2019 Reason for consult: Follow-up assessment;Term;Infant < 6lbs   Baby 23 hours old. Mother is latching, pumping and supplementing w/ donor milk. Encouraged mother to continue to post pump q 3hours. Mother does not have DEBP.  Faxed WIC referral. Suggest mother call if she would like assistance w/ latching. Reviewed volume guidelines increasing per day of life and as baby desires.    Maternal Data    Feeding Feeding Type: Breast Fed  LATCH Score                   Interventions Interventions: DEBP  Lactation Tools Discussed/Used     Consult Status Consult Status: Follow-up Date: 06/01/19 Follow-up type: In-patient    Dahlia Byes [facility] 05/31/2019, 3:13 PM

## 2019-05-31 NOTE — Progress Notes (Addendum)
Subjective: Postpartum Day 1: Cesarean Delivery Patient reports tolerating PO and + flatus. Has been getting up to the bathroom.   Objective: Vital signs in last 24 hours: Temp:  [97.2 F (36.2 C)-98.2 F (36.8 C)] 98 F (36.7 C) (04/01 2300) Pulse Rate:  [53-119] 76 (04/01 2300) Resp:  [0-33] 18 (04/01 2300) BP: (116-140)/(62-97) 130/79 (04/01 2300) SpO2:  [92 %-100 %] 99 % (04/01 2300)  Physical Exam:  General: alert and no distress Lochia: appropriate Uterine Fundus: firm Incision: healing well, no significant drainage DVT Evaluation: no signs of DVT on exam   Recent Labs    05/30/19 1759 05/31/19 0516  HGB 10.8* 9.3*  HCT 34.4* 28.9*    Assessment/Plan: Status post Cesarean section. Doing well postoperatively.  Continue breast feeding.  Continue current care.   Mel Almond 05/31/2019, 8:06 AM

## 2019-05-31 NOTE — Progress Notes (Signed)
CSW received consult for hx of marijuana use.  Referral was screened out due to the following: °~MOB had no documented substance use after initial prenatal visit/+UPT. °~MOB had no positive drug screens after initial prenatal visit/+UPT. °~Baby's UDS is negative. ° °Please consult CSW if current concerns arise or by MOB's request. ° °CSW will monitor CDS results and make report to Child Protective Services if warranted. ° °Rhyatt Muska Boyd-Gilyard, MSW, LCSW °Clinical Social Work °[phone] ° °

## 2019-06-01 DIAGNOSIS — O9081 Anemia of the puerperium: Secondary | ICD-10-CM | POA: Diagnosis not present

## 2019-06-01 HISTORY — DX: Anemia of the puerperium: O90.81

## 2019-06-01 MED ORDER — FERROUS SULFATE 325 (65 FE) MG PO TABS: 325.0000 mg | ORAL_TABLET | Freq: Every day | ORAL | 3 refills | Status: DC

## 2019-06-01 MED ORDER — OXYCODONE HCL 5 MG PO TABS: 5.0000 mg | ORAL_TABLET | ORAL | 0 refills | Status: DC | PRN

## 2019-06-01 MED ORDER — ACETAMINOPHEN 500 MG PO TABS
1000.0000 mg | ORAL_TABLET | Freq: Once | ORAL | Status: AC
  Administered 2019-06-02: 1000 mg via ORAL
  Filled 2019-06-01: qty 2

## 2019-06-01 MED ORDER — ACETAMINOPHEN 325 MG PO TABS: 650.0000 mg | ORAL_TABLET | Freq: Four times a day (QID) | ORAL | 0 refills | Status: DC | PRN

## 2019-06-01 MED ORDER — FERROUS SULFATE 325 (65 FE) MG PO TABS: 325.0000 mg | ORAL_TABLET | Freq: Two times a day (BID) | ORAL | 3 refills | Status: DC

## 2019-06-01 NOTE — Procedures (Signed)
POSTPARTUM PROCEDURE NOTE  [name] is a 19 y.o. G1P1001 for  Nexplanon insertion. No GYN concerns.  No other gynecologic concerns.  Nexplanon Insertion Procedure Patient was given informed consent, she signed consent form.  Patient does understand that irregular bleeding is a very common side effect of this medication. Appropriate time out taken.  Patient's left arm was prepped and draped in the usual sterile fashion.. The ruler used to measure and mark insertion area.  Patient was prepped with alcohol swab and then injected with 3 ml of 1% lidocaine.  She was prepped with betadine, Nexplanon removed from packaging,  Device confirmed in needle, then inserted full length of needle and withdrawn per handbook instructions. Nexplanon was able to palpated in the patient's arm; patient palpated the insert herself. There was minimal blood loss.  Patient insertion site covered with guaze and a pressure bandage to reduce any bruising.  The patient tolerated the procedure well and was given post procedure instructions.   Sharyon Cable, CNM 06/01/2019 8:54 AM

## 2019-06-02 LAB — COMPREHENSIVE METABOLIC PANEL
ALT: 13 U/L (ref 0–44)
AST: 24 U/L (ref 15–41)
Albumin: 2.1 g/dL — ABNORMAL LOW (ref 3.5–5.0)
Alkaline Phosphatase: 103 U/L (ref 38–126)
Anion gap: 9 (ref 5–15)
BUN: 8 mg/dL (ref 6–20)
CO2: 23 mmol/L (ref 22–32)
Calcium: 8.3 mg/dL — ABNORMAL LOW (ref 8.9–10.3)
Chloride: 107 mmol/L (ref 98–111)
Creatinine, Ser: 0.66 mg/dL (ref 0.44–1.00)
GFR calc Af Amer: >60 mL/min (ref >60)
GFR calc non Af Amer: >60 mL/min (ref >60)
Glucose, Bld: 109 mg/dL — ABNORMAL HIGH (ref 70–99)
Potassium: 3.5 mmol/L (ref 3.5–5.1)
Sodium: 139 mmol/L (ref 135–145)
Total Bilirubin: 0.3 mg/dL (ref 0.3–1.2)
Total Protein: 5 g/dL — ABNORMAL LOW (ref 6.5–8.1)

## 2019-06-02 LAB — CBC
HCT: 29 % — ABNORMAL LOW (ref 36.0–46.0)
Hemoglobin: 9.3 g/dL — ABNORMAL LOW (ref 12.0–15.0)
MCH: 26.7 pg (ref 26.0–34.0)
MCHC: 32.1 g/dL (ref 30.0–36.0)
MCV: 83.3 fL (ref 80.0–100.0)
Platelets: 89 10*3/uL — ABNORMAL LOW (ref 150–400)
RBC: 3.48 MIL/uL — ABNORMAL LOW (ref 3.87–5.11)
RDW: 14.4 % (ref 11.5–15.5)
WBC: 9.5 10*3/uL (ref 4.0–10.5)
nRBC: 0 % (ref 0.0–0.2)

## 2019-06-02 MED ORDER — AMLODIPINE BESYLATE 5 MG PO TABS: 10.0000 mg | ORAL_TABLET | Freq: Every day | ORAL | 0 refills | Status: DC

## 2019-06-02 MED ORDER — NIFEDIPINE 10 MG PO CAPS
30.0000 mg | ORAL_CAPSULE | Freq: Once | ORAL | Status: AC
  Administered 2019-06-02: 30 mg via ORAL
  Filled 2019-06-02: qty 3

## 2019-06-02 MED ORDER — NIFEDIPINE 10 MG PO CAPS: 30.0000 mg | ORAL_CAPSULE | Freq: Three times a day (TID) | ORAL | Status: DC

## 2019-06-02 MED ORDER — AMLODIPINE BESYLATE 5 MG PO TABS
5.0000 mg | ORAL_TABLET | Freq: Every day | ORAL | Status: DC
  Administered 2019-06-02: 5 mg via ORAL
  Filled 2019-06-02: qty 1

## 2019-06-02 MED ORDER — AMLODIPINE BESYLATE 5 MG PO TABS: 5.0000 mg | ORAL_TABLET | Freq: Every day | ORAL | 0 refills | Status: DC

## 2019-06-02 NOTE — Progress Notes (Signed)
Dr. Rachael Darby ordered to give procardia and then recheck BP in one hour. See flowsheets. BP came down; however, HR increased. Patient has been ambulating in room each time this RN has been in to check her VS. Dr. Rachael Darby ordered for patient to rest for about 20-30 minutes and recheck HR. Patient states that she continues to feel her heart rate go up and down. HR ranged from 122-136. Notified Dr. Rachael Darby, who stated that she would come assess patient. Earl Gala, Linda Hedges [place]

## 2019-06-02 NOTE — Progress Notes (Signed)
Notified Dr. Rachael Darby of recent blood pressures after first dose of norvasc. Dr. Rachael Darby requested a manual blood pressure and plan to monitor patient's blood pressure for a few more hours. Earl Gala, Linda Hedges [place]

## 2019-06-02 NOTE — Lactation Note (Signed)
This note was copied from a baby's chart. Lactation Consultation Note  Patient Name: Ruth Solomon [id number] Date: 06/02/2019 Reason for consult: Follow-up assessment;Infant < 6lbs;Early term 37-38.6wks;Primapara Baby is 65 hours old/7% weight loss.  Mom's milk has come to volume.  Breasts are very full and slightly engorged.  Mom is currently pumping with 27 mm flanges which appear to tight for nipple.  Flange size increased to 30 mm and fit is better.  Instructed to use hands on pumping.  Ice packs provided to use between pumpings.  Mom is interested in a [facility] loaner and paperwork provided.  Reviewed outpatient services and encouraged to call prn.  Maternal Data    Feeding Feeding Type: Breast Fed  LATCH Score                   Interventions    Lactation Tools Discussed/Used Tools: Flanges Flange Size: 30   Consult Status Consult Status: Complete Follow-up type: Call as needed    Huston Foley 06/02/2019, 9:02 AM

## 2019-06-02 NOTE — Progress Notes (Signed)
After speaking with RN regarding patient's BP and heart rate, reassessed patient.  Patient is well appearing and would like to go home. Denies any chest discomfort, dizziness, headaches or shortness of breath. Red flag symptoms discussed and strict return precautions given. Patient to check her BP at home and follow up with her doctor for BP recheck.  Patient to take 10 mg amlodipine daily.  Patient verbalized understanding.    Katha Cabal, DO PGY-1, [facility] 06/02/2019 6:25 PM

## 2019-06-02 NOTE — Discharge Instructions (Signed)
Please take 10 mg Norvasc for elevated blood pressures.  Make an appointment with your doctor to recheck your blood pressure in 2-3 days.    [facility]     Cesarean Delivery, Care After This sheet gives you information about how to care for yourself after your procedure. Your health care provider may also give you more specific instructions. If you have problems or questions, contact your health care provider. What can I expect after the procedure? After the procedure, it is common to have:  A small amount of blood or clear fluid coming from the incision.  Some redness, swelling, and pain in your incision area.  Some abdominal pain and soreness.  Vaginal bleeding (lochia). Even though you did not have a vaginal delivery, you will still have vaginal bleeding and discharge.  Pelvic cramps.  Fatigue. You may have pain, swelling, and discomfort in the tissue between your vagina and your anus (perineum) if:  Your C-section was unplanned, and you were allowed to labor and push.  An incision was made in the area (episiotomy) or the tissue tore during attempted vaginal delivery. Follow these instructions at home: Incision care   Follow instructions from your health care provider about how to take care of your incision. Make sure you: ? Wash your hands with soap and water before you change your bandage (dressing). If soap and water are not available, use hand sanitizer. ? If you have a dressing, change it or remove it as told by your health care provider. ? Leave stitches (sutures), skin staples, skin glue, or adhesive strips in place. These skin closures may need to stay in place for 2 weeks or longer. If adhesive strip edges start to loosen and curl up, you may trim the loose edges. Do not remove adhesive strips completely unless your health care provider tells you to do that.  Check your incision area every day for signs of infection. Check for: ? More redness, swelling, or  pain. ? More fluid or blood. ? Warmth. ? Pus or a bad smell.  Do not take baths, swim, or use a hot tub until your health care provider says it's okay. Ask your health care provider if you can take showers.  When you cough or sneeze, hug a pillow. This helps with pain and decreases the chance of your incision opening up (dehiscing). Do this until your incision heals. Medicines  Take over-the-counter and prescription medicines only as told by your health care provider.  If you were prescribed an antibiotic medicine, take it as told by your health care provider. Do not stop taking the antibiotic even if you start to feel better.  Do not drive or use heavy machinery while taking prescription pain medicine. Lifestyle  Do not drink alcohol. This is especially important if you are breastfeeding or taking pain medicine.  Do not use any products that contain nicotine or tobacco, such as cigarettes, e-cigarettes, and chewing tobacco. If you need help quitting, ask your health care provider. Eating and drinking  Drink at least 8 eight-ounce glasses of water every day unless told not to by your health care provider. If you breastfeed, you may need to drink even more water.  Eat high-fiber foods every day. These foods may help prevent or relieve constipation. High-fiber foods include: ? Whole grain cereals and breads. ? Brown rice. ? Beans. ? Fresh fruits and vegetables. Activity   If possible, have someone help you care for your baby and help with household  activities for at least a few days after you leave the hospital.  Return to your normal activities as told by your health care provider. Ask your health care provider what activities are safe for you.  Rest as much as possible. Try to rest or take a nap while your baby is sleeping.  Do not lift anything that is heavier than 10 lbs (4.5 kg), or the limit that you were told, until your health care provider says that it is safe.  Talk  with your health care provider about when you can engage in sexual activity. This may depend on your: ? Risk of infection. ? How fast you heal. ? Comfort and desire to engage in sexual activity. General instructions  Do not use tampons or douches until your health care provider approves.  Wear loose, comfortable clothing and a supportive and well-fitting bra.  Keep your perineum clean and dry. Wipe from front to back when you use the toilet.  If you pass a blood clot, save it and call your health care provider to discuss. Do not flush blood clots down the toilet before you get instructions from your health care provider.  Keep all follow-up visits for you and your baby as told by your health care provider. This is important. Contact a health care provider if:  You have: ? A fever. ? Bad-smelling vaginal discharge. ? Pus or a bad smell coming from your incision. ? Difficulty or pain when urinating. ? A sudden increase or decrease in the frequency of your bowel movements. ? More redness, swelling, or pain around your incision. ? More fluid or blood coming from your incision. ? A rash. ? Nausea. ? Little or no interest in activities you used to enjoy. ? Questions about caring for yourself or your baby.  Your incision feels warm to the touch.  Your breasts turn red or become painful or hard.  You feel unusually sad or worried.  You vomit.  You pass a blood clot from your vagina.  You urinate more than usual.  You are dizzy or light-headed. Get help right away if:  You have: ? Pain that does not go away or get better with medicine. ? Chest pain. ? Difficulty breathing. ? Blurred vision or spots in your vision. ? Thoughts about hurting yourself or your baby. ? New pain in your abdomen or in one of your legs. ? A severe headache.  You faint.  You bleed from your vagina so much that you fill more than one sanitary pad in one hour. Bleeding should not be heavier than  your heaviest period. Summary  After the procedure, it is common to have pain at your incision site, abdominal cramping, and slight bleeding from your vagina.  Check your incision area every day for signs of infection.  Tell your health care provider about any unusual symptoms.  Keep all follow-up visits for you and your baby as told by your health care provider. This information is not intended to replace advice given to you by your health care provider. Make sure you discuss any questions you have with your health care provider. Document Revised: 08/23/2017 Document Reviewed: 08/23/2017 Elsevier Patient Education  2020 [employer].

## 2019-06-02 NOTE — Lactation Note (Signed)
This note was copied from a baby's chart. Lactation Consultation Note  Patient Name: Boy Clarine Elrod FXJOI'T Date: 06/02/2019  P1, 61 hour ETI female infant. Infant had 8 voids and 15 stools since birth. Mom has no concerns about infant feeding at breast, mom feels infant is latching well and she is happy her milk supply has increased. Mom is no longer using donor breast milk, mom has been pumping every 3 hours and when last pump mom expressed 50 mls . LC did not see a latch due mom breastfeeding infant for 20 minutes 1 hour prior to [facility] entering the room. Mom did not supplement infant at this time. Mom gave infant 25 mls of her EBM by bottle her choice. Mom will breastfeed infant and then supplement infant after latching infant at breast with her own EBM that she has pumped according infant age/ hours of life mom has breastfeeding supplemental guide sheet. Mom will continue to breastfeed infant according to hunger cues, 8 to 12 times within 24 hours and not exceed 3 hours without breastfeeding infant.  LC praised mom for her breastfeeding efforts and pumping as suggested previous by LC .     Maternal Data    Feeding Feeding Type: Breast Fed  LATCH Score                   Interventions    Lactation Tools Discussed/Used     Consult Status      Danelle Earthly 06/02/2019, 5:40 AM

## 2019-06-02 NOTE — Discharge Summary (Signed)
Postpartum Discharge Summary  Patient Name: Ruth Solomon DOB: [birth year] MRN: [id number]  Date of admission: 05/29/2019 Delivering Provider: [place] Mode   Date of discharge: 06/02/2019  Admitting diagnosis: Encounter for induction of labor [Z34.90] Intrauterine pregnancy: [redacted]w[redacted]d    Secondary diagnosis:  Active Problems:   Benign gestational thrombocytopenia in third trimester ([place]   Borderline FGR   Encounter for induction of labor   Thrombocytopenia ([place]   Delivery of pregnancy by cesarean section   Postpartum anemia  Additional problems: Gestational hypertension      Discharge diagnosis: Term Pregnancy Delivered, Gestational Hypertension and Anemia                                                                                                Post partum procedures:Nexplanon insertion   Augmentation: AROM, Pitocin, Cytotec and Foley Balloon  Complications: None  Hospital course:  Induction of Labor With Cesarean Section  19y.o. yo G1P0 at 361w1das admitted to the hospital 05/29/2019 for induction of labor. Patient had a labor course significant for receiving Cytotec, Foley balloon, Pitocin and AROM. Patient with slow progression as Pitocin turned off multiple times due to decelerations. The patient went for cesarean section due to fetal bradycardia, and delivered a Viable infant,05/30/2019  Membrane Rupture Time/Date: 3:01 PM ,05/30/2019   Details of operation can be found in separate operative Note.  Patient had an uncomplicated postpartum course. BP's monitored and 1 elevated BP PP. Received Nexplanon. Patient also with low platelets throughout pregnancy, repeat CBC after delivery 9.3. She is ambulating, tolerating a regular diet, passing flatus, and urinating well.  Patient is discharged home in stable condition on 06/01/19.    Delivery time: 3:54 PM    Magnesium Sulfate received: No BMZ received: No Rhophylac:N/A MMR:N/A Transfusion:No  Physical exam  Vitals:   06/01/19 1409 06/01/19 2200 06/01/19 2235 06/02/19 0510  BP: 134/77 (!) 152/100 (!) 155/104 (!) 152/104  Pulse: 86 69 67 67  Resp: '17 16  20  ' Temp: 98.3 F (36.8 C)   98 F (36.7 C)  TempSrc: Oral   Oral  SpO2: 99% 100%  100%  Weight:      Height:       General: alert, cooperative and no distress Lochia: appropriate Uterine Fundus: firm Incision: Healing well with no significant drainage, Dressing is clean, dry, and intact DVT Evaluation: No evidence of DVT seen on physical exam. No significant calf/ankle edema. Hypertension: started on Norvasc today for continued elevated BP after delivery. Patient denies HA, vision changes or RUQ pain. Repeat PEC labs negative. Patient reports having 1 HA last night that went away with hydration and water. Patient denies HA at this time.   Labs: Lab Results  Component Value Date   WBC 9.5 06/01/2019   HGB 9.3 (L) 06/01/2019   HCT 29.0 (L) 06/01/2019   MCV 83.3 06/01/2019   PLT 89 (L) 06/01/2019   CMP Latest Ref Rng & Units 06/01/2019  Glucose 70 - 99 mg/dL 109(H)  BUN 6 - 20 mg/dL 8  Creatinine 0.44 - 1.00 mg/dL 0.66  Sodium 135 -  145 mmol/L 139  Potassium 3.5 - 5.1 mmol/L 3.5  Chloride 98 - 111 mmol/L 107  CO2 22 - 32 mmol/L 23  Calcium 8.9 - 10.3 mg/dL 8.3(L)  Total Protein 6.5 - 8.1 g/dL 5.0(L)  Total Bilirubin 0.3 - 1.2 mg/dL 0.3  Alkaline Phos 38 - 126 U/L 103  AST 15 - 41 U/L 24  ALT 0 - 44 U/L 13   Edinburgh Score: Edinburgh Postnatal Depression Scale Screening Tool 06/01/2019  I have been able to laugh and see the funny side of things. 0  I have looked forward with enjoyment to things. 0  I have blamed myself unnecessarily when things went wrong. 0  I have been anxious or worried for no good reason. 0  I have felt scared or panicky for no good reason. 2  Things have been getting on top of me. 1  I have been so unhappy that I have had difficulty sleeping. 0  I have felt sad or miserable. 0  I have been so unhappy that I have  been crying. 0  The thought of harming myself has occurred to me. 0  Edinburgh Postnatal Depression Scale Total 3    Discharge instruction: per After Visit Summary and "Baby and Me Booklet".  After visit meds:  Allergies as of 06/02/2019   No Known Allergies     Medication List    TAKE these medications   acetaminophen 325 MG tablet Commonly known as: TYLENOL Take 2 tablets (650 mg total) by mouth every 6 (six) hours as needed for mild pain (temperature > 101.5.).   amLODipine 5 MG tablet Commonly known as: NORVASC Take 1 tablet (5 mg total) by mouth daily.   ferrous sulfate 325 (65 FE) MG tablet Take 1 tablet (325 mg total) by mouth 2 (two) times daily with a meal.   oxyCODONE 5 MG immediate release tablet Commonly known as: Oxy IR/ROXICODONE Take 1-2 tablets (5-10 mg total) by mouth every 4 (four) hours as needed for moderate pain.   prenatal multivitamin Tabs tablet Take 1 tablet by mouth daily at 12 noon.       Diet: routine diet  Activity: Advance as tolerated. Pelvic rest for 6 weeks.   Outpatient follow up:BP check in 1 week and PP appointment in 4 weeks  Follow up Appt: Future Appointments  Date Time Provider [place]  07/24/2019  2:30 PM [facility] 2 [facility] None  07/24/2019  3:00 PM Orson Slick, MD [facility] None   Follow up Visit: [place] [facility] Follow up in 2 week(s).   Specialty: Obstetrics and Gynecology Why: for incision check  Contact information: [address] [place], [place] [id number] [place] [id number] [phone]          Please schedule this patient for Postpartum visit in: 4 weeks with the following provider: Any provider Virtual For C/S patients schedule nurse incision check in weeks 2 weeks: yes Low risk pregnancy complicated by: HTN Delivery mode: CS Anticipated Birth Control: PP Nexplanon placed PP Procedures  needed: BP and incision check  Schedule Integrated BH visit: no   Newborn Data: Live born female  Birth Weight: 5 lb 6.6 oz (2455 g) APGAR: 6, 8  Newborn Delivery   Birth date/time: 05/30/2019 15:54:00 Delivery type: C-Section, Low Transverse Trial of labor: Yes C-section categorization: Primary      Baby Feeding: Breast Disposition:home with mother   06/02/2019 Lajean Manes, CNM

## 2019-06-02 NOTE — Progress Notes (Signed)
Patient did not go home yesterday d/t baby not being discharged. Please see discharge summary from today with updated documentation.  Sharyon Cable, CNM 06/02/19, 7:38 AM

## 2019-06-03 LAB — SURGICAL PATHOLOGY

## 2019-06-05 ENCOUNTER — Ambulatory Visit (INDEPENDENT_AMBULATORY_CARE_PROVIDER_SITE_OTHER): Payer: Medicaid Other

## 2019-06-05 ENCOUNTER — Encounter: Payer: Medicaid Other | Admitting: Family Medicine

## 2019-06-05 ENCOUNTER — Other Ambulatory Visit: Payer: Self-pay

## 2019-06-05 VITALS — BP 160/98 | HR 98

## 2019-06-05 DIAGNOSIS — Z0130 Encounter for examination of blood pressure without abnormal findings: Secondary | ICD-10-CM

## 2019-06-05 MED ORDER — ENALAPRIL MALEATE 10 MG PO TABS: 10.0000 mg | ORAL_TABLET | Freq: Every day | ORAL | 1 refills | Status: DC

## 2019-06-05 NOTE — Progress Notes (Signed)
Chart reviewed for nurse visit. Agree with plan of care.   Cai Flott L, DO 06/05/2019 5:39 PM

## 2019-06-05 NOTE — Progress Notes (Signed)
Pt here today for BP check s/p start of Norvasc 10 mg daily after having elevated BP's in labor.  Pt reports that she last took medication yesterday in the evening time.  Pt states that she took her BP this am at home and it was 150's/100's.  Pt denies headache or visual disturbances.  BP LA 153/106.  Rpt BP manually RA 160/98.  Notified Hailey Sparacino, DO who recommended that pt discontinue amlodipine 10 mg daily and begin taking Enalapril 10 mg po daily, BP check in one week, and a lab only BMP in two weeks to check kidney fxn.  Notified pt provider's recommendation and to continue to monitor for sx's HTN.  Pt verbalized understanding.   Addison Naegeli, RN 06/05/19

## 2019-06-12 ENCOUNTER — Telehealth (INDEPENDENT_AMBULATORY_CARE_PROVIDER_SITE_OTHER): Payer: Medicaid Other | Admitting: General Practice

## 2019-06-12 ENCOUNTER — Other Ambulatory Visit: Payer: Self-pay

## 2019-06-12 VITALS — BP 139/87 | HR 112

## 2019-06-12 DIAGNOSIS — Z0130 Encounter for examination of blood pressure without abnormal findings: Secondary | ICD-10-CM

## 2019-06-12 DIAGNOSIS — Z5189 Encounter for other specified aftercare: Secondary | ICD-10-CM

## 2019-06-12 NOTE — Progress Notes (Signed)
Patient presents for mychart video visit for blood pressure check & incision check following primary c-section on 4/1. Patient did come into the office for BP check on 4/7- BP was elevated 160/98. Patient was on Norvasc 5mg  at the time. Norvasc was discontinued and patient was started on Vasotec 10mg . Patient reports taking medication daily as prescribed. She reports occasional headaches that improve with rest. Denies dizziness/blurry vision & edema. BP 139/87- reviewed with Dr who states patient should continue medication and follow up at pp visit. Incision appears to be healing well. Steri Strips present. Reviewed wound care and signs & symptoms of infection with patient. Patient verbalized understanding & will follow up at postpartum visit on 4/28.  Mayford Knife RN BSN 06/12/19

## 2019-06-13 ENCOUNTER — Encounter: Payer: Medicaid Other | Admitting: Obstetrics & Gynecology

## 2019-06-13 ENCOUNTER — Other Ambulatory Visit: Payer: Medicaid Other

## 2019-06-17 ENCOUNTER — Encounter: Payer: Self-pay | Admitting: *Deleted

## 2019-06-19 ENCOUNTER — Other Ambulatory Visit: Payer: Self-pay | Admitting: *Deleted

## 2019-06-19 ENCOUNTER — Other Ambulatory Visit: Payer: Medicaid Other

## 2019-06-19 ENCOUNTER — Other Ambulatory Visit: Payer: Self-pay

## 2019-06-19 DIAGNOSIS — O0993 Supervision of high risk pregnancy, unspecified, third trimester: Secondary | ICD-10-CM

## 2019-06-20 LAB — BASIC METABOLIC PANEL
BUN/Creatinine Ratio: 14 (ref 9–23)
BUN: 11 mg/dL (ref 6–20)
CO2: 22 mmol/L (ref 20–29)
Calcium: 9.4 mg/dL (ref 8.7–10.2)
Chloride: 108 mmol/L — ABNORMAL HIGH (ref 96–106)
Creatinine, Ser: 0.8 mg/dL (ref 0.57–1.00)
GFR calc Af Amer: 122 mL/min/{1.73_m2} (ref >59)
GFR calc non Af Amer: 106 mL/min/{1.73_m2} (ref >59)
Glucose: 75 mg/dL (ref 65–99)
Potassium: 3.8 mmol/L (ref 3.5–5.2)
Sodium: 145 mmol/L — ABNORMAL HIGH (ref 134–144)

## 2019-06-26 ENCOUNTER — Encounter: Payer: Self-pay | Admitting: Advanced Practice Midwife

## 2019-06-26 ENCOUNTER — Other Ambulatory Visit: Payer: Self-pay

## 2019-06-26 ENCOUNTER — Telehealth (INDEPENDENT_AMBULATORY_CARE_PROVIDER_SITE_OTHER): Payer: Medicaid Other | Admitting: Advanced Practice Midwife

## 2019-06-26 NOTE — Patient Instructions (Signed)
AREA FAMILY PRACTICE PHYSICIANS  [place] ([id number]) . [facility] o [address] [place], [place] [address] o [phone] o Mon-Fri 8:30-12:30, 1:30-5:00 o Accepting Medicaid . [facility] o [address], [place], [place] [address] o [phone] o Mon-Fri 8:00-5:30 . [facility] o [address] [place], [place] [address] o [phone] o Mon, Tue, Thur, Fri 8:30-5:00, Wed 10:00-7:00 (closed 1-2pm) o Accepting Medicaid . [facility] o [address], [place], [place]  [address] o Phone - [phone]   Fax - [phone]  [place] ([address]) . [facility] o [address] [place], [place] [address] o [phone] o Mon-Fri 8:00-5:00 . [facility] ([facility]  o [address] [place], [place] [address] o [phone] o Mon-Fri 8:30-5:30, Sat (Oct.-Mar.) 9:00-1:00 o Accepting Medicaid  [place] ([address]) . [facility] o [address], [place], [place] [address] o [phone] o Mon-Fri 8:00-5:00  [place] ([address]) . [facility] o [address], [place], [place] [address] o [phone] o Mon-Fri 8:00-5:00 . [facility] o [address], [place], [place] [address] o [phone] o Mon-Fri 8:00-5:00 . [facility] o [address] [place], [place] [address] o [phone] o Mon-Fri 8:00-5:00 . [facility] o [address] [place] [address] o [phone] o Mon-Fri 7:30-5:30  [place] ([address] & [address]) . [facility] o [address] [place], [place] [address] o [phone] o Mon-Thur 8:00-6:00 o Accepting Medicaid . [facility] o [address] [place], [place] [address] o [phone] o Mon-Thur 7:30-7:30, Fri 7:30-4:30 o Accepting Medicaid . [facility] o [address], [place], [place]  [address] o [phone]   Fax - [phone]  [place] ([address] & [address]) . [facility] o [address] [place], [place] [address] o [phone] o Mon-Fri 7:00-5:00 . [facility] o [address], [place], [place] [address] o [phone] o Mon-Fri 8:00-5:00 o Accepting Medicaid . [facility] o [address], , [place] [address] o [phone] o Mon-Fri 8:00-5:00 o Accepting Medicaid  [place] ([address]) . [facility] [place] o [address] [place], [place] [address] o [phone] o Mon-Fri 8:00-5:00 . [facility] o [address], [place], [place] [address] o [phone] o Mon-Fri 8:00-5:00 o Accepting Medicaid . [facility] o [address], [place], [place] [address] o [phone] o Mon-Fri 8:00-5:30, Sat&Sun by appointment (phones open at 8:30) o Accepting Medicaid  [place] ([address]) . [facility] o [address] [place], [place] [address] o [phone] o Mon-Thur 8:00-7:00, Fri 8:00-5:00, Sat 8:00-12:00, Sun 9:00-12:00 o Accepting Medicaid . [facility] [place] o [address], [place], [place] [address] o [phone] o Mon-Thur 8:00-5:00 o Accepting Medicaid . [facility] o [address] [place],  [address] o [phone] o Mon-Fri 8:00-5:30, Sat (Oct.-Mar.) 9:00-1:00 o Accepting Medicaid  [place]  ([address]) .   [facility] o [address], [place], [place] [address] o [phone] o Mon-Fri 8:00-5:00 o Accepting Medicaid   [place] ([address]) . [facility] [place] o [address], [place], [place] [address] o [phone] o Mon-Fri 8:00-5:00 . [facility] [place] o [address], [place], [place] [address] o [phone] o Mon-Fri 8:00-5:00 . [facility] [place] o [address] [place] [address], [place], [place] [address] o [phone] o Mon-Fri 8:00-5:00 o After hours clinic ([address] [place], [place] [address]) [phone] Mon-Fri 5:00-8:00, Sat 12:00-6:00, Sun 10:00-4:00 o Accepting Medicaid . [facility] [place] o [address], [place], [place]  [address] o [phone]   Fax - [phone]  [place] ([address]) . [facility] [place] o [address], [place],  [address] o [phone] o Mon-Fri 8:00-5:00 . [facility] [place] ([facility] [place]) o [address], [place],  [address] o [phone] o Mon-Thur 8:00-7:00, Fri 8:00-5:00, Sat 8:00-12:00    

## 2019-06-26 NOTE — Progress Notes (Signed)
Post Partum Visit Note  Ruth Solomon is a 19 y.o. G17P1001 female who presents for a postpartum visit. She is 4 weeks postpartum following a Primary Low Transverse Cesarean Section.  I have fully reviewed the prenatal and intrapartum course. The delivery was at 38.1 gestational weeks.  Anesthesia: spinal. Postpartum course has been unremarkable. Baby is doing well. Baby is feeding by both breast and bottle - gerber. Bleeding no bleeding. Bowel function is normal. Bladder function is normal. Patient is not sexually active. Contraception method is Nexplanon. Postpartum depression screening: negative.   The following portions of the patient's history were reviewed and updated as appropriate: allergies, current medications, past family history, past medical history, past social history, past surgical history and problem list.  Review of Systems Pertinent items are noted in HPI.    Objective:  Blood pressure 133/82, pulse 93, last menstrual period 09/13/2018, unknown if currently breastfeeding.  Exam limited due to virtual visit   Edinburgh Postnatal Depression Scale - 06/26/19 1414      Edinburgh Postnatal Depression Scale:  In the Past 7 Days   I have been able to laugh and see the funny side of things.  0    I have looked forward with enjoyment to things.  0    I have blamed myself unnecessarily when things went wrong.  0    I have been anxious or worried for no good reason.  0    I have felt scared or panicky for no good reason.  0    Things have been getting on top of me.  0    I have been so unhappy that I have had difficulty sleeping.  0    I have felt sad or miserable.  0    I have been so unhappy that I have been crying.  0    The thought of harming myself has occurred to me.  0    Edinburgh Postnatal Depression Scale Total  0        Assessment:    normal postpartum exam. Pap smear at next visit.  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 Will need PCP Follow up   Plan:   Essential  components of care per ACOG recommendations:  1.  Mood and well being: Patient with negative depression screening today. Reviewed local resources for support.  - Patient does not use tobacco.  - hx of drug use? No    2. Infant care and feeding:  -Patient currently breastmilk feeding? Yes If breastmilk feeding discussed return to work and pumping. If needed, patient was offered letter for work to allow for every 2-3 hr pumping breaks, and to be granted a private location to express breastmilk and refrigerated area to store breastmilk. Reviewed importance of draining breast regularly to support lactation. -Social determinants of health (SDOH) reviewed in EPIC. No concerns 3. Sexuality, contraception and birth spacing - Patient does not want a pregnancy in the next year.  Desired family size is unsure number of children. Plans to keep Nexplanon for full 3 years before next baby.  - Reviewed forms of contraception in tiered fashion. Patient desired Nexplanon today.   - Discussed birth spacing of 18 months  4. Sleep and fatigue -Encouraged family/partner/community support of 4 hrs of uninterrupted sleep to help with mood and fatigue  5. Physical Recovery  - Discussed patients delivery and complications - Patient has urinary incontinence? No Patient was referred to pelvic floor PT  - Patient is safe to resume physical  and sexual activity  6.  Health Maintenance - Last pap smear done never   7. Chronic Disease Likely CHTN  133/82 blood pressure today. Patient on Vasotec 10mg  daily. She reports that she is taking this as prescribed. Recommended patient continue this medication at this time - PCP follow up: patient does not have a PCP. List given. Patient advised to call here for appt if she is unable to get an appt with any of the PCPs.   Marcille Buffy DNP, CNM  06/26/19  2:32 PM  Center for [employer], [facility]

## 2019-07-23 NOTE — Progress Notes (Signed)
[place] [phone]   [phone]  PROGRESS NOTE  Patient Care Team: Patient, Ruth Ruth Solomon as PCP - General (General Practice)  Hematological/Oncological History #Thrombocytopenia 1) 10/15/2018: WBC 8.7, hgb 13.2, MCV 84.1, Plt 109 2) 12/20/2018: WBC 9.4, Hgb 12.6, MCV 78, Plt 102 3) 04/10/2019: WBC 10.1, Hgb 11.6, MCV 82, Plt 92. Noted to be [redacted] weeks pregnant 4) 04/24/2019: establish care with Dr. Lorenso Courier. WBC 10.1, Hgb 12.0, Plt 87, MCV 83.7 5) 05/30/2019: delivery of full term healthy baby boy via C-section.  WBC 12.7, Hgb 11.4, MCV 82.4, Plt 90 6) 07/24/2019: f/u visit with Dr. Lorenso Courier. WBC 5.6, Hgb 12.2, MCV 82, Plt 110  Interval History:  Ruth Ruth Solomon 19 y.o. female with medical history significant for thrombocytopenia in pregnancy who presents for a follow up visit. The patient's last visit was on 04/24/2019. In the interim since the last visit she delivered a health baby boy on 05/03/4654 with an uncomplicated postpartum course.   On exam today Ruth Ruth Solomon that she has been doing well since the time of delivery.  She notes that bleeding in and around the time of the procedure was minimal and that she had Ruth Solomon issues with postpartum hemorrhage.  She notes that her menstrual cycles have not started up back yet since the delivery was only about 8 weeks ago.  She notes that her baby boy is quite healthy and happy and doing well.  She notes that she has not had any other issues since her last clinic visit.  She denies any bruising, bleeding, dark stools, or other overt signs of blood loss.  A full 10 point ROS is listed below.  MEDICAL HISTORY:  Past Medical History:  Diagnosis Date  . Thrombocytopenia ([place])   . Thrombocytopenia ([place])     SURGICAL HISTORY: Past Surgical History:  Procedure Laterality Date  . CESAREAN SECTION N/A 05/30/2019   Procedure: CESAREAN SECTION;  Surgeon: Woodroe Mode, MD;  [facility]S;  Service: Obstetrics;   Laterality: N/A;  . FOREIGN BODY REMOVAL Left    sewing needle removed from big toe    SOCIAL HISTORY: Social History   Socioeconomic History  . Marital status: Single    Spouse name: Not on file  . Number of children: Not on file  . Years of education: Not on file  . Highest education level: Not on file  Occupational History  . Not on file  Tobacco Use  . Smoking status: Never Smoker  . Smokeless tobacco: Never Used  Substance and Sexual Activity  . Alcohol use: Never  . Drug use: Not Currently    Types: Marijuana    Comment: last use a few months ago  . Sexual activity: Not Currently    Birth control/protection: None  Other Topics Concern  . Not on file  Social History Narrative  . Not on file   Social Determinants of Health   Financial Resource Strain:   . Difficulty of Paying Living Expenses:   Food Ruth Solomon: Ruth Ruth Solomon  . Worried About Charity fundraiser in the Last Year: Never true  . Ran Out of Food in the Last Year: Never true  Transportation Solomon: Ruth Ruth Solomon  . Lack of Transportation (Medical): Ruth Solomon  . Lack of Transportation (Non-Medical): Ruth Solomon  Physical Activity:   . Days of Exercise Solomon Week:   . Minutes of Exercise Solomon Session:   Stress:   . Feeling of Stress :   Social Connections:   .  Frequency of Communication with Friends and Family:   . Frequency of Social Gatherings with Friends and Family:   . Attends Religious Services:   . Active Member of Clubs or Organizations:   . Attends Archivist Meetings:   Marland Kitchen Marital Status:   Intimate Partner Violence:   . Fear of Current or Ex-Partner:   . Emotionally Abused:   Marland Kitchen Physically Abused:   . Sexually Abused:     FAMILY HISTORY: Family History  Problem Relation Age of Onset  . Healthy Mother   . Healthy Father     Ruth Solomon:  has Ruth Ruth Solomon.  MEDICATIONS:  Current Outpatient Medications  Medication Sig Dispense Refill  . acetaminophen (TYLENOL) 325 MG  tablet Take 2 tablets (650 mg total) by mouth every 6 (six) hours as needed for mild pain (temperature > 101.5.). (Patient not taking: Reported on 06/05/2019) 30 tablet 0  . amLODipine (NORVASC) 5 MG tablet Take 2 tablets (10 mg total) by mouth daily. (Patient not taking: Reported on 06/26/2019) 30 tablet 0  . enalapril (VASOTEC) 10 MG tablet Take 1 tablet (10 mg total) by mouth daily. 30 tablet 1  . ferrous sulfate 325 (65 FE) MG tablet Take 1 tablet (325 mg total) by mouth 2 (two) times daily with a meal. (Patient not taking: Reported on 06/26/2019) 30 tablet 3  . oxyCODONE (OXY IR/ROXICODONE) 5 MG immediate release tablet Take 1-2 tablets (5-10 mg total) by mouth every 4 (four) hours as needed for moderate pain. (Patient not taking: Reported on 06/26/2019) 25 tablet 0  . Prenatal Vit-Fe Fumarate-FA (PRENATAL MULTIVITAMIN) TABS tablet Take 1 tablet by mouth daily at 12 noon.     Ruth Solomon current facility-administered medications for this visit.    REVIEW OF SYSTEMS:   Constitutional: ( - ) fevers, ( - )  chills , ( - ) night sweats Eyes: ( - ) blurriness of vision, ( - ) double vision, ( - ) watery eyes Ears, nose, mouth, throat, and face: ( - ) mucositis, ( - ) sore throat Respiratory: ( - ) cough, ( - ) dyspnea, ( - ) wheezes Cardiovascular: ( - ) palpitation, ( - ) chest discomfort, ( - ) lower extremity swelling Gastrointestinal:  ( - ) nausea, ( - ) heartburn, ( - ) change in bowel habits Skin: ( - ) abnormal skin rashes Lymphatics: ( - ) new lymphadenopathy, ( - ) easy bruising Neurological: ( - ) numbness, ( - ) tingling, ( - ) new weaknesses Behavioral/Psych: ( - ) mood change, ( - ) new changes  All other systems were reviewed with the patient and are negative.  PHYSICAL EXAMINATION: ECOG PERFORMANCE STATUS: 1 - Symptomatic but completely ambulatory  Vitals:   07/24/19 1533  BP: 119/80  Pulse: 84  Resp: 17  Temp: 97.7 F (36.5 C)  SpO2: 100%   Filed Weights   07/24/19 1533   Weight: 151 lb 8 oz (68.7 kg)    GENERAL: well appearing young African American femalealert, Ruth Solomon distress and comfortable SKIN: skin color, texture, turgor are normal, Ruth Solomon rashes or significant lesions EYES: conjunctiva are pink and non-injected, sclera clear LUNGS: clear to auscultation and percussion with normal breathing effort HEART: regular rate & rhythm and Ruth Solomon murmurs and Ruth Solomon lower extremity edema ABDOMEN: soft, non-tender, non-distended, normal bowel sounds. Transverse C section scar well healing Musculoskeletal: Ruth Solomon cyanosis of digits and Ruth Solomon clubbing  PSYCH: alert & oriented x 3, fluent speech NEURO: Ruth Solomon focal motor/sensory deficits  LABORATORY DATA:  I have reviewed the data as listed CBC Latest Ref Rng & Units 07/24/2019 06/01/2019 05/31/2019  WBC 4.0 - 10.5 K/uL 5.6 9.5 18.7(H)  Hemoglobin 12.0 - 15.0 g/dL 12.2 9.3(L) 9.3(L)  Hematocrit 36.0 - 46.0 % 38.6 29.0(L) 28.9(L)  Platelets 150 - 400 K/uL 110(L) 89(L) 82(L)    CMP Latest Ref Rng & Units 06/19/2019 06/01/2019 05/29/2019  Glucose 65 - 99 mg/dL 75 109(H) 84  BUN 6 - 20 mg/dL 11 8 5(L)  Creatinine 0.57 - 1.00 mg/dL 0.80 0.66 0.61  Sodium 134 - 144 mmol/L 145(H) 139 137  Potassium 3.5 - 5.2 mmol/L 3.8 3.5 3.8  Chloride 96 - 106 mmol/L 108(H) 107 108  CO2 20 - 29 mmol/L '22 23 22  ' Calcium 8.7 - 10.2 mg/dL 9.4 8.3(L) 8.7(L)  Total Protein 6.5 - 8.1 g/dL - 5.0(L) 5.4(L)  Total Bilirubin 0.3 - 1.2 mg/dL - 0.3 0.4  Alkaline Phos 38 - 126 U/L - 103 131(H)  AST 15 - 41 U/L - 24 17  ALT 0 - 44 U/L - 13 10    ASSESSMENT & PLAN Ruth Ruth Solomon 19 y.o. female with medical history significant for thrombocytopenia in pregnancy. who presents for a follow up visit.  In the interim since the last visit the patient has had a mild rebound in her platelet count up to approximately 110.  This is elevated from her platelet count of approximately 90 during her pregnancy.  At this time it is not clear why the patient has a subnormal platelet count.   At this time she does not have any other hematological abnormalities and therefore I think we can hold on a bone marrow biopsy.  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  If she would develop other hematological abnormalities or bleeding complications we could proceed with further work-up and evaluation.  Overall at this time given that she is clinically stable and was able to successfully deliver without complication I think we can continue to monitor her platelet count.  I would recommend a 45-monthlab visit to determine if platelets have rebounded followed by a 188-monthollow-up clinic visit just to check in and assure that her other counts are stable.  #Gestational Thrombocytopenia in 3rd Trimester #Idiopathic Thrombocytopenia --patient is approximately 8 weeks out from delivery, with continued low platelet count. It has increased to 110k from 67k on day of delivery --this represents a mild thrombocytopenia. She should not be at risk for spontaneous bleeding. Additionally with a Plt count >100k she should be able to tolerate most surgical procedures --etiology of her thrombocytopenia is unclear at this time. Ruth Solomon clumping noted on peripheral blood film and she has Ruth Solomon other cytopenias  --Ruth Solomon strong indication for a bone marrow biopsy at this time --recommend repeat lab check in 6 months to see if platelets remain at this baseline  --RTC in 12 months time.   Ruth Solomon orders of the defined types were placed in this encounter.   All questions were answered. The patient knows to call the clinic with any problems, questions or concerns.  A total of more than 20 minutes were spent on this encounter and over half of that time was spent on counseling and coordination of care as outlined above.   JoLedell PeoplesMD Department of Hematology/Oncology [facility]st [facility] Inc PcPhone: [phone]: [phone]  Email: [email or web address]  07/25/2019 9:57 AM

## 2019-07-24 ENCOUNTER — Other Ambulatory Visit: Payer: Self-pay | Admitting: Hematology and Oncology

## 2019-07-24 ENCOUNTER — Inpatient Hospital Stay: Payer: Medicaid Other

## 2019-07-24 ENCOUNTER — Other Ambulatory Visit: Payer: Self-pay

## 2019-07-24 ENCOUNTER — Inpatient Hospital Stay: Payer: Medicaid Other | Attending: Hematology and Oncology | Admitting: Hematology and Oncology

## 2019-07-24 VITALS — BP 119/80 | HR 84 | Temp 97.7°F | Resp 17 | Ht 62.0 in | Wt 151.5 lb

## 2019-07-24 DIAGNOSIS — O99113 Other diseases of the blood and blood-forming organs and certain disorders involving the immune mechanism complicating pregnancy, third trimester: Secondary | ICD-10-CM

## 2019-07-24 DIAGNOSIS — D693 Immune thrombocytopenic purpura: Secondary | ICD-10-CM | POA: Diagnosis not present

## 2019-07-24 DIAGNOSIS — D696 Thrombocytopenia, unspecified: Secondary | ICD-10-CM

## 2019-07-24 LAB — SAVE SMEAR(SSMR), FOR PROVIDER SLIDE REVIEW

## 2019-07-24 LAB — CBC WITH DIFFERENTIAL (CANCER CENTER ONLY)
Abs Immature Granulocytes: 0.02 10*3/uL (ref 0.00–0.07)
Basophils Absolute: 0 10*3/uL (ref 0.0–0.1)
Basophils Relative: 1 %
Eosinophils Absolute: 0.1 10*3/uL (ref 0.0–0.5)
Eosinophils Relative: 1 %
HCT: 38.6 % (ref 36.0–46.0)
Hemoglobin: 12.2 g/dL (ref 12.0–15.0)
Immature Granulocytes: 0 %
Lymphocytes Relative: 41 %
Lymphs Abs: 2.3 10*3/uL (ref 0.7–4.0)
MCH: 25.9 pg — ABNORMAL LOW (ref 26.0–34.0)
MCHC: 31.6 g/dL (ref 30.0–36.0)
MCV: 82 fL (ref 80.0–100.0)
Monocytes Absolute: 0.3 10*3/uL (ref 0.1–1.0)
Monocytes Relative: 6 %
Neutro Abs: 2.9 10*3/uL (ref 1.7–7.7)
Neutrophils Relative %: 51 %
Platelet Count: 110 10*3/uL — ABNORMAL LOW (ref 150–400)
RBC: 4.71 MIL/uL (ref 3.87–5.11)
RDW: 13.4 % (ref 11.5–15.5)
WBC Count: 5.6 10*3/uL (ref 4.0–10.5)
nRBC: 0 % (ref 0.0–0.2)

## 2019-07-25 ENCOUNTER — Encounter: Payer: Self-pay | Admitting: Hematology and Oncology

## 2020-01-17 ENCOUNTER — Ambulatory Visit (HOSPITAL_COMMUNITY)
Admission: EM | Admit: 2020-01-17 | Discharge: 2020-01-17 | Disposition: A | Discharge status: Home / Self Care | Payer: Medicaid Other | Attending: Family Medicine | Admitting: Family Medicine

## 2020-01-17 ENCOUNTER — Encounter (HOSPITAL_COMMUNITY): Payer: Self-pay

## 2020-01-17 ENCOUNTER — Other Ambulatory Visit: Payer: Self-pay

## 2020-01-17 DIAGNOSIS — N390 Urinary tract infection, site not specified: Secondary | ICD-10-CM | POA: Diagnosis not present

## 2020-01-17 DIAGNOSIS — R300 Dysuria: Secondary | ICD-10-CM

## 2020-01-17 DIAGNOSIS — R350 Frequency of micturition: Secondary | ICD-10-CM | POA: Diagnosis present

## 2020-01-17 LAB — POCT URINALYSIS DIPSTICK, ED / UC
Glucose, UA: NEGATIVE mg/dL
Ketones, ur: 15 mg/dL — AB
Nitrite: NEGATIVE
Protein, ur: 100 mg/dL — AB
Specific Gravity, Urine: 1.02 (ref 1.005–1.030)
Urobilinogen, UA: 4 mg/dL — ABNORMAL HIGH (ref 0.0–1.0)
pH: 8.5 — ABNORMAL HIGH (ref 5.0–8.0)

## 2020-01-17 MED ORDER — CEPHALEXIN 500 MG PO CAPS: 500.0000 mg | ORAL_CAPSULE | Freq: Two times a day (BID) | ORAL | 0 refills | Status: DC

## 2020-01-17 NOTE — ED Provider Notes (Signed)
[facility]    CSN: [id number] Arrival date & time: 01/17/20  0856      History   Chief Complaint Chief Complaint  Patient presents with  . Dysuria    HPI Ruth Solomon is a 19 y.o. female.   Presenting today with 2 day hx of urinary frequency, pressure, and discomfort with and after urination. Does also note some vaginal odor. Otherwise denies discharge, hematuria, flank pain, fever, chills, N/V/D, concern for STIs. Has nexplanon in place, and is not sexually active. Has not tried anything OTC for sxs.      Past Medical History:  Diagnosis Date  . Thrombocytopenia (H[facility])   . Thrombocytopenia [facility])     Patient Active Problem List   Diagnosis Date Noted  . Delivery of pregnancy by cesarean section 06/01/2019  . Postpartum anemia 06/01/2019  . Thrombocytopenia (HCC) 05/30/2019  . Encounter for induction of labor 05/29/2019  . Borderline FGR 04/29/2019  . Benign gestational thrombocytopenia in third trimester (HCC) 04/10/2019  . Supervision of high risk pregnancy in third trimester 04/10/2019    Past Surgical History:  Procedure Laterality Date  . CESAREAN SECTION N/A 05/30/2019   Procedure: CESAREAN SECTION;  Surgeon: Adam Phenix, MD;  Location: [facility] LD ORS;  Service: Obstetrics;  Laterality: N/A;  . FOREIGN BODY REMOVAL Left    sewing needle removed from big toe    OB History    Gravida  1   Para  1   Term  1   Preterm      AB      Living  1     SAB      TAB      Ectopic      Multiple  0   Live Births  1            Home Medications    Prior to Admission medications   Medication Sig Start Date End Date Taking? Authorizing Provider  etonogestrel (NEXPLANON) 68 MG IMPL implant 1 each by Subdermal route once.   Yes [provider]  acetaminophen (TYLENOL) 325 MG tablet Take 2 tablets (650 mg total) by mouth every 6 (six) hours as needed for mild pain (temperature > 101.5.). Patient not taking: Reported on 06/05/2019  06/01/19   Sandre Kitty, MD  amLODipine (NORVASC) 5 MG tablet Take 2 tablets (10 mg total) by mouth daily. Patient not taking: Reported on 06/26/2019 06/02/19   Katha Cabal, DO  cephALEXin (KEFLEX) 500 MG capsule Take 1 capsule (500 mg total) by mouth 2 (two) times daily. 01/17/20   Particia Nearing, PA-C  enalapril (VASOTEC) 10 MG tablet Take 1 tablet (10 mg total) by mouth daily. 06/05/19   Sparacino, Hailey L, DO  ferrous sulfate 325 (65 FE) MG tablet Take 1 tablet (325 mg total) by mouth 2 (two) times daily with a meal. Patient not taking: Reported on 06/26/2019 06/01/19   Sharyon Cable, CNM  oxyCODONE (OXY IR/ROXICODONE) 5 MG immediate release tablet Take 1-2 tablets (5-10 mg total) by mouth every 4 (four) hours as needed for moderate pain. Patient not taking: Reported on 06/26/2019 06/01/19   Sandre Kitty, MD  Prenatal Vit-Fe Fumarate-FA (PRENATAL MULTIVITAMIN) TABS tablet Take 1 tablet by mouth daily at 12 noon.    [provider]    Family History Family History  Problem Relation Age of Onset  . Healthy Mother   . Healthy Father     Social History Social History   Tobacco  Use  . Smoking status: Never Smoker  . Smokeless tobacco: Never Used  Vaping Use  . Vaping Use: Never used  Substance Use Topics  . Alcohol use: Never  . Drug use: Not Currently    Types: Marijuana    Comment: last use a few months ago     Allergies   Patient has no known allergies.   Review of Systems Review of Systems PER HPI    Physical Exam Triage Vital Signs ED Triage Vitals  Enc Vitals Group     BP 01/17/20 1020 119/73     Pulse Rate 01/17/20 1020 60     Resp 01/17/20 1020 16     Temp 01/17/20 1020 97.9 F (36.6 C)     Temp Source 01/17/20 1020 Oral     SpO2 01/17/20 1020 100 %     Weight --      Height --      Head Circumference --      Peak Flow --      Pain Score 01/17/20 1019 0     Pain Loc --      Pain Edu? --      Excl. in GC? --    No data  found.  Updated Vital Signs BP 119/73 (BP Location: Right Arm)   Pulse 60   Temp 97.9 F (36.6 C) (Oral)   Resp 16   LMP 12/13/2019 (Exact Date)   SpO2 100%   Visual Acuity Right Eye Distance:   Left Eye Distance:   Bilateral Distance:    Right Eye Near:   Left Eye Near:    Bilateral Near:     Physical Exam Vitals and nursing note reviewed.  Constitutional:      Appearance: Normal appearance. She is not ill-appearing.  HENT:     Head: Atraumatic.     Nose: Nose normal.     Mouth/Throat:     Mouth: Mucous membranes are moist.     Pharynx: Oropharynx is clear.  Eyes:     Extraocular Movements: Extraocular movements intact.     Conjunctiva/sclera: Conjunctivae normal.  Cardiovascular:     Rate and Rhythm: Normal rate and regular rhythm.     Heart sounds: Normal heart sounds.  Pulmonary:     Effort: Pulmonary effort is normal.     Breath sounds: Normal breath sounds.  Abdominal:     General: Bowel sounds are normal. There is no distension.     Palpations: Abdomen is soft.     Tenderness: There is no abdominal tenderness. There is no right CVA tenderness, left CVA tenderness or guarding.  Genitourinary:    Comments: GU exam deferred, self swab performed Musculoskeletal:        General: Normal range of motion.     Cervical back: Normal range of motion and neck supple.  Skin:    General: Skin is warm and dry.  Neurological:     Mental Status: She is alert and oriented to person, place, and time.  Psychiatric:        Mood and Affect: Mood normal.        Thought Content: Thought content normal.        Judgment: Judgment normal.     UC Treatments / Results  Labs (all labs ordered are listed, but only abnormal results are displayed) Labs Reviewed  POCT URINALYSIS DIPSTICK, ED / UC - Abnormal; Notable for the following components:      Result Value   Bilirubin Urine SMALL (*)  Ketones, ur 15 (*)    Hgb urine dipstick SMALL (*)    pH 8.5 (*)    Protein, ur  100 (*)    Urobilinogen, UA 4.0 (*)    Leukocytes,Ua SMALL (*)    All other components within normal limits  URINE CULTURE  CERVICOVAGINAL ANCILLARY ONLY    EKG   Radiology No results found.  Procedures Procedures (including critical care time)  Medications Ordered in UC Medications - No data to display  Initial Impression / Assessment and Plan / UC Course  I have reviewed the triage vital signs and the nursing notes.  Pertinent labs & imaging results that were available during my care of the patient were reviewed by me and considered in my medical decision making (see chart for details).     U/A showing some dehydration, pH abnormality and leuks. Will send out aptima swab checking for BV and urine culture. Start keflex in meantime in case true UTI though my suspicion is BV causing her sxs. Return precautions reviewed.   Final Clinical Impressions(s) / UC Diagnoses   Final diagnoses:  Lower urinary tract infectious disease  Urinary frequency   Discharge Instructions   None    ED Prescriptions    Medication Sig Dispense Auth. Provider   cephALEXin (KEFLEX) 500 MG capsule Take 1 capsule (500 mg total) by mouth 2 (two) times daily. 10 capsule Particia Nearing, [place]     PDMP not reviewed this encounter.   Particia Nearing, [place] 01/17/20 1152

## 2020-01-17 NOTE — ED Triage Notes (Signed)
Pt presents with discomfort and burning sensation with urination, increase urinary frequency since lats night. Denies fever, nausea, back pain.

## 2020-01-19 LAB — URINE CULTURE: Culture: >=100000 — AB

## 2020-01-20 ENCOUNTER — Telehealth (HOSPITAL_COMMUNITY): Payer: Self-pay | Admitting: Emergency Medicine

## 2020-01-20 LAB — CERVICOVAGINAL ANCILLARY ONLY
Bacterial Vaginitis (gardnerella): NEGATIVE
Candida Glabrata: NEGATIVE
Candida Vaginitis: POSITIVE — AB
Comment: NEGATIVE
Comment: NEGATIVE
Comment: NEGATIVE

## 2020-01-20 MED ORDER — FLUCONAZOLE 150 MG PO TABS: 150.0000 mg | ORAL_TABLET | Freq: Once | ORAL | 0 refills | Status: AC

## 2020-01-21 ENCOUNTER — Telehealth (HOSPITAL_COMMUNITY): Payer: Self-pay | Admitting: Emergency Medicine

## 2020-01-21 MED ORDER — FLUCONAZOLE 150 MG PO TABS: 150.0000 mg | ORAL_TABLET | Freq: Once | ORAL | 0 refills | Status: AC

## 2020-01-21 NOTE — Telephone Encounter (Signed)
Changing pharmacy.

## 2020-01-30 ENCOUNTER — Inpatient Hospital Stay: Payer: Medicaid Other

## 2020-02-18 ENCOUNTER — Ambulatory Visit: Payer: Medicaid Other | Admitting: Advanced Practice Midwife

## 2020-05-02 ENCOUNTER — Other Ambulatory Visit: Payer: Self-pay

## 2020-05-02 ENCOUNTER — Ambulatory Visit (HOSPITAL_COMMUNITY)
Admission: EM | Admit: 2020-05-02 | Discharge: 2020-05-02 | Disposition: A | Discharge status: Home / Self Care | Payer: Medicaid Other | Attending: Family Medicine | Admitting: Family Medicine

## 2020-05-02 DIAGNOSIS — N926 Irregular menstruation, unspecified: Secondary | ICD-10-CM | POA: Insufficient documentation

## 2020-05-02 LAB — POCT URINALYSIS DIPSTICK, ED / UC
Bilirubin Urine: NEGATIVE
Glucose, UA: NEGATIVE mg/dL
Hgb urine dipstick: NEGATIVE
Ketones, ur: 15 mg/dL — AB
Leukocytes,Ua: NEGATIVE
Nitrite: NEGATIVE
Protein, ur: NEGATIVE mg/dL
Specific Gravity, Urine: >=1.03 (ref 1.005–1.030)
Urobilinogen, UA: 1 mg/dL (ref 0.0–1.0)
pH: 5 (ref 5.0–8.0)

## 2020-05-02 LAB — HCG, QUANTITATIVE, PREGNANCY: hCG, Beta Chain, Quant, S: 1 m[IU]/mL (ref <5)

## 2020-05-02 LAB — POC URINE PREG, ED: Preg Test, Ur: NEGATIVE

## 2020-05-02 NOTE — ED Triage Notes (Signed)
Pt had a positive preg test at home and wants a blood test here.

## 2020-05-02 NOTE — ED Provider Notes (Signed)
[facility]    CSN: [id number] Arrival date & time: 05/02/20  1425      History   Chief Complaint Chief Complaint  Patient presents with  . Possible Pregnancy    HPI Ruth Solomon is a 20 y.o. female.   HPI Patient presents today for evaluation of a home positive pregnancy test. Last normal menstrual period 04/02/2020. Has irregular bleeding a few days ago which lasted a few days with the last day of bleeding light with intermittent darkened bleeding. Normally has regular menstrual cycles that last approximately 7 days.  Denies nausea, vomiting or increased urine frequency.  Patient was last pregnant June 01, 2019. Past Medical History:  Diagnosis Date  . Thrombocytopenia (HCC)   . Thrombocytopenia [facility])     Patient Active Problem List   Diagnosis Date Noted  . Delivery of pregnancy by cesarean section 06/01/2019  . Postpartum anemia 06/01/2019  . Thrombocytopenia (HCC) 05/30/2019  . Encounter for induction of labor 05/29/2019  . Borderline FGR 04/29/2019  . Benign gestational thrombocytopenia in third trimester (HCC) 04/10/2019  . Supervision of high risk pregnancy in third trimester 04/10/2019    Past Surgical History:  Procedure Laterality Date  . CESAREAN SECTION N/A 05/30/2019   Procedure: CESAREAN SECTION;  Surgeon: Adam Phenix, MD;  Location: [facility] LD ORS;  Service: Obstetrics;  Laterality: N/A;  . FOREIGN BODY REMOVAL Left    sewing needle removed from big toe    OB History    Gravida  1   Para  1   Term  1   Preterm      AB      Living  1     SAB      IAB      Ectopic      Multiple  0   Live Births  1            Home Medications    Prior to Admission medications   Medication Sig Start Date End Date Taking? Authorizing Provider  acetaminophen (TYLENOL) 325 MG tablet Take 2 tablets (650 mg total) by mouth every 6 (six) hours as needed for mild pain (temperature > 101.5.). Patient not taking: Reported on 06/05/2019 06/01/19    Sandre Kitty, MD  amLODipine (NORVASC) 5 MG tablet Take 2 tablets (10 mg total) by mouth daily. Patient not taking: Reported on 06/26/2019 06/02/19   Katha Cabal, DO  cephALEXin (KEFLEX) 500 MG capsule Take 1 capsule (500 mg total) by mouth 2 (two) times daily. 01/17/20   Particia Nearing, PA-C  enalapril (VASOTEC) 10 MG tablet Take 1 tablet (10 mg total) by mouth daily. 06/05/19   Sparacino, Hailey L, DO  etonogestrel (NEXPLANON) 68 MG IMPL implant 1 each by Subdermal route once.    [provider]  ferrous sulfate 325 (65 FE) MG tablet Take 1 tablet (325 mg total) by mouth 2 (two) times daily with a meal. Patient not taking: Reported on 06/26/2019 06/01/19   Sharyon Cable, CNM  oxyCODONE (OXY IR/ROXICODONE) 5 MG immediate release tablet Take 1-2 tablets (5-10 mg total) by mouth every 4 (four) hours as needed for moderate pain. Patient not taking: Reported on 06/26/2019 06/01/19   Sandre Kitty, MD  Prenatal Vit-Fe Fumarate-FA (PRENATAL MULTIVITAMIN) TABS tablet Take 1 tablet by mouth daily at 12 noon.    [provider]    Family History Family History  Problem Relation Age of Onset  . Healthy Mother   . Healthy  Father     Social History Social History   Tobacco Use  . Smoking status: Never Smoker  . Smokeless tobacco: Never Used  Vaping Use  . Vaping Use: Never used  Substance Use Topics  . Alcohol use: Never  . Drug use: Not Currently    Types: Marijuana    Comment: last use a few months ago     Allergies   Patient has no known allergies.   Review of Systems Review of Systems Pertinent negatives listed in HPI  Physical Exam Triage Vital Signs ED Triage Vitals  Enc Vitals Group     BP 05/02/20 1537 126/84     Pulse Rate 05/02/20 1537 70     Resp 05/02/20 1537 16     Temp 05/02/20 1537 98.4 F (36.9 C)     Temp Source 05/02/20 1537 Oral     SpO2 05/02/20 1537 100 %     Weight --      Height --      Head Circumference --      Peak  Flow --      Pain Score 05/02/20 1539 0     Pain Loc --      Pain Edu? --      Excl. in GC? --    No data found.  Updated Vital Signs BP 126/84 (BP Location: Right Arm)   Pulse 70   Temp 98.4 F (36.9 C) (Oral)   Resp 16   LMP 04/02/2020   SpO2 100%   Breastfeeding No   Visual Acuity Right Eye Distance:   Left Eye Distance:   Bilateral Distance:    Right Eye Near:   Left Eye Near:    Bilateral Near:     Physical Exam General appearance: Alert, normal appearance, cooperative Head: Normocephalic, without obvious abnormality, atraumatic Respiratory: Respirations even and unlabored, normal respiratory rate Heart: rate and rhythm normal. No gallop or murmurs noted on exam  Abdomen: BS +, no distention, no rebound tenderness, or no mass Extremities: No gross deformities Skin: Skin color, texture, turgor normal. No rashes seen  UC Treatments / Results  Labs (all labs ordered are listed, but only abnormal results are displayed) Labs Reviewed  POCT URINALYSIS DIPSTICK, ED / UC  POC URINE PREG, ED    EKG   Radiology No results found.  Procedures Procedures (including critical care time)  Medications Ordered in UC Medications - No data to display  Initial Impression / Assessment and Plan / UC Course  I have reviewed the triage vital signs and the nursing notes.  Pertinent labs & imaging results that were available during my care of the patient were reviewed by me and considered in my medical decision making (see chart for details).     UA normal.  POC HCG negative.  Quantitative hCG pending Women's follow-up if irregularity in menstrual continues. Final Clinical Impressions(s) / UC Diagnoses   Final diagnoses:  Menstrual period late   Discharge Instructions   None    ED Prescriptions    None     PDMP not reviewed this encounter.   Bing Neighbors, FNP 05/02/20 [phone]

## 2020-05-02 NOTE — Discharge Instructions (Addendum)
Your lab work will result within 24 hours.  If your lab work is normal office will not contact you.  If the lab work is abnormal we will contact you via phone.

## 2020-05-24 ENCOUNTER — Ambulatory Visit (HOSPITAL_COMMUNITY)
Admission: EM | Admit: 2020-05-24 | Discharge: 2020-05-24 | Disposition: A | Discharge status: Home / Self Care | Payer: Worker's Compensation

## 2020-05-24 ENCOUNTER — Encounter (HOSPITAL_COMMUNITY): Payer: Self-pay | Admitting: Emergency Medicine

## 2020-05-24 ENCOUNTER — Emergency Department (HOSPITAL_COMMUNITY)
Admission: EM | Admit: 2020-05-24 | Discharge: 2020-05-25 | Disposition: A | Discharge status: Home / Self Care | Payer: Medicaid Other | Attending: Emergency Medicine | Admitting: Emergency Medicine

## 2020-05-24 ENCOUNTER — Other Ambulatory Visit: Payer: Self-pay

## 2020-05-24 ENCOUNTER — Emergency Department (HOSPITAL_COMMUNITY): Payer: Medicaid Other

## 2020-05-24 DIAGNOSIS — S6992XA Unspecified injury of left wrist, hand and finger(s), initial encounter: Secondary | ICD-10-CM | POA: Diagnosis present

## 2020-05-24 DIAGNOSIS — W268XXA Contact with other sharp object(s), not elsewhere classified, initial encounter: Secondary | ICD-10-CM | POA: Diagnosis not present

## 2020-05-24 DIAGNOSIS — S61210A Laceration without foreign body of right index finger without damage to nail, initial encounter: Secondary | ICD-10-CM | POA: Diagnosis not present

## 2020-05-24 DIAGNOSIS — Y990 Civilian activity done for income or pay: Secondary | ICD-10-CM | POA: Insufficient documentation

## 2020-05-24 MED ORDER — CEPHALEXIN 500 MG PO CAPS: 500.0000 mg | ORAL_CAPSULE | Freq: Three times a day (TID) | ORAL | 0 refills | Status: AC

## 2020-05-24 MED ORDER — LIDOCAINE HCL (PF) 1 % IJ SOLN
10.0000 mL | Freq: Once | INTRAMUSCULAR | Status: AC
  Administered 2020-05-24: 10 mL
  Filled 2020-05-24: qty 10

## 2020-05-24 MED ORDER — CEPHALEXIN 250 MG PO CAPS: 500.0000 mg | ORAL_CAPSULE | Freq: Once | ORAL | Status: DC

## 2020-05-24 NOTE — ED Provider Notes (Signed)
[facility] EMERGENCY DEPARTMENT Provider Note   CSN: [id number] Arrival date & time: 05/24/20  1701     History Chief Complaint  Patient presents with  . finger laceration    Ruth Solomon is a 20 y.o. female presented for evaluation of finger injury.  Patient states this afternoon around 3:00 she accidentally cut her right index finger on a meat slicer.  She reports acute onset pain.  She went to urgent care, but was told to come to ER as they were concerned about joint involvement.  She denies significant pain currently.  She denies numbness or tingling.  She has no medical problems, takes no medications daily.  She is not on a blood thinners.  Tetanus is up-to-date.  She has not taken anything for pain today.  HPI     Past Medical History:  Diagnosis Date  . Thrombocytopenia (HCC)   . Thrombocytopenia [facility])     Patient Active Problem List   Diagnosis Date Noted  . Delivery of pregnancy by cesarean section 06/01/2019  . Postpartum anemia 06/01/2019  . Thrombocytopenia (HCC) 05/30/2019  . Encounter for induction of labor 05/29/2019  . Borderline FGR 04/29/2019  . Benign gestational thrombocytopenia in third trimester (HCC) 04/10/2019  . Supervision of high risk pregnancy in third trimester 04/10/2019    Past Surgical History:  Procedure Laterality Date  . CESAREAN SECTION N/A 05/30/2019   Procedure: CESAREAN SECTION;  Surgeon: Adam Phenix, MD;  Location: [facility] LD ORS;  Service: Obstetrics;  Laterality: N/A;  . FOREIGN BODY REMOVAL Left    sewing needle removed from big toe     OB History    Gravida  1   Para  1   Term  1   Preterm      AB      Living  1     SAB      IAB      Ectopic      Multiple  0   Live Births  1           Family History  Problem Relation Age of Onset  . Healthy Mother   . Healthy Father     Social History   Tobacco Use  . Smoking status: Never Smoker  . Smokeless tobacco: Never Used   Vaping Use  . Vaping Use: Never used  Substance Use Topics  . Alcohol use: Never  . Drug use: Not Currently    Types: Marijuana    Comment: last use a few months ago    Home Medications Prior to Admission medications   Medication Sig Start Date End Date Taking? Authorizing Provider  acetaminophen (TYLENOL) 325 MG tablet Take 2 tablets (650 mg total) by mouth every 6 (six) hours as needed for mild pain (temperature > 101.5.). Patient not taking: Reported on 06/05/2019 06/01/19   Sandre Kitty, MD  amLODipine (NORVASC) 5 MG tablet Take 2 tablets (10 mg total) by mouth daily. Patient not taking: Reported on 06/26/2019 06/02/19   Katha Cabal, DO  cephALEXin (KEFLEX) 500 MG capsule Take 1 capsule (500 mg total) by mouth 3 (three) times daily for 5 days. 05/24/20 05/29/20  Caccavale, Sophia, PA-C  enalapril (VASOTEC) 10 MG tablet Take 1 tablet (10 mg total) by mouth daily. 06/05/19   Sparacino, Hailey L, DO  etonogestrel (NEXPLANON) 68 MG IMPL implant 1 each by Subdermal route once.    [provider]  ferrous sulfate 325 (65 FE) MG tablet Take 1  tablet (325 mg total) by mouth 2 (two) times daily with a meal. Patient not taking: Reported on 06/26/2019 06/01/19   Sharyon Cable, CNM  oxyCODONE (OXY IR/ROXICODONE) 5 MG immediate release tablet Take 1-2 tablets (5-10 mg total) by mouth every 4 (four) hours as needed for moderate pain. Patient not taking: Reported on 06/26/2019 06/01/19   Sandre Kitty, MD  Prenatal Vit-Fe Fumarate-FA (PRENATAL MULTIVITAMIN) TABS tablet Take 1 tablet by mouth daily at 12 noon.    [provider]    Allergies    Patient has no known allergies.  Review of Systems   Review of Systems  Skin: Positive for wound.  Hematological: Does not bruise/bleed easily.    Physical Exam Updated Vital Signs BP 108/62 (BP Location: Left Arm)   Pulse 63   Temp 98.3 F (36.8 C) (Oral)   Resp 16   Ht 5\' 2"  (1.575 m)   Wt 72.6 kg   LMP 05/24/2020   SpO2 99%    BMI 29.26 kg/m   Physical Exam Vitals and nursing note reviewed.  Constitutional:      General: She is not in acute distress.    Appearance: She is well-developed.     Comments: Resting in the bed in NAD  HENT:     Head: Normocephalic and atraumatic.  Pulmonary:     Effort: Pulmonary effort is normal.  Abdominal:     General: There is no distension.  Musculoskeletal:        General: Normal range of motion.     Cervical back: Normal range of motion.     Comments: Laceration to the dorsal right index finger over the PIP.  No active bleeding.  Laceration approximately 2 cm long, approximately 0.5 cm deep.  Good distal sensation and cap refill.  Full active range of motion of each joint when held in isolation.  No injury noted elsewhere.  Skin:    General: Skin is warm.     Capillary Refill: Capillary refill takes less than 2 seconds.     Findings: No rash.  Neurological:     Mental Status: She is alert and oriented to person, place, and time.     ED Results / Procedures / Treatments   Labs (all labs ordered are listed, but only abnormal results are displayed) Labs Reviewed - No data to display  EKG None  Radiology DG Finger Index Right  Result Date: 05/24/2020 CLINICAL DATA:  Right index finger laceration EXAM: RIGHT INDEX FINGER 2+V COMPARISON:  None. FINDINGS: Soft tissue irregularity along the radial aspect of the right index finger at the level of the proximal interphalangeal joint compatible with reported laceration. No underlying fracture. No malalignment. No arthropathy. No radiopaque foreign body. IMPRESSION: Soft tissue laceration without underlying fracture or radiopaque foreign body. Electronically Signed   By: 05/26/2020 D.O.   On: 05/24/2020 19:05    Procedures .03/29/2022Laceration Repair  Date/Time: 05/24/2020 10:35 PM Performed by: 05/26/2020, PA-C Authorized by: Alveria Apley, PA-C   Consent:    Consent obtained:  Verbal   Consent given by:   Patient   Risks discussed:  Infection, need for additional repair, nerve damage, poor wound healing, poor cosmetic result and pain Anesthesia:    Anesthesia method:  Nerve block   Block location:  R index finger   Block needle gauge:  25 G   Block anesthetic:  Lidocaine 1% w/o epi   Block technique:  Tendon sheath   Block injection procedure:  Anatomic landmarks identified, anatomic landmarks palpated, introduced needle, negative aspiration for blood and incremental injection   Block outcome:  Anesthesia achieved Laceration details:    Location:  Finger   Finger location:  R index finger   Length (cm):  2   Depth (mm):  5 Pre-procedure details:    Preparation:  Patient was prepped and draped in usual sterile fashion and imaging obtained to evaluate for foreign bodies Exploration:    Wound exploration: wound explored through full range of motion and entire depth of wound visualized     Wound extent: no nerve damage noted, no tendon damage noted, no underlying fracture noted and no vascular damage noted   Treatment:    Area cleansed with:  Saline and povidone-iodine   Amount of cleaning:  Extensive   Irrigation solution:  Sterile water   Irrigation method:  Syringe Skin repair:    Repair method:  Sutures   Suture size:  4-0 and 3-0   Suture material:  Prolene   Suture technique:  Simple interrupted   Number of sutures:  5 Approximation:    Approximation:  Close Repair type:    Repair type:  Simple Post-procedure details:    Dressing:  Sterile dressing and splint for protection   Procedure completion:  Tolerated well, no immediate complications     Medications Ordered in ED Medications  cephALEXin (KEFLEX) capsule 500 mg (has no administration in time range)  lidocaine (PF) (XYLOCAINE) 1 % injection 10 mL (10 mLs Infiltration Given 05/24/20 2223)    ED Course  I have reviewed the triage vital signs and the nursing notes.  Pertinent labs & imaging results that were  available during my care of the patient were reviewed by me and considered in my medical decision making (see chart for details).    MDM Rules/Calculators/A&P                          Patient presenting for evaluation of finger laceration.  On exam, patient is nontoxic.  She is neurovascularly intact.  Tetanus is already up-to-date.  X-ray obtained from triage read interpreted by me, no fracture or dislocation.    Laceration repaired as described above.  Discussed aftercare instructions.  As laceration is over joint, patient discharged on antibiotics.  Static finger splint given for joint protection.  At this time, patient appears safe for discharge.  Return precautions given.  Patient states she understands and agrees to plan.  Final Clinical Impression(s) / ED Diagnoses Final diagnoses:  Laceration of right index finger without foreign body without damage to nail, initial encounter    Rx / DC Orders ED Discharge Orders         Ordered    cephALEXin (KEFLEX) 500 MG capsule  3 times daily        05/24/20 2308           Alveria Apley, PA-C 05/24/20 2312    Terald Sleeper, MD 05/25/20 1030

## 2020-05-24 NOTE — ED Notes (Signed)
Ortho tech paged at this time

## 2020-05-24 NOTE — ED Triage Notes (Signed)
Pt reports laceration to R index finger on a meat slicer at work today.  States she was seen at [facility] and sent to ED.  Bandage in place on arrival.

## 2020-05-24 NOTE — Discharge Instructions (Signed)
1. Medications: Tylenol or ibuprofen for pain, antibiotics to prevent infection 2. Treatment: ice for swelling, keep wound clean with warm soap and water and keep bandage dry 3. Follow Up: Please return in 14 days to have your stitches removed or sooner if you have concerns. This may be done on the ER, at urgent care, or with your primary care. Return to the emergency department for increased redness, drainage of pus from the wound   WOUND CARE  Remove bandage and wash wound gently with mild soap and warm water daily. Reapply splint after cleaning   Continue daily cleansing with soap and water until stitches are removed.  Do not apply any ointments or creams to the wound while stitches are in place, as this may cause delayed healing. Return if you experience any of the following signs of infection: Swelling, redness, pus drainage, streaking, fever >101.0 F  Return if you experience excessive bleeding that does not stop after 15-20 minutes of constant, firm pressure.

## 2020-05-24 NOTE — ED Notes (Signed)
Provider at bedside

## 2020-05-24 NOTE — ED Triage Notes (Signed)
Pt presents today for laceration to right 2nd finger. She reports cutting it on meat slicer today while at work. Bleeding controlled.

## 2020-05-25 NOTE — Progress Notes (Signed)
Orthopedic Tech Progress Note Patient Details:  Ruth Solomon 2000-04-01 [id number]  Ortho Devices Type of Ortho Device: Finger splint Ortho Device/Splint Location: rue index Ortho Device/Splint Interventions: Ordered,Application,Adjustment   Post Interventions Patient Tolerated: Well Instructions Provided: Care of device,Adjustment of device   [name] Post 05/25/2020, 12:23 AM

## 2020-06-09 ENCOUNTER — Encounter (HOSPITAL_COMMUNITY): Payer: Self-pay | Admitting: Emergency Medicine

## 2020-06-09 ENCOUNTER — Ambulatory Visit (HOSPITAL_COMMUNITY)
Admission: EM | Admit: 2020-06-09 | Discharge: 2020-06-09 | Disposition: A | Discharge status: Home / Self Care | Payer: Medicaid Other

## 2020-06-09 ENCOUNTER — Other Ambulatory Visit: Payer: Self-pay

## 2020-06-09 DIAGNOSIS — Z4802 Encounter for removal of sutures: Secondary | ICD-10-CM | POA: Diagnosis not present

## 2020-06-09 DIAGNOSIS — K5900 Constipation, unspecified: Secondary | ICD-10-CM

## 2020-06-09 NOTE — Discharge Instructions (Signed)
Increase the amount of fiber that you are currently eating.  Make sure that you are drinking at least 8 glasses of water a day.  Take MiraLAX once a day until you have a full bowel movement.   If you develop severe abdominal pain or vomiting please go to the emergency room for further evaluation.  Get established and follow-up with your primary care provider for long-term management of constipation.

## 2020-06-09 NOTE — ED Triage Notes (Signed)
Patient was seen 3/27 in ED.  Patient is here today for removal of stitches.  Patient also has concerns for constipation.  History of the same.  Last BM was one week ago

## 2020-06-09 NOTE — ED Provider Notes (Signed)
[facility]    CSN: [id number] Arrival date & time: 06/09/20  1136      History   Chief Complaint Chief Complaint  Patient presents with  . Constipation  . Suture / Staple Removal    HPI Ruth Solomon is a 20 y.o. female.   Patient is here for suture removal and constipation.  Reports constipation has been ongoing for the past week.  Reports having similar issues in the past that have normally resolved on their own.  Reports last bowel movement was approximately a week ago.  Has not tried any OTC medications or treatments. Denies any specific alleviating or aggravating factors.  Denies any fevers, chest pain, shortness of breath, N/V/D, numbness, tingling, weakness, abdominal pain, or headaches.   ROS: As per HPI, all other pertinent ROS negative   The history is provided by the patient.  Constipation Associated symptoms: no abdominal pain, no nausea and no vomiting   Suture / Staple Removal Pertinent negatives include no abdominal pain.    Past Medical History:  Diagnosis Date  . Thrombocytopenia (HCC)   . Thrombocytopenia [facility])     Patient Active Problem List   Diagnosis Date Noted  . Delivery of pregnancy by cesarean section 06/01/2019  . Postpartum anemia 06/01/2019  . Thrombocytopenia (HCC) 05/30/2019  . Encounter for induction of labor 05/29/2019  . Borderline FGR 04/29/2019  . Benign gestational thrombocytopenia in third trimester (HCC) 04/10/2019  . Supervision of high risk pregnancy in third trimester 04/10/2019    Past Surgical History:  Procedure Laterality Date  . CESAREAN SECTION N/A 05/30/2019   Procedure: CESAREAN SECTION;  Surgeon: Adam Phenix, MD;  [facility]: [facility];  Service: Obstetrics;  Laterality: N/A;  . FOREIGN BODY REMOVAL Left    sewing needle removed from big toe    OB History    Gravida  1   Para  1   Term  1   Preterm      AB      Living  1     SAB      IAB      Ectopic      Multiple  0   Live  Births  1            Home Medications    Prior to Admission medications   Medication Sig Start Date End Date Taking? Authorizing Provider  acetaminophen (TYLENOL) 325 MG tablet Take 2 tablets (650 mg total) by mouth every 6 (six) hours as needed for mild pain (temperature > 101.5.). Patient not taking: Reported on 06/05/2019 06/01/19   Sandre Kitty, MD  amLODipine (NORVASC) 5 MG tablet Take 2 tablets (10 mg total) by mouth daily. Patient not taking: Reported on 06/26/2019 06/02/19   Katha Cabal, DO  enalapril (VASOTEC) 10 MG tablet Take 1 tablet (10 mg total) by mouth daily. 06/05/19   Sparacino, Hailey L, DO  etonogestrel (NEXPLANON) 68 MG IMPL implant 1 each by Subdermal route once.    [provider]  ferrous sulfate 325 (65 FE) MG tablet Take 1 tablet (325 mg total) by mouth 2 (two) times daily with a meal. Patient not taking: Reported on 06/26/2019 06/01/19   Sharyon Cable, CNM  oxyCODONE (OXY IR/ROXICODONE) 5 MG immediate release tablet Take 1-2 tablets (5-10 mg total) by mouth every 4 (four) hours as needed for moderate pain. Patient not taking: Reported on 06/26/2019 06/01/19   Sandre Kitty, MD  Prenatal Vit-Fe Fumarate-FA (PRENATAL MULTIVITAMIN) TABS  tablet Take 1 tablet by mouth daily at 12 noon.    [provider]    Family History Family History  Problem Relation Age of Onset  . Healthy Mother   . Healthy Father     Social History Social History   Tobacco Use  . Smoking status: Never Smoker  . Smokeless tobacco: Never Used  Vaping Use  . Vaping Use: Never used  Substance Use Topics  . Alcohol use: Never  . Drug use: Not Currently    Types: Marijuana    Comment: last use a few months ago     Allergies   Patient has no known allergies.   Review of Systems Review of Systems  Gastrointestinal: Positive for constipation. Negative for abdominal distention, abdominal pain, nausea and vomiting.  All other systems reviewed and are  negative.    Physical Exam Triage Vital Signs ED Triage Vitals [06/09/20 1252]  Enc Vitals Group     BP      Pulse      Resp      Temp      Temp src      SpO2      Weight      Height      Head Circumference      Peak Flow      Pain Score 0     Pain Loc      Pain Edu?      Excl. in GC?    No data found.  Updated Vital Signs LMP 05/24/2020   Visual Acuity Right Eye Distance:   Left Eye Distance:   Bilateral Distance:    Right Eye Near:   Left Eye Near:    Bilateral Near:     Physical Exam Vitals and nursing note reviewed.  Constitutional:      General: She is not in acute distress.    Appearance: Normal appearance. She is not ill-appearing, toxic-appearing or diaphoretic.  HENT:     Head: Normocephalic and atraumatic.  Eyes:     Conjunctiva/sclera: Conjunctivae normal.  Cardiovascular:     Rate and Rhythm: Normal rate.     Pulses: Normal pulses.  Pulmonary:     Effort: Pulmonary effort is normal.  Abdominal:     General: Abdomen is flat. Bowel sounds are normal. There is no distension.     Palpations: Abdomen is soft.     Tenderness: There is no abdominal tenderness. There is no right CVA tenderness, left CVA tenderness, guarding or rebound. Negative signs include Murphy's sign, Rovsing's sign, McBurney's sign, psoas sign and obturator sign.     Hernia: No hernia is present.  Musculoskeletal:        General: Normal range of motion.     Cervical back: Normal range of motion.  Skin:    General: Skin is warm and dry.  Neurological:     General: No focal deficit present.     Mental Status: She is alert and oriented to person, place, and time.  Psychiatric:        Mood and Affect: Mood normal.      UC Treatments / Results  Labs (all labs ordered are listed, but only abnormal results are displayed) Labs Reviewed - No data to display  EKG   Radiology No results found.  Procedures Procedures (including critical care time)  Medications Ordered  in UC Medications - No data to display  Initial Impression / Assessment and Plan / UC Course  I have reviewed the  triage vital signs and the nursing notes.  Pertinent labs & imaging results that were available during my care of the patient were reviewed by me and considered in my medical decision making (see chart for details).     Assessment negative for red flags or concerns including small bowel obstruction.  Patient encouraged to increase the amount of fiber in her diet and ensuring adequate fluid intake especially water. Recommend MiraLAX once a day until patient has bowel movement. Strict ED follow-up for development of severe abdominal pain or vomiting. PCP referral placed so patient may get established for long-term management of chronic constipation.   Final Clinical Impressions(s) / UC Diagnoses   Final diagnoses:  Constipation, unspecified constipation type  Encounter for removal of sutures     Discharge Instructions     Increase the amount of fiber that you are currently eating.  Make sure that you are drinking at least 8 glasses of water a day.  Take MiraLAX once a day until you have a full bowel movement.   If you develop severe abdominal pain or vomiting please go to the emergency room for further evaluation.  Get established and follow-up with your primary care provider for long-term management of constipation.      ED Prescriptions    None     PDMP not reviewed this encounter.   Ivette Loyal, NP 06/09/20 1356

## 2020-06-16 ENCOUNTER — Ambulatory Visit (HOSPITAL_BASED_OUTPATIENT_CLINIC_OR_DEPARTMENT_OTHER): Payer: Medicaid Other | Admitting: Family Medicine

## 2020-06-20 ENCOUNTER — Emergency Department (HOSPITAL_COMMUNITY)
Admission: EM | Admit: 2020-06-20 | Discharge: 2020-06-20 | Disposition: A | Discharge status: Home / Self Care | Payer: Medicaid Other | Attending: Emergency Medicine | Admitting: Emergency Medicine

## 2020-06-20 ENCOUNTER — Other Ambulatory Visit: Payer: Self-pay

## 2020-06-20 ENCOUNTER — Emergency Department (HOSPITAL_COMMUNITY): Payer: Medicaid Other

## 2020-06-20 ENCOUNTER — Encounter (HOSPITAL_COMMUNITY): Payer: Self-pay | Admitting: Emergency Medicine

## 2020-06-20 DIAGNOSIS — K5900 Constipation, unspecified: Secondary | ICD-10-CM

## 2020-06-20 LAB — I-STAT BETA HCG BLOOD, ED (MC, WL, AP ONLY): I-stat hCG, quantitative: <5 m[IU]/mL (ref <5)

## 2020-06-20 MED ORDER — DOCUSATE SODIUM 100 MG PO CAPS
100.0000 mg | ORAL_CAPSULE | Freq: Once | ORAL | Status: AC
  Administered 2020-06-20: 100 mg via ORAL
  Filled 2020-06-20: qty 1

## 2020-06-20 MED ORDER — SENNA 8.6 MG PO TABS
1.0000 | ORAL_TABLET | ORAL | Status: AC
  Administered 2020-06-20: 8.6 mg via ORAL
  Filled 2020-06-20: qty 1

## 2020-06-20 MED ORDER — POLYETHYLENE GLYCOL 3350 17 G PO PACK
17.0000 g | PACK | Freq: Every day | ORAL | Status: DC
  Administered 2020-06-20: 17 g via ORAL
  Filled 2020-06-20: qty 1

## 2020-06-20 NOTE — Discharge Instructions (Addendum)
Colace twice daily.  Miralax one capful 3 times daily and then glycerin suppository as needed.  Increase fiber in your diet

## 2020-06-20 NOTE — ED Notes (Signed)
Patient verbalizes understanding of discharge instructions. Opportunity for questioning and answers were provided. Armband removed by staff, pt discharged from ED ambulatory.   

## 2020-06-20 NOTE — ED Provider Notes (Signed)
[facility] EMERGENCY DEPARTMENT Provider Note   CSN: [id number] Arrival date & time: 06/20/20  0009     History Chief Complaint  Patient presents with  . Constipation    Ruth Solomon is a 20 y.o. female.  The history is provided by the patient.  Constipation Severity:  Moderate Time since last bowel movement:  1 week Timing:  Constant Progression:  Unchanged Chronicity:  Recurrent Context: not narcotics   Context comment:  Fried foods poor diet  Stool description:  None produced Relieved by:  Nothing Worsened by:  Nothing Ineffective treatments:  Miralax (once daily ) Associated symptoms: no abdominal pain, no anorexia, no fever, no nausea, no urinary retention and no vomiting   Risk factors: no change in medication        Past Medical History:  Diagnosis Date  . Thrombocytopenia (HCC)   . Thrombocytopenia [facility])     Patient Active Problem List   Diagnosis Date Noted  . Delivery of pregnancy by cesarean section 06/01/2019  . Postpartum anemia 06/01/2019  . Thrombocytopenia (HCC) 05/30/2019  . Encounter for induction of labor 05/29/2019  . Borderline FGR 04/29/2019  . Benign gestational thrombocytopenia in third trimester (HCC) 04/10/2019  . Supervision of high risk pregnancy in third trimester 04/10/2019    Past Surgical History:  Procedure Laterality Date  . CESAREAN SECTION N/A 05/30/2019   Procedure: CESAREAN SECTION;  Surgeon: Adam Phenix, MD;  Location: [facility] LD ORS;  Service: Obstetrics;  Laterality: N/A;  . FOREIGN BODY REMOVAL Left    sewing needle removed from big toe     OB History    Gravida  1   Para  1   Term  1   Preterm      AB      Living  1     SAB      IAB      Ectopic      Multiple  0   Live Births  1           Family History  Problem Relation Age of Onset  . Healthy Mother   . Healthy Father     Social History   Tobacco Use  . Smoking status: Never Smoker  . Smokeless tobacco:  Never Used  Vaping Use  . Vaping Use: Never used  Substance Use Topics  . Alcohol use: Never  . Drug use: Not Currently    Types: Marijuana    Comment: last use a few months ago    Home Medications Prior to Admission medications   Medication Sig Start Date End Date Taking? Authorizing Provider  acetaminophen (TYLENOL) 325 MG tablet Take 2 tablets (650 mg total) by mouth every 6 (six) hours as needed for mild pain (temperature > 101.5.). Patient not taking: Reported on 06/05/2019 06/01/19   Sandre Kitty, MD  amLODipine (NORVASC) 5 MG tablet Take 2 tablets (10 mg total) by mouth daily. Patient not taking: Reported on 06/26/2019 06/02/19   Katha Cabal, DO  enalapril (VASOTEC) 10 MG tablet Take 1 tablet (10 mg total) by mouth daily. 06/05/19   Sparacino, Hailey L, DO  etonogestrel (NEXPLANON) 68 MG IMPL implant 1 each by Subdermal route once.    [provider]  ferrous sulfate 325 (65 FE) MG tablet Take 1 tablet (325 mg total) by mouth 2 (two) times daily with a meal. Patient not taking: Reported on 06/26/2019 06/01/19   Sharyon Cable, CNM  oxyCODONE (OXY IR/ROXICODONE) 5 MG  immediate release tablet Take 1-2 tablets (5-10 mg total) by mouth every 4 (four) hours as needed for moderate pain. Patient not taking: Reported on 06/26/2019 06/01/19   Sandre Kitty, MD  Prenatal Vit-Fe Fumarate-FA (PRENATAL MULTIVITAMIN) TABS tablet Take 1 tablet by mouth daily at 12 noon.    [provider]    Allergies    Patient has no known allergies.  Review of Systems   Review of Systems  Constitutional: Negative for fever.  HENT: Negative for congestion.   Eyes: Negative for visual disturbance.  Respiratory: Negative for shortness of breath.   Cardiovascular: Negative for chest pain.  Gastrointestinal: Positive for constipation. Negative for abdominal pain, anorexia, nausea and vomiting.  Genitourinary: Negative for difficulty urinating.  Musculoskeletal: Negative for arthralgias.   Skin: Negative for wound.  Neurological: Negative for dizziness.  Psychiatric/Behavioral: Negative for agitation.  All other systems reviewed and are negative.   Physical Exam Updated Vital Signs BP 114/70 (BP Location: Right Arm)   Pulse 86   Temp 98.8 F (37.1 C) (Oral)   Resp 16   Ht 5\' 2"  (1.575 m)   Wt 72.6 kg   LMP 05/24/2020   SpO2 100%   BMI 29.26 kg/m   Physical Exam Vitals and nursing note reviewed.  Constitutional:      General: She is not in acute distress.    Appearance: Normal appearance.  HENT:     Head: Normocephalic and atraumatic.     Nose: Nose normal.  Eyes:     Conjunctiva/sclera: Conjunctivae normal.     Pupils: Pupils are equal, round, and reactive to light.  Cardiovascular:     Rate and Rhythm: Normal rate and regular rhythm.     Pulses: Normal pulses.     Heart sounds: Normal heart sounds.  Pulmonary:     Effort: Pulmonary effort is normal.     Breath sounds: Normal breath sounds.  Abdominal:     General: Abdomen is flat. Bowel sounds are normal.     Palpations: Abdomen is soft.     Tenderness: There is no abdominal tenderness. There is no guarding.  Musculoskeletal:        General: Normal range of motion.     Cervical back: Normal range of motion and neck supple.  Skin:    General: Skin is warm and dry.     Capillary Refill: Capillary refill takes less than 2 seconds.  Neurological:     General: No focal deficit present.     Mental Status: She is alert and oriented to person, place, and time.  Psychiatric:        Mood and Affect: Mood normal.        Behavior: Behavior normal.     ED Results / Procedures / Treatments   Labs (all labs ordered are listed, but only abnormal results are displayed) Labs Reviewed  I-STAT BETA HCG BLOOD, ED ([facility], [facility], [facility] ONLY)    EKG None  Radiology DG Abdomen 1 View  Result Date: 06/20/2020 CLINICAL DATA:  Constipation for 1 week. EXAM: ABDOMEN - 1 VIEW COMPARISON:  07/20/2011 FINDINGS: Gas and  stool throughout the colon with prominent rectal stool. Changes likely constipation. No small bowel distention. No radiopaque stones. Visualized bones appear intact. IMPRESSION: Stool-filled colon with prominent rectal stool suggesting constipation. No evidence of small bowel obstruction. Electronically Signed   By: 07/22/2011 M.D.   On: 06/20/2020 02:15    Procedures Procedures   Medications Ordered in ED Medications  polyethylene glycol (MIRALAX / GLYCOLAX) packet 17 g (17 g Oral Given 06/20/20 0520)  docusate sodium (COLACE) capsule 100 mg (100 mg Oral Given 06/20/20 0521)  senna (SENOKOT) tablet 8.6 mg (8.6 mg Oral Given 06/20/20 2924)    ED Course  I have reviewed the triage vital signs and the nursing notes.  Pertinent labs & imaging results that were available during my care of the patient were reviewed by me and considered in my medical decision making (see chart for details).    recommend miralax TID x 7 days, increase fiber in your diet.  Stay away from fried foods.  Suppository rectal prn.  Will also start colace.  Exam is benign and reassuring.  Stable for discharge with close follow up   Chelsea Pedretti was evaluated in Emergency Department on 06/20/2020 for the symptoms described in the history of present illness. She was evaluated in the context of the global COVID-19 pandemic, which necessitated consideration that the patient might be at risk for infection with the SARS-CoV-2 virus that causes COVID-19. Institutional protocols and algorithms that pertain to the evaluation of patients at risk for COVID-19 are in a state of rapid change based on information released by regulatory bodies including the CDC and federal and state organizations. These policies and algorithms were followed during the patient's care in the ED.  Final Clinical Impression(s) / ED Diagnoses Return for intractable cough, coughing up blood, fevers >100.4 unrelieved by medication, shortness of breath,  intractable vomiting, chest pain, shortness of breath, weakness, numbness, changes in speech, facial asymmetry, abdominal pain, passing out, Inability to tolerate liquids or food, cough, altered mental status or any concerns. No signs of systemic illness or infection. The patient is nontoxic-appearing on exam and vital signs are within normal limits.  I have reviewed the triage vital signs and the nursing notes. Pertinent labs & imaging results that were available during my care of the patient were reviewed by me and considered in my medical decision making (see chart for details). After history, exam, and medical workup I feel the patient has been appropriately medically screened and is safe for discharge home. Pertinent diagnoses were discussed with the patient. Patient was given return precautions.   Jimma Ortman, MD 06/20/20 [id number]

## 2020-06-20 NOTE — ED Triage Notes (Signed)
Pt arrives to ED POV for Constipation. Hx of same Last BM 1 week ago. Pt also states having "Leaky light brown stools" since yesterday.

## 2020-06-20 NOTE — ED Provider Notes (Signed)
MSE was initiated and I personally evaluated the patient and placed orders (if any) at  12:54 AM on June 20, 2020.  Patient with history of constipation to ED for evaluation of constipation, last BM one week ago, now having abdominal cramping. Using once daily Miralax without relief. No nausea, vomiting. No urinary symptoms. No previous abdominal surgeries.   Abdomen soft BS decreased but active  The patient appears stable so that the remainder of the MSE may be completed by another provider.   Elpidio Anis, PA-C 06/20/20 0054    Palumbo, April, MD 06/20/20 [id number]

## 2020-06-22 ENCOUNTER — Encounter (HOSPITAL_BASED_OUTPATIENT_CLINIC_OR_DEPARTMENT_OTHER): Payer: Self-pay | Admitting: Family Medicine

## 2020-06-29 ENCOUNTER — Ambulatory Visit (INDEPENDENT_AMBULATORY_CARE_PROVIDER_SITE_OTHER): Payer: Medicaid Other | Admitting: Nurse Practitioner

## 2020-06-29 ENCOUNTER — Encounter (HOSPITAL_BASED_OUTPATIENT_CLINIC_OR_DEPARTMENT_OTHER): Payer: Self-pay | Admitting: Nurse Practitioner

## 2020-06-29 ENCOUNTER — Other Ambulatory Visit: Payer: Self-pay

## 2020-06-29 ENCOUNTER — Other Ambulatory Visit (HOSPITAL_BASED_OUTPATIENT_CLINIC_OR_DEPARTMENT_OTHER)
Admission: RE | Admit: 2020-06-29 | Discharge: 2020-06-29 | Disposition: A | Discharge status: Home / Self Care | Payer: Medicaid Other | Source: Ambulatory Visit | Attending: Nurse Practitioner | Admitting: Nurse Practitioner

## 2020-06-29 VITALS — BP 106/64 | HR 71 | Ht 62.0 in | Wt 142.4 lb

## 2020-06-29 DIAGNOSIS — K5900 Constipation, unspecified: Secondary | ICD-10-CM | POA: Insufficient documentation

## 2020-06-29 DIAGNOSIS — D696 Thrombocytopenia, unspecified: Secondary | ICD-10-CM

## 2020-06-29 DIAGNOSIS — Z0000 Encounter for general adult medical examination without abnormal findings: Secondary | ICD-10-CM | POA: Insufficient documentation

## 2020-06-29 DIAGNOSIS — N926 Irregular menstruation, unspecified: Secondary | ICD-10-CM

## 2020-06-29 DIAGNOSIS — Z7689 Persons encountering health services in other specified circumstances: Secondary | ICD-10-CM | POA: Insufficient documentation

## 2020-06-29 HISTORY — DX: Encounter for general adult medical examination without abnormal findings: Z00.00

## 2020-06-29 LAB — CBC WITH DIFFERENTIAL/PLATELET
Abs Immature Granulocytes: 0 10*3/uL (ref 0.00–0.07)
Basophils Absolute: 0 10*3/uL (ref 0.0–0.1)
Basophils Relative: 1 %
Eosinophils Absolute: 0.1 10*3/uL (ref 0.0–0.5)
Eosinophils Relative: 2 %
HCT: 41.2 % (ref 36.0–46.0)
Hemoglobin: 13.2 g/dL (ref 12.0–15.0)
Immature Granulocytes: 0 %
Lymphocytes Relative: 34 %
Lymphs Abs: 1.4 10*3/uL (ref 0.7–4.0)
MCH: 26.8 pg (ref 26.0–34.0)
MCHC: 32 g/dL (ref 30.0–36.0)
MCV: 83.7 fL (ref 80.0–100.0)
Monocytes Absolute: 0.2 10*3/uL (ref 0.1–1.0)
Monocytes Relative: 6 %
Neutro Abs: 2.4 10*3/uL (ref 1.7–7.7)
Neutrophils Relative %: 57 %
Platelets: 98 10*3/uL — ABNORMAL LOW (ref 150–400)
RBC: 4.92 MIL/uL (ref 3.87–5.11)
RDW: 13.2 % (ref 11.5–15.5)
WBC: 4.1 10*3/uL (ref 4.0–10.5)
nRBC: 0 % (ref 0.0–0.2)

## 2020-06-29 LAB — COMPREHENSIVE METABOLIC PANEL
ALT: 6 U/L (ref 0–44)
AST: 12 U/L — ABNORMAL LOW (ref 15–41)
Albumin: 4.2 g/dL (ref 3.5–5.0)
Alkaline Phosphatase: 55 U/L (ref 38–126)
Anion gap: 6 (ref 5–15)
BUN: 6 mg/dL (ref 6–20)
CO2: 28 mmol/L (ref 22–32)
Calcium: 9.7 mg/dL (ref 8.9–10.3)
Chloride: 107 mmol/L (ref 98–111)
Creatinine, Ser: 0.69 mg/dL (ref 0.44–1.00)
GFR, Estimated: >60 mL/min (ref >60)
Glucose, Bld: 81 mg/dL (ref 70–99)
Potassium: 4 mmol/L (ref 3.5–5.1)
Sodium: 141 mmol/L (ref 135–145)
Total Bilirubin: 0.5 mg/dL (ref 0.3–1.2)
Total Protein: 6.4 g/dL — ABNORMAL LOW (ref 6.5–8.1)

## 2020-06-29 LAB — LIPID PANEL
Cholesterol: 184 mg/dL (ref 0–200)
HDL: 57 mg/dL (ref >40)
LDL Cholesterol: 120 mg/dL — ABNORMAL HIGH (ref 0–99)
Total CHOL/HDL Ratio: 3.2 RATIO
Triglycerides: 36 mg/dL (ref <150)
VLDL: 7 mg/dL (ref 0–40)

## 2020-06-29 MED ORDER — COMPLETENATE 29-1 MG PO CHEW: 1.0000 | CHEWABLE_TABLET | Freq: Every day | ORAL | 11 refills | Status: DC

## 2020-06-29 MED ORDER — DOCUSATE SODIUM 100 MG PO CAPS: 100.0000 mg | ORAL_CAPSULE | Freq: Two times a day (BID) | ORAL | 11 refills | Status: DC

## 2020-06-29 MED ORDER — POLYETHYLENE GLYCOL 3350 17 G PO PACK: PACK | ORAL | 11 refills | Status: DC

## 2020-06-29 NOTE — Assessment & Plan Note (Addendum)
CBC, CMP, TSH, and Lipids ordered today.  TSH monitoring for constipation and menstrual cycle irregularities.  Physical exam today with no abnormal findings other than abdominal distention due to constipation- no alarm signs present.

## 2020-06-29 NOTE — Progress Notes (Signed)
New Patient Office Visit  Subjective:  Patient ID: Ruth Solomon, female    DOB: [birth year]  Age: 20 y.o. MRN: [id number]  CC:  Chief Complaint  Patient presents with  . Establish Care  . Constipation    HPI Ruth Solomon is a 20 year old female presenting today to establish care.   She has received care in the past with OBGYN and Oncology through [facility] for pregnancy related care and benign gestational thrombocytopenia in pregnacy. Other care has been received through [facility] for the past several years.   Her last routine labs were in April 2021. She has not had a routine physical exam in the last year. She tells me her last pap smear was in November 2021 with the [facility].   Today she has concerns for constipation. This has been on and off for years with historical resolution without intervention. However, in past several weeks symptoms have been worse and not resolving. She did seek care at Urgent Care and was started on Miralax BID. This caused diarrhea and leaking of stool, therefore she stopped taking this. She was given senna and docusate in UC, but she reports she developed abdominal pain. At this time her last BM was about a week ago. She has had no blood in stools, nausea, or vomiting. She does endorse abdominal pain with no bowel movement in several days. She has been trying to avoid straining to prevent hemorrhoids. She reports that her appetite has been decreased with worsening constipation.   She reports that she does not have a well balanced diet with typical foods being fast food or fried. She endorses not drinking enough water, but does report that she drinks several glasses of cranberry juice per day. She does not get regular physical activity.  She works in the [employer] at [employer]. She has one son, a year old. She is in a committed relationship and endorses feeling safe without concerns for abuse.  She would  like to have more children in the near future. She does have some concerns with her periods not being completely regular since giving birth, but she is having a period every month. Reports some of her periods are longer and some are shorter. She is not on any hormonal contraceptive. Her LMP was 4/23. She denies concerns for STI and declines testing today.  She does not use nicotine products, recreational drugs, or alcohol.  She endorses her last tetanus shot was in 01/2018.  Past Medical History:  Diagnosis Date  . Borderline FGR 04/29/2019   [ ]  serial growth u/s  2/26: efw 12%, AC 18% normal UA  . Encounter for induction of labor 05/29/2019  . Supervision of high risk pregnancy in third trimester 04/10/2019    Nursing Staff Provider Office Location  [place]   Language   English Anatomy 06/08/2019   Flu Vaccine   declined 04/22/19 Genetic Screen  NIPS:   AFP:   First Screen:  Quad:   TDaP vaccine   03/25/19 Hgb A1C or  GTT [facility] Knoop  Third trimester  Rhogam   NA   LAB RESULTS  Feeding Plan  Breast Blood Type O/Positive/-- (10/08 0000)  Contraception  Nexplanon Antibody Negative (10/08 0000) Circumcision  Inpatien  . Thrombocytopenia (HCC)   . Thrombocytopenia (HCC)     Past Surgical History:  Procedure Laterality Date  . CESAREAN SECTION N/A 05/30/2019   Procedure: CESAREAN SECTION;  Surgeon: 07/30/2019, MD;  Location: [facility] LD ORS;  Service: Obstetrics;  Laterality: N/A;  . FOREIGN BODY REMOVAL Left    sewing needle removed from big toe    Family History  Problem Relation Age of Onset  . Healthy Mother   . Healthy Father     Social History   Socioeconomic History  . Marital status: Single    Spouse name: Not on file  . Number of children: Not on file  . Years of education: Not on file  . Highest education level: Not on file  Occupational History  . Not on file  Tobacco Use  . Smoking status: Never Smoker  . Smokeless tobacco: Never Used  Vaping Use  . Vaping Use: Never used   Substance and Sexual Activity  . Alcohol use: Never  . Drug use: Not Currently    Types: Marijuana    Comment: last use a few months ago  . Sexual activity: Not Currently    Birth control/protection: None  Other Topics Concern  . Not on file  Social History Narrative  . Not on file   Social Determinants of Health   Financial Resource Strain: Not on file  Food Insecurity: Not on file  Transportation Needs: Not on file  Physical Activity: Not on file  Stress: Not on file  Social Connections: Not on file  Intimate Partner Violence: Not on file    ROS Review of Systems All review of systems negative except what is listed in the HPI  Objective:   Today's Vitals: BP 106/64   Pulse 71   Ht 5\' 2"  (1.575 m)   Wt 142 lb 6.4 oz (64.6 kg)   SpO2 100%   BMI 26.05 kg/m   Physical Exam Vitals and nursing note reviewed.  Constitutional:      Appearance: Normal appearance. She is normal weight.  HENT:     Head: Normocephalic and atraumatic.     Right Ear: Tympanic membrane, ear canal and external ear normal.     Left Ear: Tympanic membrane, ear canal and external ear normal.     Nose: Nose normal. No congestion.     Mouth/Throat:     Mouth: Mucous membranes are moist.     Pharynx: Oropharynx is clear.  Eyes:     Extraocular Movements: Extraocular movements intact.     Conjunctiva/sclera: Conjunctivae normal.     Pupils: Pupils are equal, round, and reactive to light.  Cardiovascular:     Rate and Rhythm: Normal rate and regular rhythm.     Pulses: Normal pulses.     Heart sounds: Normal heart sounds.  Pulmonary:     Effort: Pulmonary effort is normal.     Breath sounds: Normal breath sounds.  Abdominal:     General: Abdomen is flat. Bowel sounds are normal. There is distension.     Tenderness: There is no abdominal tenderness. There is no right CVA tenderness, left CVA tenderness, guarding or rebound.     Comments: Moderately firm abdomen with some distention present.  No pain on light or deep palpation. BS are active and clear throughout.   Musculoskeletal:        General: Normal range of motion.     Cervical back: Normal range of motion and neck supple. No rigidity or tenderness.     Right lower leg: No edema.     Left lower leg: No edema.  Lymphadenopathy:     Cervical: No cervical adenopathy.  Skin:    General: Skin is warm and  dry.     Capillary Refill: Capillary refill takes less than 2 seconds.  Neurological:     General: No focal deficit present.     Mental Status: She is alert and oriented to person, place, and time.     Cranial Nerves: No cranial nerve deficit.     Sensory: No sensory deficit.     Motor: No weakness.     Coordination: Coordination normal.     Gait: Gait normal.  Psychiatric:        Mood and Affect: Mood normal.        Behavior: Behavior normal.        Thought Content: Thought content normal.        Judgment: Judgment normal.     Assessment & Plan:   Problem List Items Addressed This Visit      Other   Thrombocytopenia (HCC)    History of thrombocytopenia in pregnancy. Labs have not been checked in about a year.  Will check lab work today. Discussed importance of regular follow-up with OB GYN if she is planning on pregnancy in the near future given history.  Expressed understanding.  Will make changes to plan of care if needed based on labs.       Encounter to establish care - Primary    Review of past and current medical concerns, family history, social history, and medications. Recommendations for health maintenance provided.  Will obtain labs today. [facility].      Constipation    Intermittent constipation with no alarm signs present.  Unfortunately, she has been unable to tolerate BID dosing of Miralax due to diarrhea.  Discussed the importance of regular exercise and diet to help facilitate regular bowel movements.  Recommend increase activity to  20 minutes every day of walking. Increase water consumption to minimum of 64 ounces per day. Increase daily fiber intake with fresh fruit and vegetables.  Start docusate sodium twice a day every day- if stools become too soft, decrease dosing to once a day or every other day.  If no BM in 2-3 days recommend 1/2 cap full of Miralax twice a day until bowel movements are started then stop.  If this is not effective or tolerated, follow-up.       Relevant Medications   docusate sodium (COLACE) 100 MG capsule   polyethylene glycol (MIRALAX / GLYCOLAX) 17 g packet   Other Relevant Orders   Thyroid Panel With TSH   Laboratory examination ordered as part of a routine general medical examination    CBC, CMP, TSH, and Lipids ordered today.  TSH monitoring for constipation and menstrual cycle irregularities.       Relevant Orders   CBC with Differential/Platelet (Completed)   Comprehensive metabolic panel (Completed)   Lipid panel   Irregular menses    Menstrual periods occurring monthly, but not consistently on time or in length since delivery 12 months ago.  Will monitor TSH today.  Discussed that it takes about 18 months for the body to return to normal after giving birth.  Recommend that she wait at least 18 months before considering pregnancy again, but strongly encouraged her to start a pre natal vitamin at this time in preparation for possible pregnancy.  Discussed cycle tracking and to let me know if they continue to be irregular or she begins to develop any additional symptoms.  Script for pre-natal vitamin sent today.       Relevant  Orders   Thyroid Panel With TSH      Outpatient Encounter Medications as of 06/29/2020  Medication Sig  . docusate sodium (COLACE) 100 MG capsule Take 1 capsule (100 mg total) by mouth 2 (two) times daily.  . polyethylene glycol (MIRALAX / GLYCOLAX) 17 g packet Take 1/2 dose of Miralax twice a day if no bowel movement in 2-3 days.  . prenatal vitamin  w/FE, FA (NATACHEW) 29-1 MG CHEW chewable tablet Chew 1 tablet by mouth daily.  . [DISCONTINUED] acetaminophen (TYLENOL) 325 MG tablet Take 2 tablets (650 mg total) by mouth every 6 (six) hours as needed for mild pain (temperature > 101.5.).  . [DISCONTINUED] amLODipine (NORVASC) 5 MG tablet Take 2 tablets (10 mg total) by mouth daily.  . [DISCONTINUED] enalapril (VASOTEC) 10 MG tablet Take 1 tablet (10 mg total) by mouth daily.  . [DISCONTINUED] etonogestrel (NEXPLANON) 68 MG IMPL implant 1 each by Subdermal route once.  . [DISCONTINUED] ferrous sulfate 325 (65 FE) MG tablet Take 1 tablet (325 mg total) by mouth 2 (two) times daily with a meal.  . [DISCONTINUED] oxyCODONE (OXY IR/ROXICODONE) 5 MG immediate release tablet Take 1-2 tablets (5-10 mg total) by mouth every 4 (four) hours as needed for moderate pain.  . [DISCONTINUED] polyethylene glycol (MIRALAX / GLYCOLAX) 17 g packet Take 17 g by mouth daily.  . [DISCONTINUED] Prenatal Vit-Fe Fumarate-FA (PRENATAL MULTIVITAMIN) TABS tablet Take 1 tablet by mouth daily at 12 noon.   No facility-administered encounter medications on file as of 06/29/2020.    Follow-up: Return in 1 year (on 06/29/2021) for Labs today- CPE in 1 year.   Tollie Eth, NP

## 2020-06-29 NOTE — Patient Instructions (Addendum)
Recommendations from today's visit:   Please schedule this at checkout today at a time that is convenient for you.   Our lab is located on the ground floor of this building. You can have the labs done on the same day as the visit or a few days in advance. Please be sure you have not had anything to eat or drink (other than water or black coffee) for at least 8 hours prior to the test to make sure the results are accurate.   I will work to get records from the health department for your pap smear results.   CONSTIPATION 1/2 capful Miralax once or twice in one day if you have had constipation for 2-3 days to help promote a bowel movement. You can repeat this every day until you have a bowel movement . I would like you to try a stool softener every morning and every evening to prevent constipation. If this causes your stool to be too soft, then you can cut it back to every other day.  Be sure you are drinking at least 64 ounces of water every day to make sure that you have enough water in your system to help with softer and regular bowel movement   PREGNANCY CONSIDERATIONS I recommend that you start taking a prenatal vitamin if you are thinking of getting pregnant again. We typically recommend waiting 18 months between pregnancies to allow your body to return to baseline. If you should get pregnant again, be sure that you seek OB GYN services [place] [facility] on for monitoring of your blood pressure and blood levels since you had complications with your last pregnancy.   EXERCISE I do recommend you try to get a 20 minute walk in every day to help with your overall health and with constipation. This can really be effective.   Thank you for choosing [facility] for your Primary Care needs. I am excited for the opportunity to partner with you to meet your health care goals. It was a pleasure meeting you today!  I am an Adult-Geriatric Nurse Practitioner with a background in  caring for patients for more than 20 years. I received my Paediatric nurse in Nursing and my Doctor of Nursing Practice degrees at [employer]. I received additional fellowship training in primary care and sports medicine after receiving my doctorate degree. I provide primary care and sports medicine services to patients age 22 and older within this office. I am also a provider with the [facility] and the director of the APP Fellowship with [facility].  I am a [place] native, but have called the [place] area home for nearly 20 years and am proud to be a member of this community.   I am passionate about providing the best service to you through preventive medicine and supportive care. I consider you a part of the medical team and value your input. I work diligently to ensure that you are heard and your needs are met in a safe and effective manner. I want you to feel comfortable with me as your provider and want you to know that your health concerns are important to me.   For your information, our office hours are Monday- Friday 8:00 AM - 5:00 PM At this time I am not in the office on Wednesdays.  If you have questions or concerns, please call our office at [phone] or send [place] a MyChart message and we will respond as  quickly as possible.   For all urgent or time sensitive needs we ask that you please call the office to avoid delays. MyChart is not constantly monitored and replies may take up to 72 business hours.  MyChart Policy: . MyChart allows for you to see your visit notes, after visit summary, provider recommendations, lab and tests results, make an appointment, request refills, and contact your provider or the office for non-urgent questions or concerns.  . Providers are seeing patients during normal business hours and do not have built in time to review MyChart messages. We ask that you allow a minimum of 72 business hours for MyChart message  responses.  . Complex MyChart concerns may require a visit. Your provider may request you schedule a virtual or in person visit to ensure we are providing the best care possible. . MyChart messages sent after 4:00 PM on Friday will not be received by the provider until Monday morning.    Lab and Test Results: . You will receive your lab and test results on MyChart as soon as they are completed and results have been sent by the lab or testing facility. Due to this service, you will receive your results BEFORE your provider.  . Please allow a minimum of 72 business hours for your provider to receive and review lab and test results and contact you about.   . Most lab and test result comments from the provider will be sent through MyChart. Your provider may recommend changes to the plan of care, follow-up visits, repeat testing, ask questions, or request an office visit to discuss these results. You may reply directly to this message or call the office at [phone] to provide information for the provider or set up an appointment. . In some instances, you will be called with test results and recommendations. Please let us know if this is preferred and we will make note of this in your chart to provide this for you.    . If you have not heard a response to your lab or test results in 72 business hours, please call the office to let us know.   After Hours: . For all non-emergency after hours needs, please call the office at [phone] and select the option to reach the on-call provider service. On-call services are shared between multiple [facility] offices and therefore it will not be possible to speak directly with your provider. On-call providers may provide medical advice and recommendations, but are unable to provide refills for maintenance medications.  . For all emergency or urgent medical needs after normal business hours, we recommend that you seek care at the closest Urgent Care or Emergency  Department to ensure appropriate treatment in a timely manner.  . [facility] has a 24 hour emergency room located on the ground floor for your convenience.    Please do not hesitate to reach out to us with concerns.   Thank you, again, for choosing me as your health care partner. I appreciate your trust and look forward to learning more about you.   SaraBeth Early, DNP, AGNP-c __________________________________________________________________    

## 2020-06-29 NOTE — Assessment & Plan Note (Signed)
Review of past and current medical concerns, family history, social history, and medications. Recommendations for health maintenance provided.  Will obtain labs today. Records will be requested from [facility] for pap results from 12/2019.

## 2020-06-29 NOTE — Assessment & Plan Note (Signed)
History of thrombocytopenia in pregnancy. Labs have not been checked in about a year.  Will check lab work today. Discussed importance of regular follow-up with OB GYN if she is planning on pregnancy in the near future given history.  Expressed understanding.  Will make changes to plan of care if needed based on labs.

## 2020-06-29 NOTE — Assessment & Plan Note (Signed)
Intermittent constipation with no alarm signs present.  Unfortunately, she has been unable to tolerate BID dosing of Miralax due to diarrhea.  Discussed the importance of regular exercise and diet to help facilitate regular bowel movements.  Recommend increase activity to 20 minutes every day of walking. Increase water consumption to minimum of 64 ounces per day. Increase daily fiber intake with fresh fruit and vegetables.  Start docusate sodium twice a day every day- if stools become too soft, decrease dosing to once a day or every other day.  If no BM in 2-3 days recommend 1/2 cap full of Miralax twice a day until bowel movements are started then stop.  If this is not effective or tolerated, follow-up.

## 2020-06-29 NOTE — Assessment & Plan Note (Signed)
Menstrual periods occurring monthly, but not consistently on time or in length since delivery 12 months ago.  Will monitor TSH today.  Discussed that it takes about 18 months for the body to return to normal after giving birth.  Recommend that she wait at least 18 months before considering pregnancy again, but strongly encouraged her to start a pre natal vitamin at this time in preparation for possible pregnancy.  Discussed cycle tracking and to let me know if they continue to be irregular or she begins to develop any additional symptoms.  Script for pre-natal vitamin sent today.

## 2020-06-30 LAB — THYROID PANEL WITH TSH
Free Thyroxine Index: 2.1 (ref 1.2–4.9)
T3 Uptake Ratio: 27 % (ref 24–39)
T4, Total: 7.7 ug/dL (ref 4.5–12.0)
TSH: 0.943 u[IU]/mL (ref 0.450–4.500)

## 2020-07-01 ENCOUNTER — Telehealth (HOSPITAL_BASED_OUTPATIENT_CLINIC_OR_DEPARTMENT_OTHER): Payer: Self-pay

## 2020-07-01 NOTE — Progress Notes (Signed)
Thyroid function normal- no changes.  LDL (bad cholesterol) slightly elevated, but HDL (good cholesterol) is great. Monitor diet to decrease foods high in saturated fats (fried foods, oils that are solid at room temperature).   Metabolic panel looks good overall. Protein levels are a little low, but look better than they have been in the past. Recommend increased lean proteins in the diet.   Platelets are still low. Recommend follow-up with hematology for recommendations.

## 2020-07-01 NOTE — Telephone Encounter (Signed)
-----   Message from Tollie Eth, NP sent at 07/01/2020  8:48 AM EDT ----- Thyroid function normal- no changes.  LDL (bad cholesterol) slightly elevated, but HDL (good cholesterol) is great. Monitor diet to decrease foods high in saturated fats (fried foods, oils that are solid at room temperature).   Metabolic panel looks good overall. Protein levels are a little low, but look better than they have been in the past. Recommend increased lean proteins in the diet.   Platelets are still low. Recommend follow-up with hematology for recommendations.

## 2020-07-01 NOTE — Telephone Encounter (Signed)
Results released by Sarabeth Early, AGNP and reviewed by patient via MyChart. Instructed patient to contact the office with any questions or concerns.  

## 2020-08-23 ENCOUNTER — Encounter (HOSPITAL_COMMUNITY): Payer: Self-pay | Admitting: Obstetrics & Gynecology

## 2020-08-23 ENCOUNTER — Inpatient Hospital Stay (HOSPITAL_COMMUNITY): Payer: Medicaid Other

## 2020-08-23 ENCOUNTER — Inpatient Hospital Stay (HOSPITAL_COMMUNITY)
Admission: AD | Admit: 2020-08-23 | Discharge: 2020-08-23 | Disposition: A | Discharge status: Home / Self Care | Payer: Medicaid Other | Attending: Obstetrics & Gynecology | Admitting: Obstetrics & Gynecology

## 2020-08-23 ENCOUNTER — Other Ambulatory Visit: Payer: Self-pay

## 2020-08-23 DIAGNOSIS — K5900 Constipation, unspecified: Secondary | ICD-10-CM | POA: Diagnosis not present

## 2020-08-23 DIAGNOSIS — O208 Other hemorrhage in early pregnancy: Secondary | ICD-10-CM | POA: Diagnosis present

## 2020-08-23 DIAGNOSIS — O418X10 Other specified disorders of amniotic fluid and membranes, first trimester, not applicable or unspecified: Secondary | ICD-10-CM | POA: Diagnosis not present

## 2020-08-23 DIAGNOSIS — Z3A01 Less than 8 weeks gestation of pregnancy: Secondary | ICD-10-CM | POA: Insufficient documentation

## 2020-08-23 DIAGNOSIS — D696 Thrombocytopenia, unspecified: Secondary | ICD-10-CM | POA: Diagnosis not present

## 2020-08-23 DIAGNOSIS — O209 Hemorrhage in early pregnancy, unspecified: Secondary | ICD-10-CM

## 2020-08-23 DIAGNOSIS — O26899 Other specified pregnancy related conditions, unspecified trimester: Secondary | ICD-10-CM

## 2020-08-23 DIAGNOSIS — R109 Unspecified abdominal pain: Secondary | ICD-10-CM | POA: Insufficient documentation

## 2020-08-23 DIAGNOSIS — O26891 Other specified pregnancy related conditions, first trimester: Secondary | ICD-10-CM | POA: Diagnosis not present

## 2020-08-23 DIAGNOSIS — Z3491 Encounter for supervision of normal pregnancy, unspecified, first trimester: Secondary | ICD-10-CM

## 2020-08-23 DIAGNOSIS — O468X1 Other antepartum hemorrhage, first trimester: Secondary | ICD-10-CM

## 2020-08-23 DIAGNOSIS — O99111 Other diseases of the blood and blood-forming organs and certain disorders involving the immune mechanism complicating pregnancy, first trimester: Secondary | ICD-10-CM | POA: Diagnosis not present

## 2020-08-23 DIAGNOSIS — O99119 Other diseases of the blood and blood-forming organs and certain disorders involving the immune mechanism complicating pregnancy, unspecified trimester: Secondary | ICD-10-CM

## 2020-08-23 DIAGNOSIS — O99611 Diseases of the digestive system complicating pregnancy, first trimester: Secondary | ICD-10-CM | POA: Insufficient documentation

## 2020-08-23 LAB — WET PREP, GENITAL
Clue Cells Wet Prep HPF POC: NONE SEEN
Sperm: NONE SEEN
Trich, Wet Prep: NONE SEEN
Yeast Wet Prep HPF POC: NONE SEEN

## 2020-08-23 LAB — CBC
HCT: 40.2 % (ref 36.0–46.0)
Hemoglobin: 13.2 g/dL (ref 12.0–15.0)
MCH: 27.4 pg (ref 26.0–34.0)
MCHC: 32.8 g/dL (ref 30.0–36.0)
MCV: 83.6 fL (ref 80.0–100.0)
Platelets: 86 10*3/uL — ABNORMAL LOW (ref 150–400)
RBC: 4.81 MIL/uL (ref 3.87–5.11)
RDW: 13.2 % (ref 11.5–15.5)
WBC: 8.1 10*3/uL (ref 4.0–10.5)
nRBC: 0 % (ref 0.0–0.2)

## 2020-08-23 LAB — URINALYSIS, ROUTINE W REFLEX MICROSCOPIC
Bilirubin Urine: NEGATIVE
Glucose, UA: NEGATIVE mg/dL
Ketones, ur: NEGATIVE mg/dL
Leukocytes,Ua: NEGATIVE
Nitrite: NEGATIVE
Protein, ur: NEGATIVE mg/dL
Specific Gravity, Urine: 1.027 (ref 1.005–1.030)
pH: 5 (ref 5.0–8.0)

## 2020-08-23 LAB — POCT PREGNANCY, URINE: Preg Test, Ur: POSITIVE — AB

## 2020-08-23 LAB — HCG, QUANTITATIVE, PREGNANCY: hCG, Beta Chain, Quant, S: 25587 m[IU]/mL — ABNORMAL HIGH (ref <5)

## 2020-08-23 NOTE — MAU Provider Note (Signed)
History     CSN: [id number]  Arrival date and time: 08/23/20 8101   Event Date/Time   First Provider Initiated Contact with Patient 08/23/20 0913      Chief Complaint  Patient presents with   Vaginal Bleeding   HPI Ruth Solomon is a 20 y.o. G2P1001 at [redacted]w[redacted]d who presents with vaginal bleeding and abdominal cramping. Symptoms started this morning. Reports brown spotting since this morning. No bright red bleeding. Not saturating pads or passing blood clots. Reports some mild abdominal cramping in her suprapubic area. Has been having issues with constipation which she has been treating with OTC meds. Denies n/v/d, fever/chills, or abnormal discharge.   OB History     Gravida  2   Para  1   Term  1   Preterm      AB      Living  1      SAB      IAB      Ectopic      Multiple  0   Live Births  1           Past Medical History:  Diagnosis Date   Thrombocytopenia (HCC)     Past Surgical History:  Procedure Laterality Date   CESAREAN SECTION N/A 05/30/2019   Procedure: CESAREAN SECTION;  Surgeon: Adam Phenix, MD;  Location: [facility];  Service: Obstetrics;  Laterality: N/A;   FOREIGN BODY REMOVAL Left    sewing needle removed from big toe    Family History  Problem Relation Age of Onset   Healthy Mother    Healthy Father     Social History   Tobacco Use   Smoking status: Never   Smokeless tobacco: Never  Vaping Use   Vaping Use: Never used  Substance Use Topics   Alcohol use: Never   Drug use: Not Currently    Types: Marijuana    Comment: last use a few months ago    Allergies: No Known Allergies  No medications prior to admission.    Review of Systems  Constitutional: Negative.   Gastrointestinal:  Positive for abdominal pain. Negative for diarrhea, nausea and vomiting.  Genitourinary:  Positive for vaginal bleeding. Negative for dysuria and vaginal discharge.  Physical Exam   Blood pressure 100/67, pulse 77, temperature 98.3  F (36.8 C), temperature source Oral, resp. rate 16, height 5\' 2"  (1.575 m), weight 61 kg, last menstrual period 07/18/2020, SpO2 100 %, not currently breastfeeding.  Physical Exam Vitals and nursing note reviewed.  Constitutional:      General: She is not in acute distress.    Appearance: Normal appearance.  HENT:     Head: Normocephalic and atraumatic.  Eyes:     General: No scleral icterus. Pulmonary:     Effort: Pulmonary effort is normal. No respiratory distress.  Abdominal:     General: Abdomen is flat.     Palpations: Abdomen is soft.     Tenderness: There is no abdominal tenderness.  Skin:    General: Skin is warm and dry.  Neurological:     Mental Status: She is alert.  Psychiatric:        Mood and Affect: Mood normal.        Behavior: Behavior normal.    MAU Course  Procedures Results for orders placed or performed during the hospital encounter of 08/23/20 (from the past 24 hour(s))  Pregnancy, urine POC     Status: Abnormal   Collection Time: 08/23/20  8:50 AM  Result Value Ref Range   Preg Test, Ur POSITIVE (A) NEGATIVE  Urinalysis, Routine w reflex microscopic Urine, Clean Catch     Status: Abnormal   Collection Time: 08/23/20  8:53 AM  Result Value Ref Range   Color, Urine YELLOW YELLOW   APPearance HAZY (A) CLEAR   Specific Gravity, Urine 1.027 1.005 - 1.030   pH 5.0 5.0 - 8.0   Glucose, UA NEGATIVE NEGATIVE mg/dL   Hgb urine dipstick MODERATE (A) NEGATIVE   Bilirubin Urine NEGATIVE NEGATIVE   Ketones, ur NEGATIVE NEGATIVE mg/dL   Protein, ur NEGATIVE NEGATIVE mg/dL   Nitrite NEGATIVE NEGATIVE   Leukocytes,Ua NEGATIVE NEGATIVE   RBC / HPF 0-5 0 - 5 RBC/hpf   WBC, UA 0-5 0 - 5 WBC/hpf   Bacteria, UA RARE (A) NONE SEEN   Squamous Epithelial / LPF 0-5 0 - 5   Mucus PRESENT   Wet prep, genital     Status: Abnormal   Collection Time: 08/23/20  9:24 AM  Result Value Ref Range   Yeast Wet Prep HPF POC NONE SEEN NONE SEEN   Trich, Wet Prep NONE SEEN  NONE SEEN   Clue Cells Wet Prep HPF POC NONE SEEN NONE SEEN   WBC, Wet Prep HPF POC MANY (A) NONE SEEN   Sperm NONE SEEN   CBC     Status: Abnormal   Collection Time: 08/23/20  9:35 AM  Result Value Ref Range   WBC 8.1 4.0 - 10.5 K/uL   RBC 4.81 3.87 - 5.11 MIL/uL   Hemoglobin 13.2 12.0 - 15.0 g/dL   HCT 60.1 09.3 - 23.5 %   MCV 83.6 80.0 - 100.0 fL   MCH 27.4 26.0 - 34.0 pg   MCHC 32.8 30.0 - 36.0 g/dL   RDW 57.3 22.0 - 25.4 %   Platelets 86 (L) 150 - 400 K/uL   nRBC 0.0 0.0 - 0.2 %  hCG, quantitative, pregnancy     Status: Abnormal   Collection Time: 08/23/20  9:35 AM  Result Value Ref Range   hCG, Beta Chain, Quant, S 25,587 (H) <5 mIU/mL   US OB LESS THAN 14 WEEKS WITH OB TRANSVAGINAL  Result Date: 08/23/2020 CLINICAL DATA:  Abdominal cramping and vaginal bleeding. Pregnant patient, 5 weeks and 1 day based on her last menstrual period. EXAM: OBSTETRIC <14 WK [place] AND TRANSVAGINAL OB US TECHNIQUE: Both transabdominal and transvaginal ultrasound examinations were performed for complete evaluation of the gestation as well as the maternal uterus, adnexal regions, and pelvic cul-de-sac. Transvaginal technique was performed to assess early pregnancy. COMPARISON:  None. FINDINGS: Intrauterine gestational sac: Single Yolk sac:  Visualized. Embryo:  Not Visualized. Cardiac Activity: Not Visualized. MSD: 11.5 mm   5 w   6 d Subchorionic hemorrhage:  Small subchronic hemorrhage. Maternal uterus/adnexae: No uterine masses. Normal ovaries. No visualized adnexal masses, assessment somewhat limited by bowel gas. No free fluid. IMPRESSION: 1. Finding consistent with an early intrauterine pregnancy, with a gestational sac and yolk sac, but no visualized embryo. Recommend correlation with the beta HCG level and follow-up ultrasound imaging in 10-14 days to document normal pregnancy progression. 2. Small subchronic hemorrhage.  No other pregnancy complication. Electronically Signed   By: Amie Portland M.D.    On: 08/23/2020 11:13    MDM +UPT UA, wet prep, GC/chlamydia, CBC, ABO/Rh, quant hCG, and [place] today to rule out ectopic pregnancy which can be life threatening.   RH positive. No active bleeding today.  Rhogam not indicated.   Ultrasound shows IUGS with yolk sac & a subchorionic hemorrhage. Discussed results with patient. She plans on following up with [facility] for prenatal care  Low platelets. Patient previously diagnosed with thrombocytopenia & plans on following up with a hematologist Assessment and Plan   1. Normal IUP (intrauterine pregnancy) on prenatal ultrasound, first trimester   2. Vaginal bleeding in pregnancy, first trimester   3. Abdominal cramping affecting pregnancy   4. Subchorionic hematoma in first trimester, single or unspecified fetus   5. Thrombocytopenia affecting pregnancy (HCC)   6. [redacted] weeks gestation of pregnancy    -bleeding precautions discussed -f/u with [facility] as scheduled -f/u with hematologist -GC/CT pending  Judeth Horn 08/23/2020, 12:52 PM

## 2020-08-23 NOTE — MAU Note (Signed)
Ruth Solomon is a 20 y.o. at [redacted]w[redacted]d here in MAU reporting: this morning when she woke up she saw a spot of blood and then was seeing brown discharge when she wipes. Intermittent cramping, no pain currently. No other abnormal discharge.  LMP: 5/21/2  Onset of complaint: today  Pain score: 0/10  Vitals:   08/23/20 0857  BP: 113/62  Pulse: 69  Resp: 16  Temp: 98.3 F (36.8 C)  SpO2: 100%     Lab orders placed from triage: ua, upt

## 2020-08-24 LAB — GC/CHLAMYDIA PROBE AMP (~~LOC~~) NOT AT ARMC
Chlamydia: NEGATIVE
Comment: NEGATIVE
Comment: NORMAL
Neisseria Gonorrhea: NEGATIVE

## 2020-11-04 ENCOUNTER — Ambulatory Visit (HOSPITAL_COMMUNITY)
Admission: EM | Admit: 2020-11-04 | Discharge: 2020-11-04 | Disposition: A | Discharge status: Home / Self Care | Payer: Medicaid Other | Attending: Emergency Medicine | Admitting: Emergency Medicine

## 2020-11-04 ENCOUNTER — Other Ambulatory Visit: Payer: Self-pay

## 2020-11-04 ENCOUNTER — Encounter (HOSPITAL_COMMUNITY): Payer: Self-pay | Admitting: Emergency Medicine

## 2020-11-04 DIAGNOSIS — N390 Urinary tract infection, site not specified: Secondary | ICD-10-CM

## 2020-11-04 DIAGNOSIS — R300 Dysuria: Secondary | ICD-10-CM

## 2020-11-04 DIAGNOSIS — R109 Unspecified abdominal pain: Secondary | ICD-10-CM

## 2020-11-04 LAB — POCT URINALYSIS DIPSTICK, ED / UC
Bilirubin Urine: NEGATIVE
Glucose, UA: NEGATIVE mg/dL
Ketones, ur: NEGATIVE mg/dL
Nitrite: NEGATIVE
Protein, ur: NEGATIVE mg/dL
Specific Gravity, Urine: 1.025 (ref 1.005–1.030)
Urobilinogen, UA: 4 mg/dL — ABNORMAL HIGH (ref 0.0–1.0)
pH: 7 (ref 5.0–8.0)

## 2020-11-04 LAB — POC URINE PREG, ED: Preg Test, Ur: NEGATIVE

## 2020-11-04 MED ORDER — CEFTRIAXONE SODIUM 1 G IJ SOLR
INTRAMUSCULAR | Status: AC
  Filled 2020-11-04: qty 10

## 2020-11-04 MED ORDER — LIDOCAINE HCL (PF) 1 % IJ SOLN
INTRAMUSCULAR | Status: AC
  Filled 2020-11-04: qty 2

## 2020-11-04 MED ORDER — SULFAMETHOXAZOLE-TRIMETHOPRIM 800-160 MG PO TABS: 1.0000 | ORAL_TABLET | Freq: Two times a day (BID) | ORAL | 0 refills | Status: AC

## 2020-11-04 MED ORDER — CEFTRIAXONE SODIUM 1 G IJ SOLR
1.0000 g | Freq: Once | INTRAMUSCULAR | Status: AC
  Administered 2020-11-04: 1 g via INTRAMUSCULAR

## 2020-11-04 NOTE — ED Provider Notes (Signed)
[facility]    CSN: [id number] Arrival date & time: 11/04/20  1761      History   Chief Complaint Chief Complaint  Patient presents with   Shoulder Pain    HPI Ruth Solomon is a 20 y.o. female.   Patient reports 3 days of right-sided flank pain, states she is also noticed some blood in her urine.  Patient states her menstrual cycle is irregular and does not recall the first day of her last menstrual period, patient also reports recent termination of pregnancy with mifepristone and has not had full cycle since.  Patient denies burning with urination, abdominal pain, increased frequency of urination, pelvic pressure, incomplete emptying, fever, aches, chills, nausea, vomiting, diarrhea.  Urine dip today reveals blood and urobilinogen as well as leukocytes.  Patient states that when she got up this morning her shoulder was hurting but it feels much better now.  The history is provided by the patient.  Hematuria This is a new problem. The current episode started more than 2 days ago. The problem occurs constantly. The problem has not changed since onset.Pertinent negatives include no chest pain, no abdominal pain, no headaches and no shortness of breath. Nothing aggravates the symptoms. Nothing relieves the symptoms. She has tried nothing for the symptoms.   Past Medical History:  Diagnosis Date   Thrombocytopenia [facility])     Patient Active Problem List   Diagnosis Date Noted   Encounter to establish care 06/29/2020   Constipation 06/29/2020   Laboratory examination ordered as part of a routine general medical examination 06/29/2020   Irregular menses 06/29/2020   Delivery of pregnancy by cesarean section 06/01/2019   Postpartum anemia 06/01/2019   Thrombocytopenia (HCC) 05/30/2019   Benign gestational thrombocytopenia in third trimester (HCC) 04/10/2019    Past Surgical History:  Procedure Laterality Date   CESAREAN SECTION N/A 05/30/2019   Procedure: CESAREAN  SECTION;  Surgeon: Adam Phenix, MD;  Location: [facility];  Service: Obstetrics;  Laterality: N/A;   FOREIGN BODY REMOVAL Left    sewing needle removed from big toe    OB History     Gravida  2   Para  1   Term  1   Preterm      AB      Living  1      SAB      IAB      Ectopic      Multiple  0   Live Births  1            Home Medications    Prior to Admission medications   Medication Sig Start Date End Date Taking? Authorizing Provider  sulfamethoxazole-trimethoprim (BACTRIM DS) 800-160 MG tablet Take 1 tablet by mouth 2 (two) times daily for 5 days. 11/04/20 11/09/20 Yes Theadora Rama Scales, PA-C    Family History Family History  Problem Relation Age of Onset   Healthy Mother    Healthy Father     Social History Social History   Tobacco Use   Smoking status: Never   Smokeless tobacco: Never  Vaping Use   Vaping Use: Never used  Substance Use Topics   Alcohol use: Never   Drug use: Not Currently    Types: Marijuana    Comment: last use a few months ago     Allergies   Patient has no known allergies.   Review of Systems Review of Systems  Respiratory:  Negative for shortness of breath.  Cardiovascular:  Negative for chest pain.  Gastrointestinal:  Negative for abdominal pain.  Genitourinary:  Positive for flank pain and hematuria.  Neurological:  Negative for headaches.  All other systems reviewed and are negative.   Physical Exam Triage Vital Signs ED Triage Vitals  Enc Vitals Group     BP 11/04/20 1026 112/70     Pulse Rate 11/04/20 1026 (!) 103     Resp 11/04/20 1026 18     Temp 11/04/20 1026 98.3 F (36.8 C)     Temp src --      SpO2 11/04/20 1026 97 %     Weight --      Height --      Head Circumference --      Peak Flow --      Pain Score 11/04/20 1024 7     Pain Loc --      Pain Edu? --      Excl. in GC? --    No data found.  Updated Vital Signs BP 112/70   Pulse (!) 103   Temp 98.3 F (36.8 C)    Resp 18   LMP 07/18/2020   SpO2 97%   Breastfeeding Unknown Comment: recent abortion  Visual Acuity Right Eye Distance:   Left Eye Distance:   Bilateral Distance:    Right Eye Near:   Left Eye Near:    Bilateral Near:     Physical Exam Constitutional:      Appearance: Normal appearance.  HENT:     Head: Normocephalic and atraumatic.  Cardiovascular:     Rate and Rhythm: Normal rate and regular rhythm.     Heart sounds: Normal heart sounds.  Pulmonary:     Effort: Pulmonary effort is normal.     Breath sounds: Normal breath sounds.  Abdominal:     General: Abdomen is flat. Bowel sounds are normal.     Palpations: Abdomen is soft.     Tenderness: There is no abdominal tenderness. There is right CVA tenderness and left CVA tenderness. There is no guarding.  Musculoskeletal:        General: Normal range of motion.     Cervical back: Normal range of motion and neck supple.  Skin:    General: Skin is warm and dry.  Neurological:     General: No focal deficit present.     Mental Status: She is alert and oriented to person, place, and time. Mental status is at baseline.  Psychiatric:        Mood and Affect: Mood normal.        Behavior: Behavior normal.        Thought Content: Thought content normal.        Judgment: Judgment normal.     UC Treatments / Results  Labs (all labs ordered are listed, but only abnormal results are displayed) Labs Reviewed  POCT URINALYSIS DIPSTICK, ED / UC - Abnormal; Notable for the following components:      Result Value   Hgb urine dipstick TRACE (*)    Urobilinogen, UA 4.0 (*)    Leukocytes,Ua TRACE (*)    All other components within normal limits  URINE CULTURE  POC URINE PREG, ED    EKG   Radiology No results found.  Procedures Procedures (including critical care time)  Medications Ordered in UC Medications  cefTRIAXone (ROCEPHIN) injection 1 g (has no administration in time range)    Initial Impression / Assessment  and Plan / UC Course  I have reviewed the triage vital signs and the nursing notes.  Pertinent labs & imaging results that were available during my care of the patient were reviewed by me and considered in my medical decision making (see chart for details).  Clinical Course as of 11/04/20 1121  Wed Nov 04, 2020  1039 POC Urinalysis dipstick [LM]  1039 POCT Urinalysis, Dipstick [LM]  1039 POC Urinalysis dipstick [LM]    Clinical Course User Index [LM] Theadora Rama Scales, PA-C    Patient is nontoxic during visit today.  Given 3-day duration of right-sided flank pain and hematuria will treat patient for acute upper urinary tract infection with ceftriaxone injection today and 5-day course of Bactrim twice daily.  Patient advised if she does not have resolution of right-sided flank pain within the next 2 to 3 days that she should seek follow-up care.  Patient verbalized understanding, all questions were addressed. Final Clinical Impressions(s) / UC Diagnoses   Final diagnoses:  Dysuria  Acute right flank pain  Acute upper urinary tract infection     Discharge Instructions      TimesBased on your urinalysis and he described to me today, I believe you have an acute upper urinary tract infection.  You received an injection of an antibiotic called ceftriaxone today which should get you started recovering from infection.  Please begin taking Bactrim for the next 5 days as well, if you can begin your first dose this evening that would be ideal.  Please take all as prescribed.  If you are not feeling better within the next 2 to 3 days, please seek follow-up attention.     ED Prescriptions     Medication Sig Dispense Auth. Provider   sulfamethoxazole-trimethoprim (BACTRIM DS) 800-160 MG tablet Take 1 tablet by mouth 2 (two) times daily for 5 days. 10 tablet Theadora Rama Scales, PA-C      PDMP not reviewed this encounter.   Theadora Rama Scales, PA-C 11/04/20 1123

## 2020-11-04 NOTE — Discharge Instructions (Addendum)
Based on your urinalysis and he described to me today, I believe you have an acute upper urinary tract infection.  You received an injection of an antibiotic called ceftriaxone today which should get you started recovering from infection.  Please begin taking Bactrim for the next 5 days as well, if you can begin your first dose this evening that would be ideal.  Please take all as prescribed.  If you are not feeling better within the next 2 to 3 days, please seek follow-up attention.

## 2020-11-04 NOTE — ED Triage Notes (Signed)
Pt is present today with right side pain that she noticed Friday . Pt states that pain radiates up her right shoulder. Pt also states that she that she feels pressure after urinating and possible hematuria.

## 2020-12-28 ENCOUNTER — Ambulatory Visit (INDEPENDENT_AMBULATORY_CARE_PROVIDER_SITE_OTHER): Payer: Medicaid Other | Admitting: Nurse Practitioner

## 2020-12-28 ENCOUNTER — Other Ambulatory Visit: Payer: Self-pay

## 2020-12-28 ENCOUNTER — Encounter (HOSPITAL_BASED_OUTPATIENT_CLINIC_OR_DEPARTMENT_OTHER): Payer: Self-pay | Admitting: Nurse Practitioner

## 2020-12-28 VITALS — BP 108/82 | HR 74 | Ht 62.0 in | Wt 134.7 lb

## 2020-12-28 DIAGNOSIS — N926 Irregular menstruation, unspecified: Secondary | ICD-10-CM

## 2020-12-28 LAB — POCT URINE PREGNANCY: Preg Test, Ur: NEGATIVE

## 2020-12-28 NOTE — Patient Instructions (Signed)
I am going to get some labs today to see where your hormone levels are and make sure that there is not an imbalance. I am also going to do a blood test for pregnancy, since the urine test was negative, but sometimes these take a while to show positive.   If your labs all look normal, we can get an ultrasound to make sure that the lining of the uterus looks normal.  If the labs come back abnormal, I will be in touch with you on how we can best manage this.

## 2020-12-28 NOTE — Assessment & Plan Note (Addendum)
Irregular vaginal bleeding/spotting in last 2 months.  She has a history of miscarriage in June of this year, but periods normalized after this.  UPT negative today.  Etiology of light/irregular menses unclear at this time.  She has had irregular menses since delivering her last child a year ago, but these had seemed to regulate.  We will plan to get HCG quant today for further evaluation of possible [place] pregnancy.  Will also measure PL, FSH, LH, and TSH today for further evaluation.  If testing negative, will send for transvaginal US to determine if uterine complication present.

## 2020-12-28 NOTE — Progress Notes (Signed)
Established Patient Office Visit  Subjective:  Patient ID: Ruth Solomon, female    DOB: [birth year]  Age: 20 y.o. MRN: [id number]  CC:  Chief Complaint  Patient presents with   Menstrual Problem    Patient states she hasnt had a full menstrual cycle since sept. She admits to having some abdominal cramping, nausea, and headaches that can be relieved with drinking water. She is not currently using any oral contraceptive. She states she has had 2-3 days of spotting intermittently since sept but not a fully cycle.    HPI Ruth Solomon presents for irregular menses. On 08/23/2020 she was seen at [facility]U for spotting and cramping. Quant HCG was positive and it was determined she was at [redacted]w[redacted]d gestation based on LMP. US revealed positive yolk sac without embryo visualized. A subchorionic hemorrhage was also visualized. HCG 25,587 at that time. Recommendations were for F/U US and serial HCG in 10d and referral was sent to [facility]. She reports she was treated with Mifepristone for incomplete abortion. She reports that she bleed until time for her next period and then stopped and her July, August, September periods were normal and on time.  She is RH positive. She did not have a f/u US after miscarriage. On 11/04/2020 a UPT in the ED showed positive UTI and negative UPT.   For the last month her menstrual cycles are irregular. Her last full period started September 12 and lasted 7 days. She started spotting October 11-13 with moderate bleeding the first day then spotting the last two. She then starting spotting again on 10/15, which resolved, then again 10/26-28 spotting.  She is positive for cramping and intermittent nausea which is relieved with water intake. No fever, chills abdominal pain, weakness, fatigue.  She is sexually active and not using contraception. She is not actively trying to become pregnant at this time, but is not preventing. She would be OK if she were to be pregnant  . She has had unprotected sex in the last 2 months.   Past Medical History:  Diagnosis Date   Thrombocytopenia (HCC)     No Known Allergies  ROS Review of Systems All review of systems negative except what is listed in the HPI    Objective:    Physical Exam Vitals and nursing note reviewed.  Constitutional:      Appearance: Normal appearance. She is normal weight.  HENT:     Head: Normocephalic.  Eyes:     Extraocular Movements: Extraocular movements intact.     Conjunctiva/sclera: Conjunctivae normal.     Pupils: Pupils are equal, round, and reactive to light.  Cardiovascular:     Rate and Rhythm: Normal rate and regular rhythm.     Pulses: Normal pulses.     Heart sounds: Normal heart sounds. No murmur heard. Pulmonary:     Effort: Pulmonary effort is normal.     Breath sounds: Normal breath sounds.  Abdominal:     General: Abdomen is flat. Bowel sounds are normal. There is no distension.     Palpations: Abdomen is soft. There is no mass.     Tenderness: There is no abdominal tenderness. There is no right CVA tenderness, left CVA tenderness, guarding or rebound.     Hernia: No hernia is present.  Musculoskeletal:     Cervical back: Normal range of motion.     Right lower leg: No edema.     Left lower leg: No edema.  Skin:  General: Skin is warm and dry.     Capillary Refill: Capillary refill takes less than 2 seconds.  Neurological:     General: No focal deficit present.     Mental Status: She is alert and oriented to person, place, and time.  Psychiatric:        Mood and Affect: Mood normal.        Behavior: Behavior normal.        Thought Content: Thought content normal.        Judgment: Judgment normal.    BP 108/82   Pulse 74   Ht 5\' 2"  (1.575 m)   Wt 134 lb 11.2 oz (61.1 kg)   LMP 10/29/2020   SpO2 98%   BMI 24.64 kg/m  Wt Readings from Last 3 Encounters:  12/28/20 134 lb 11.2 oz (61.1 kg)  08/23/20 134 lb 8 oz (61 kg)  06/29/20 142 lb 6.4  oz (64.6 kg)     Assessment & Plan:   Problem List Items Addressed This Visit     Irregular menses - Primary    Irregular MP in setting of miscarriage in June of this year.  UPT negative today.  Etiology of light/irregular menses unclear at this time.  She has had irregular menses since delivering her last child a year ago, but these had seemed to regulate.  We will plan to get HCG quant today for further evaluation of possible [place] pregnancy.  Will also measure PL, FSH, LH, and TSH today for further evaluation.  If testing negative, will send for transvaginal July to determine if uterine complication present.        Relevant Orders   POCT urine pregnancy (Completed)   Beta hCG quant (ref lab)   FSH/LH   Thyroid Panel With TSH   Prolactin    No orders of the defined types were placed in this encounter.   Follow-up: Return if symptoms worsen or fail to improve, for Based on labs.    [place], NP

## 2020-12-29 LAB — FSH/LH
FSH: 8 m[IU]/mL
LH: 7.4 m[IU]/mL

## 2020-12-29 LAB — THYROID PANEL WITH TSH
Free Thyroxine Index: 2 (ref 1.2–4.9)
T3 Uptake Ratio: 27 % (ref 24–39)
T4, Total: 7.3 ug/dL (ref 4.5–12.0)
TSH: 0.779 u[IU]/mL (ref 0.450–4.500)

## 2020-12-29 LAB — BETA HCG QUANT (REF LAB): hCG Quant: <1 m[IU]/mL

## 2020-12-29 LAB — PROLACTIN: Prolactin: 11.1 ng/mL (ref 4.8–23.3)

## 2021-01-15 ENCOUNTER — Encounter (HOSPITAL_BASED_OUTPATIENT_CLINIC_OR_DEPARTMENT_OTHER): Payer: Self-pay | Admitting: Nurse Practitioner

## 2021-01-28 ENCOUNTER — Encounter (HOSPITAL_BASED_OUTPATIENT_CLINIC_OR_DEPARTMENT_OTHER): Payer: Self-pay | Admitting: Nurse Practitioner

## 2021-01-28 ENCOUNTER — Ambulatory Visit (INDEPENDENT_AMBULATORY_CARE_PROVIDER_SITE_OTHER): Payer: Medicaid Other | Admitting: Nurse Practitioner

## 2021-01-28 ENCOUNTER — Other Ambulatory Visit: Payer: Self-pay

## 2021-01-28 VITALS — BP 130/82 | HR 80 | Ht 62.0 in | Wt 133.2 lb

## 2021-01-28 DIAGNOSIS — N926 Irregular menstruation, unspecified: Secondary | ICD-10-CM

## 2021-01-28 LAB — POCT URINE PREGNANCY: Preg Test, Ur: NEGATIVE

## 2021-01-28 MED ORDER — NORETHINDRONE-ETH ESTRADIOL 1-35 MG-MCG PO TABS: 1.0000 | ORAL_TABLET | Freq: Every day | ORAL | 11 refills | Status: DC

## 2021-01-28 NOTE — Assessment & Plan Note (Signed)
Irregular menses continues Recent lab work negative for hormonal imbalance.  Discussed option of further labs and [place] vs. Starting OCP trial for three months today.  Patient would like to start OCP trial for three months.  We will begin trial today. UTP negative. Patient will contact the office if she does not start menstruating during the placebo week of the first pill pack or if she has irregular bleeding at any time while on the pill pack.  She will contact the office if she fails to start menstruating once she has completed the three month trial on OCP.  Will plan for GYN referral for further evaluation if trial is not successful.

## 2021-01-28 NOTE — Patient Instructions (Addendum)
Lets try the birth control pill for at least 3 months and see if we can get your periods to reset. When you get to the "placebo week" you should start your period in a few days. If you do not start your period at all, please let me know.   Once you stop the pills, you should expect a period about one month after the last one. If you do not have a period within 6 weeks of stopping, please call me.

## 2021-01-28 NOTE — Progress Notes (Signed)
Established Patient Office Visit  Subjective:  Patient ID: Ruth Solomon, female    DOB: [birth year]  Age: 20 y.o. MRN: [id number]  CC:  Chief Complaint  Patient presents with   Menstrual Problem    Patient is in the office due to missed menstrual periods. She only spotted 2 days in 12/2020. No other concerns today to address. UPT was taken in the office today.     HPI Ruth Solomon presents for missed menses.  In June of this year [name] had a miscarriage. She has one child who is a year old.  Immediately following miscarriage, [name] had normal periods until about October. Since that time she had concerns with missed menses.  During the miscarriage she reports that she took a pill to help evacuate the products of conception. She did not have an [place] to ensure all PoC were evacuated.  At her last visit we monitored FSH/LH, HCG, TSH, and Prolactin. All of these results were normal and it was thought that her body was adjusting to miscarriage.  Today she tells me that she continues to have missed menses and irregular cycles.  Her LMP was: last week and she had one to two days of light spotting- prior to that she had a day or two of spotting but less than a month apart.  She is sexually active She is not using contraception UPT negative in office today She has no abdominal pain, tenderness, fevers, chills, changes in BM, urinary symptoms.  She reports that she feels fine otherwise.  She would like to have another child in the future. She does not have a personal or family history of PCOS.   Past Medical History:  Diagnosis Date   Benign gestational thrombocytopenia in third trimester (HCC) 04/10/2019   [x ] f/u heme referral: done 2/24: [facility] heme onc labs. Will see 6wks PP  04/10/19: verified by microscope  CBC Latest Ref Rng & Units 04/10/2019 03/25/2019 12/06/2018  WBC 3.4 - 10.8 x10E3/uL 10.1 - -  Hemoglobin 11.1 - 15.9 g/dL 79.4 80.1 65.5  Hematocrit 34.0 - 46.6 % 33.9(L) 37 37   Platelets 150 - 450 x10E3/uL 92(LL) - 103      Delivery of pregnancy by cesarean section 06/01/2019   Laboratory examination ordered as part of a routine general medical examination 06/29/2020   Postpartum anemia 06/01/2019   Thrombocytopenia (HCC)     Past Surgical History:  Procedure Laterality Date   CESAREAN SECTION N/A 05/30/2019   Procedure: CESAREAN SECTION;  Surgeon: Adam Phenix, MD;  Location: [facility];  Service: Obstetrics;  Laterality: N/A;   FOREIGN BODY REMOVAL Left    sewing needle removed from big toe    Family History  Problem Relation Age of Onset   Healthy Mother    Healthy Father     Social History   Socioeconomic History   Marital status: Single    Spouse name: Not on file   Number of children: Not on file   Years of education: Not on file   Highest education level: Not on file  Occupational History   Not on file  Tobacco Use   Smoking status: Never   Smokeless tobacco: Never  Vaping Use   Vaping Use: Never used  Substance and Sexual Activity   Alcohol use: Never   Drug use: Not Currently    Types: Marijuana    Comment: last use a few months ago   Sexual activity: Not Currently    Birth control/protection:  None  Other Topics Concern   Not on file  Social History Narrative   Not on file   Social Determinants of Health   Financial Resource Strain: Not on file  Food Insecurity: Not on file  Transportation Needs: Not on file  Physical Activity: Not on file  Stress: Not on file  Social Connections: Not on file  Intimate Partner Violence: Not on file    No outpatient medications prior to visit.   No facility-administered medications prior to visit.    No Known Allergies  ROS Review of Systems All review of systems negative except what is listed in the HPI    Objective:    Physical Exam Vitals and nursing note reviewed.  Constitutional:      Appearance: Normal appearance.  HENT:     Head: Normocephalic.  Eyes:     Extraocular  Movements: Extraocular movements intact.     Conjunctiva/sclera: Conjunctivae normal.     Pupils: Pupils are equal, round, and reactive to light.  Cardiovascular:     Rate and Rhythm: Normal rate.     Pulses: Normal pulses.  Pulmonary:     Effort: Pulmonary effort is normal.  Abdominal:     General: Abdomen is flat. Bowel sounds are normal. There is no distension.     Palpations: Abdomen is soft. There is no mass.     Tenderness: There is no abdominal tenderness. There is no right CVA tenderness, left CVA tenderness, guarding or rebound.     Hernia: No hernia is present.  Skin:    General: Skin is warm and dry.  Neurological:     General: No focal deficit present.     Mental Status: She is alert and oriented to person, place, and time.  Psychiatric:        Mood and Affect: Mood normal.        Behavior: Behavior normal.        Thought Content: Thought content normal.        Judgment: Judgment normal.    BP 130/82   Pulse 80   Ht 5\' 2"  (1.575 m)   Wt 133 lb 3.2 oz (60.4 kg)   LMP 07/18/2020   SpO2 98%   BMI 24.36 kg/m  Wt Readings from Last 3 Encounters:  01/28/21 133 lb 3.2 oz (60.4 kg)  12/28/20 134 lb 11.2 oz (61.1 kg)  08/23/20 134 lb 8 oz (61 kg)     Health Maintenance Due  Topic Date Due   Pneumococcal Vaccine 46-89 Years old (1 - PCV) Never done   HPV VACCINES (1 - 2-dose series) Never done   PAP-Cervical Cytology Screening  Never done   PAP SMEAR-Modifier  Never done       Topic Date Due   HPV VACCINES (1 - 2-dose series) Never done    Lab Results  Component Value Date   TSH 0.779 12/28/2020   Lab Results  Component Value Date   WBC 8.1 08/23/2020   HGB 13.2 08/23/2020   HCT 40.2 08/23/2020   MCV 83.6 08/23/2020   PLT 86 (L) 08/23/2020   Lab Results  Component Value Date   NA 141 06/29/2020   K 4.0 06/29/2020   CO2 28 06/29/2020   GLUCOSE 81 06/29/2020   BUN 6 06/29/2020   CREATININE 0.69 06/29/2020   BILITOT 0.5 06/29/2020   ALKPHOS 55  06/29/2020   AST 12 (L) 06/29/2020   ALT 6 06/29/2020   PROT 6.4 (L) 06/29/2020   ALBUMIN  4.2 06/29/2020   CALCIUM 9.7 06/29/2020   ANIONGAP 6 06/29/2020   Lab Results  Component Value Date   CHOL 184 06/29/2020   Lab Results  Component Value Date   HDL 57 06/29/2020   Lab Results  Component Value Date   LDLCALC 120 (H) 06/29/2020   Lab Results  Component Value Date   TRIG 36 06/29/2020   Lab Results  Component Value Date   CHOLHDL 3.2 06/29/2020   No results found for: HGBA1C    Assessment & Plan:   Problem List Items Addressed This Visit     Irregular menses - Primary    Irregular menses continues Recent lab work negative for hormonal imbalance.  Discussed option of further labs and [place] vs. Starting OCP trial for three months today.  Patient would like to start OCP trial for three months.  We will begin trial today. UTP negative. Patient will contact the office if she does not start menstruating during the placebo week of the first pill pack or if she has irregular bleeding at any time while on the pill pack.  She will contact the office if she fails to start menstruating once she has completed the three month trial on OCP.  Will plan for GYN referral for further evaluation if trial is not successful.       Relevant Medications   norethindrone-ethinyl estradiol 1/35 (ORTHO-NOVUM) tablet    Meds ordered this encounter  Medications   norethindrone-ethinyl estradiol 1/35 (ORTHO-NOVUM) tablet    Sig: Take 1 tablet by mouth daily.    Dispense:  28 tablet    Refill:  11    Follow-up: Return if symptoms worsen or fail to improve.    Tollie Eth, NP

## 2021-04-12 ENCOUNTER — Encounter (HOSPITAL_BASED_OUTPATIENT_CLINIC_OR_DEPARTMENT_OTHER): Payer: Self-pay | Admitting: *Deleted

## 2021-04-12 ENCOUNTER — Other Ambulatory Visit: Payer: Self-pay

## 2021-04-12 ENCOUNTER — Inpatient Hospital Stay (HOSPITAL_COMMUNITY)
Admission: AD | Admit: 2021-04-12 | Discharge: 2021-04-12 | Disposition: A | Discharge status: Home / Self Care | Payer: Medicaid Other | Attending: Obstetrics & Gynecology | Admitting: Obstetrics & Gynecology

## 2021-04-12 ENCOUNTER — Encounter (HOSPITAL_COMMUNITY): Payer: Self-pay | Admitting: Obstetrics & Gynecology

## 2021-04-12 ENCOUNTER — Inpatient Hospital Stay (HOSPITAL_COMMUNITY): Payer: Medicaid Other

## 2021-04-12 ENCOUNTER — Ambulatory Visit (INDEPENDENT_AMBULATORY_CARE_PROVIDER_SITE_OTHER): Payer: Medicaid Other | Admitting: *Deleted

## 2021-04-12 DIAGNOSIS — O468X1 Other antepartum hemorrhage, first trimester: Secondary | ICD-10-CM | POA: Diagnosis not present

## 2021-04-12 DIAGNOSIS — Z6740 Type O blood, Rh positive: Secondary | ICD-10-CM | POA: Insufficient documentation

## 2021-04-12 DIAGNOSIS — O209 Hemorrhage in early pregnancy, unspecified: Secondary | ICD-10-CM

## 2021-04-12 DIAGNOSIS — Z3201 Encounter for pregnancy test, result positive: Secondary | ICD-10-CM | POA: Diagnosis not present

## 2021-04-12 DIAGNOSIS — O26851 Spotting complicating pregnancy, first trimester: Secondary | ICD-10-CM | POA: Insufficient documentation

## 2021-04-12 DIAGNOSIS — O418X10 Other specified disorders of amniotic fluid and membranes, first trimester, not applicable or unspecified: Secondary | ICD-10-CM | POA: Insufficient documentation

## 2021-04-12 DIAGNOSIS — Z3A01 Less than 8 weeks gestation of pregnancy: Secondary | ICD-10-CM

## 2021-04-12 DIAGNOSIS — N926 Irregular menstruation, unspecified: Secondary | ICD-10-CM

## 2021-04-12 LAB — URINALYSIS, ROUTINE W REFLEX MICROSCOPIC
Bilirubin Urine: NEGATIVE
Glucose, UA: NEGATIVE mg/dL
Hgb urine dipstick: NEGATIVE
Ketones, ur: NEGATIVE mg/dL
Leukocytes,Ua: NEGATIVE
Nitrite: NEGATIVE
Protein, ur: NEGATIVE mg/dL
Specific Gravity, Urine: 1.025 (ref 1.005–1.030)
pH: 9 — ABNORMAL HIGH (ref 5.0–8.0)

## 2021-04-12 LAB — WET PREP, GENITAL
Sperm: NONE SEEN
Trich, Wet Prep: NONE SEEN
WBC, Wet Prep HPF POC: <10 (ref <10)
Yeast Wet Prep HPF POC: NONE SEEN

## 2021-04-12 LAB — CBC
HCT: 37.9 % (ref 36.0–46.0)
Hemoglobin: 12.7 g/dL (ref 12.0–15.0)
MCH: 27.9 pg (ref 26.0–34.0)
MCHC: 33.5 g/dL (ref 30.0–36.0)
MCV: 83.3 fL (ref 80.0–100.0)
Platelets: 103 10*3/uL — ABNORMAL LOW (ref 150–400)
RBC: 4.55 MIL/uL (ref 3.87–5.11)
RDW: 13.2 % (ref 11.5–15.5)
WBC: 8.6 10*3/uL (ref 4.0–10.5)
nRBC: 0 % (ref 0.0–0.2)

## 2021-04-12 LAB — POCT URINE PREGNANCY: Preg Test, Ur: POSITIVE — AB

## 2021-04-12 LAB — HCG, QUANTITATIVE, PREGNANCY: hCG, Beta Chain, Quant, S: 64840 m[IU]/mL — ABNORMAL HIGH (ref <5)

## 2021-04-12 MED ORDER — PRENATAL 27-0.8 MG PO TABS: 1.0000 | ORAL_TABLET | Freq: Every day | ORAL | 0 refills | Status: DC

## 2021-04-12 NOTE — MAU Provider Note (Signed)
History     CSN: [id number]  Arrival date and time: 04/12/21 1710   Event Date/Time   First Provider Initiated Contact with Patient 04/12/21 1745      Chief Complaint  Patient presents with   Vaginal Bleeding   Ruth Solomon is a 21 y.o. G3P1011 at [redacted]w[redacted]d who presents today with vaginal bleeding. She was in the office today for a pregnancy test and had no concerns at that time. She states that bleeding started after that appointment.   Vaginal Bleeding The patient's primary symptoms include vaginal bleeding. This is a new problem. The current episode started today (around 12:00). The patient is experiencing no pain. She is pregnant. The vaginal discharge was bloody. The vaginal bleeding is lighter than menses. She has not been passing clots. She has not been passing tissue. Nothing aggravates the symptoms. She has tried nothing for the symptoms. Sexual activity: denies intercourse in the 24 hours. Menstrual history: LMP 03/02/2021.   OB History     Gravida  3   Para  1   Term  1   Preterm      AB  1   Living  1      SAB      IAB  1   Ectopic      Multiple  0   Live Births  1           Past Medical History:  Diagnosis Date   Benign gestational thrombocytopenia in third trimester ([place]) 04/10/2019   [x ] f/u heme referral: done 2/24: [facility] heme onc labs. Will see 6wks PP  04/10/19: verified by microscope  CBC Latest Ref Rng & Units 04/10/2019 03/25/2019 12/06/2018  WBC 3.4 - 10.8 x10E3/uL 10.1 - -  Hemoglobin 11.1 - 15.9 g/dL 11.6 11.9 12.2  Hematocrit 34.0 - 46.6 % 33.9(L) 37 37  Platelets 150 - 450 x10E3/uL 92(LL) - 103      Delivery of pregnancy by cesarean section 06/01/2019   Laboratory examination ordered as part of a routine general medical examination 06/29/2020   Postpartum anemia 06/01/2019   Thrombocytopenia ([place])     Past Surgical History:  Procedure Laterality Date   CESAREAN SECTION N/A 05/30/2019   Procedure: CESAREAN SECTION;  Surgeon: Woodroe Mode, MD;   Location: [facility];  Service: Obstetrics;  Laterality: N/A;   FOREIGN BODY REMOVAL Left    sewing needle removed from big toe    Family History  Problem Relation Age of Onset   Healthy Mother    Healthy Father     Social History   Tobacco Use   Smoking status: Never   Smokeless tobacco: Never  Vaping Use   Vaping Use: Never used  Substance Use Topics   Alcohol use: Never   Drug use: Not Currently    Types: Marijuana    Comment: last use a few months ago    Allergies: No Known Allergies  No medications prior to admission.    Review of Systems  Genitourinary:  Positive for vaginal bleeding.  All other systems reviewed and are negative. Physical Exam   Blood pressure 115/60, pulse 80, temperature 98.5 F (36.9 C), temperature source Oral, resp. rate 16, last menstrual period 03/02/2021, SpO2 100 %, not currently breastfeeding.  Physical Exam Constitutional:      Appearance: She is well-developed.  HENT:     Head: Normocephalic.  Eyes:     Pupils: Pupils are equal, round, and reactive to light.  Cardiovascular:  Rate and Rhythm: Normal rate and regular rhythm.     Heart sounds: Normal heart sounds.  Pulmonary:     Effort: Pulmonary effort is normal. No respiratory distress.     Breath sounds: Normal breath sounds.  Abdominal:     Palpations: Abdomen is soft.     Tenderness: There is no abdominal tenderness.  Genitourinary:    Vagina: No bleeding. Vaginal discharge: mucusy.    Comments: External: no lesion Vagina: small amount of white discharge     Musculoskeletal:        General: Normal range of motion.     Cervical back: Normal range of motion and neck supple.  Skin:    General: Skin is warm and dry.  Neurological:     Mental Status: She is alert and oriented to person, place, and time.  Psychiatric:        Mood and Affect: Mood normal.        Behavior: Behavior normal.   Results for orders placed or performed during the hospital encounter of  04/12/21 (from the past 24 hour(s))  CBC     Status: Abnormal   Collection Time: 04/12/21  5:43 PM  Result Value Ref Range   WBC 8.6 4.0 - 10.5 K/uL   RBC 4.55 3.87 - 5.11 MIL/uL   Hemoglobin 12.7 12.0 - 15.0 g/dL   HCT 37.9 36.0 - 46.0 %   MCV 83.3 80.0 - 100.0 fL   MCH 27.9 26.0 - 34.0 pg   MCHC 33.5 30.0 - 36.0 g/dL   RDW 13.2 11.5 - 15.5 %   Platelets 103 (L) 150 - 400 K/uL   nRBC 0.0 0.0 - 0.2 %  hCG, quantitative, pregnancy     Status: Abnormal   Collection Time: 04/12/21  5:43 PM  Result Value Ref Range   hCG, Beta Chain, Quant, S 64,840 (H) <5 mIU/mL  Wet prep, genital     Status: Abnormal   Collection Time: 04/12/21  5:48 PM   Specimen: Vaginal  Result Value Ref Range   Yeast Wet Prep HPF POC NONE SEEN NONE SEEN   Trich, Wet Prep NONE SEEN NONE SEEN   Clue Cells Wet Prep HPF POC PRESENT (A) NONE SEEN   WBC, Wet Prep HPF POC <10 <10   Sperm NONE SEEN   Urinalysis, Routine w reflex microscopic Urine, Clean Catch     Status: Abnormal   Collection Time: 04/12/21  6:14 PM  Result Value Ref Range   Color, Urine YELLOW YELLOW   APPearance CLOUDY (A) CLEAR   Specific Gravity, Urine 1.025 1.005 - 1.030   pH 9.0 (H) 5.0 - 8.0   Glucose, UA NEGATIVE NEGATIVE mg/dL   Hgb urine dipstick NEGATIVE NEGATIVE   Bilirubin Urine NEGATIVE NEGATIVE   Ketones, ur NEGATIVE NEGATIVE mg/dL   Protein, ur NEGATIVE NEGATIVE mg/dL   Nitrite NEGATIVE NEGATIVE   Leukocytes,Ua NEGATIVE NEGATIVE   US OB LESS THAN 14 WEEKS WITH OB TRANSVAGINAL  Result Date: 04/12/2021 CLINICAL DATA:  Vaginal spotting EXAM: OBSTETRIC <14 WK [place] AND TRANSVAGINAL OB US TECHNIQUE: Both transabdominal and transvaginal ultrasound examinations were performed for complete evaluation of the gestation as well as the maternal uterus, adnexal regions, and pelvic cul-de-sac. Transvaginal technique was performed to assess early pregnancy. COMPARISON:  None. FINDINGS: Intrauterine gestational sac: Single Yolk sac:  Visualized.  Embryo:  Visualized. Cardiac Activity: Visualized. Heart Rate: 124 bpm CRL:  5.7 mm   6 w   2 d  [place] EDC: 10 sac 23 Subchorionic hemorrhage: Moderate subchronic hemorrhage along the inferior and right lateral aspects of the gestational sac. Maternal uterus/adnexae: Right ovary measures 2.4 x 1.5 x 1.6 cm and the left ovary measures 4.1 x 2.2 x 2.4 cm. IMPRESSION: 1. Single live intrauterine pregnancy as above, estimated age 23 weeks and 2 days. 2. Moderate subchorionic hemorrhage. Electronically Signed   By: Randa Ngo M.D.   On: 04/12/2021 18:16    MAU Course  Procedures  MDM   Assessment and Plan   1. Subchorionic hematoma in first trimester, single or unspecified fetus   2. Vaginal bleeding in pregnancy, first trimester   3. [redacted] weeks gestation of pregnancy   4. Type O blood, Rh positive    DC home 1st Trimester precautions  Bleeding precautions RX: none  Return to MAU as needed FU with OB as planned   Follow-up Information     [place] [facility] Follow up.   Contact information: [place] [phone]               Airi Copado DNP, CNM  04/12/21  9:24 PM

## 2021-04-12 NOTE — Progress Notes (Signed)
Pt here for confirmation of pregnancy. Test positive. Denies cramping, vaginal bleeding, discharge. New OB appt given. Advised to call with any concerns

## 2021-04-12 NOTE — MAU Note (Signed)
Ruth Solomon is a 21 y.o. at [redacted]w[redacted]d here in MAU reporting: states after she got home from the office she started to have some bleeding. States it is light and was wearing a panty liner. No clots. No pain.  Onset of complaint: today  Pain score: 0/10  Vitals:   04/12/21 1725  BP: 109/76  Pulse: 92  Resp: 16  Temp: 98.5 F (36.9 C)  SpO2: 100%     Lab orders placed from triage: UA

## 2021-04-13 LAB — GC/CHLAMYDIA PROBE AMP (~~LOC~~) NOT AT ARMC
Chlamydia: POSITIVE — AB
Comment: NEGATIVE
Comment: NORMAL
Neisseria Gonorrhea: NEGATIVE

## 2021-04-14 ENCOUNTER — Telehealth (HOSPITAL_COMMUNITY): Payer: Self-pay

## 2021-04-14 MED ORDER — AZITHROMYCIN 500 MG PO TABS: 1000.0000 mg | ORAL_TABLET | Freq: Once | ORAL | 0 refills | Status: AC

## 2021-04-15 ENCOUNTER — Ambulatory Visit (HOSPITAL_COMMUNITY)
Admission: EM | Admit: 2021-04-15 | Discharge: 2021-04-15 | Disposition: A | Discharge status: Home / Self Care | Payer: Medicaid Other | Attending: Student | Admitting: Student

## 2021-04-15 DIAGNOSIS — A749 Chlamydial infection, unspecified: Secondary | ICD-10-CM | POA: Diagnosis not present

## 2021-04-15 DIAGNOSIS — O98811 Other maternal infectious and parasitic diseases complicating pregnancy, first trimester: Secondary | ICD-10-CM | POA: Diagnosis present

## 2021-04-15 DIAGNOSIS — Z113 Encounter for screening for infections with a predominantly sexual mode of transmission: Secondary | ICD-10-CM | POA: Diagnosis present

## 2021-04-15 MED ORDER — AZITHROMYCIN 250 MG PO TABS
ORAL_TABLET | ORAL | Status: AC
  Filled 2021-04-15: qty 4

## 2021-04-15 MED ORDER — AZITHROMYCIN 250 MG PO TABS
1000.0000 mg | ORAL_TABLET | Freq: Once | ORAL | Status: AC
  Administered 2021-04-15: 1000 mg via ORAL

## 2021-04-15 NOTE — ED Triage Notes (Signed)
Pt is here to be retested for chlamydia, She was dx with chlamydia on 04/12/2021. She does not believe she has chlamydia and did not complete the course of medication that was prescribed.She reports no symptoms.

## 2021-04-15 NOTE — ED Provider Notes (Signed)
[name]    CSN: [id number] Arrival date & time: 04/15/21  1650      History   Chief Complaint Chief Complaint  Patient presents with   Exposure to STD    HPI Ruth Solomon is a 21 y.o. female presenting with chlamydia.  Medical history currently pregnant.  States that she tested positive for chlamydia at her GYN office 1 week ago, they sent in azithromycin, but she did not take this as she does not believe that the test is accurate.  Denies any symptoms. Denies hematuria, dysuria, frequency, urgency, back pain, n/v/d/abd pain, fevers/chills, abdnormal vaginal discharge, vaginal rashes/lesions, vaginal spotting. Currently sexually active with same female partner.   HPI  Past Medical History:  Diagnosis Date   Benign gestational thrombocytopenia in third trimester ([place]) 04/10/2019   [x ] f/u heme referral: done 2/24: [facility] heme onc labs. Will see 6wks PP  04/10/19: verified by microscope  CBC Latest Ref Rng & Units 04/10/2019 03/25/2019 12/06/2018  WBC 3.4 - 10.8 x10E3/uL 10.1 - -  Hemoglobin 11.1 - 15.9 g/dL 11.6 11.9 12.2  Hematocrit 34.0 - 46.6 % 33.9(L) 37 37  Platelets 150 - 450 x10E3/uL 92(LL) - 103      Delivery of pregnancy by cesarean section 06/01/2019   Laboratory examination ordered as part of a routine general medical examination 06/29/2020   Postpartum anemia 06/01/2019   Thrombocytopenia ([place])     Patient Active Problem List   Diagnosis Date Noted   Irregular menses 06/29/2020   Thrombocytopenia ([place]) 05/30/2019    Past Surgical History:  Procedure Laterality Date   CESAREAN SECTION N/A 05/30/2019   Procedure: CESAREAN SECTION;  Surgeon: Woodroe Mode, MD;  Location: [facility];  Service: Obstetrics;  Laterality: N/A;   FOREIGN BODY REMOVAL Left    sewing needle removed from big toe    OB History     Gravida  3   Para  1   Term  1   Preterm      AB  1   Living  1      SAB      IAB  1   Ectopic      Multiple  0   Live Births  1             Home Medications    Prior to Admission medications   Medication Sig Start Date End Date Taking? Authorizing Provider  Prenatal Vit-Fe Fumarate-FA (MULTIVITAMIN-PRENATAL) 27-0.8 MG TABS tablet Take 1 tablet by mouth daily at 12 noon. 04/12/21   Megan Salon, MD    Family History Family History  Problem Relation Age of Onset   Healthy Mother    Healthy Father     Social History Social History   Tobacco Use   Smoking status: Never   Smokeless tobacco: Never  Vaping Use   Vaping Use: Never used  Substance Use Topics   Alcohol use: Never   Drug use: Not Currently    Types: Marijuana    Comment: last use a few months ago     Allergies   Patient has no known allergies.   Review of Systems Review of Systems  Constitutional:  Negative for chills and fever.  HENT:  Negative for sore throat.   Eyes:  Negative for pain and redness.  Respiratory:  Negative for shortness of breath.   Cardiovascular:  Negative for chest pain.  Gastrointestinal:  Negative for abdominal pain, diarrhea, nausea and vomiting.  Genitourinary:  Negative for decreased urine volume, difficulty urinating, dysuria, flank pain, frequency, genital sores, hematuria and urgency.  Musculoskeletal:  Negative for back pain.  Skin:  Negative for rash.  All other systems reviewed and are negative.   Physical Exam Triage Vital Signs ED Triage Vitals  Enc Vitals Group     BP 04/15/21 1801 101/69     Pulse Rate 04/15/21 1801 82     Resp 04/15/21 1801 16     Temp 04/15/21 1801 98.4 F (36.9 C)     Temp Source 04/15/21 1801 Oral     SpO2 04/15/21 1801 100 %     Weight --      Height --      Head Circumference --      Peak Flow --      Pain Score 04/15/21 1832 0     Pain Loc --      Pain Edu? --      Excl. in [place]? --    No data found.  Updated Vital Signs BP 101/69 (BP Location: Left Arm)    Pulse 82    Temp 98.4 F (36.9 C) (Oral)    Resp 16    LMP 03/02/2021 (Exact Date)    SpO2 100%    Visual Acuity Right Eye Distance:   Left Eye Distance:   Bilateral Distance:    Right Eye Near:   Left Eye Near:    Bilateral Near:     Physical Exam Vitals reviewed.  Constitutional:      General: She is not in acute distress.    Appearance: Normal appearance. She is not ill-appearing.  HENT:     Head: Normocephalic and atraumatic.     Mouth/Throat:     Mouth: Mucous membranes are moist.     Comments: Moist mucous membranes Eyes:     Extraocular Movements: Extraocular movements intact.     Pupils: Pupils are equal, round, and reactive to light.  Cardiovascular:     Rate and Rhythm: Normal rate and regular rhythm.     Heart sounds: Normal heart sounds.  Pulmonary:     Effort: Pulmonary effort is normal.     Breath sounds: Normal breath sounds. No wheezing, rhonchi or rales.  Abdominal:     General: Bowel sounds are normal. There is no distension.     Palpations: Abdomen is soft. There is no mass.     Tenderness: There is no abdominal tenderness. There is no right CVA tenderness, left CVA tenderness, guarding or rebound.  Genitourinary:    Comments: deferred Skin:    General: Skin is warm.     Capillary Refill: Capillary refill takes less than 2 seconds.     Comments: Good skin turgor  Neurological:     General: No focal deficit present.     Mental Status: She is alert and oriented to person, place, and time.  Psychiatric:        Mood and Affect: Mood normal.        Behavior: Behavior normal.     UC Treatments / Results  Labs (all labs ordered are listed, but only abnormal results are displayed) Labs Reviewed  CERVICOVAGINAL ANCILLARY ONLY    EKG   Radiology No results found.  Procedures Procedures (including critical care time)  Medications Ordered in UC Medications  azithromycin (ZITHROMAX) tablet 1,000 mg (1,000 mg Oral Given 04/15/21 1828)    Initial Impression / Assessment and Plan / UC Course  I have reviewed the triage vital  signs and the  nursing notes.  Pertinent labs & imaging results that were available during my care of the patient were reviewed by me and considered in my medical decision making (see chart for details).     This patient is a very pleasant 21 y.o. year old female presenting with chlamydia. She is about [redacted] weeks pregnant. No abd pain, flank pain, fevers/chills; she is currently asymptomatic.   I personally reviewed labs from OB/GYN, and showed patient that she is indeed positive for chlamydia.  She is still requesting a retest.  I am amenable to this, but emphasized the importance of azithromycin for management of this, given the risk of miscarriage to baby.  Patient is amenable, and we administered 1 g of azithromycin during visit.  ED return precautions discussed. Patient verbalizes understanding and agreement.   Coding Level 4 for review of past notes/labs, order and interpretation of labs today, and prescription drug management    Final Clinical Impressions(s) / UC Diagnoses   Final diagnoses:  Chlamydia  Routine screening for STI (sexually transmitted infection)  Chlamydia infection affecting pregnancy in first trimester     Discharge Instructions      -Abstain from intercourse x7 days to ensure resolution  -Follow-up with OB if new symptoms: abdominal pain, vaginal spotting, new fevers     ED Prescriptions   None    PDMP not reviewed this encounter.   Hazel Sams, PA-C 04/15/21 1856

## 2021-04-15 NOTE — Discharge Instructions (Addendum)
-  Abstain from intercourse x7 days to ensure resolution  -Follow-up with OB if new symptoms: abdominal pain, vaginal spotting, new fevers

## 2021-04-16 ENCOUNTER — Telehealth (HOSPITAL_COMMUNITY): Payer: Self-pay | Admitting: Emergency Medicine

## 2021-04-16 LAB — CERVICOVAGINAL ANCILLARY ONLY
Bacterial Vaginitis (gardnerella): POSITIVE — AB
Candida Glabrata: NEGATIVE
Candida Vaginitis: NEGATIVE
Chlamydia: POSITIVE — AB
Comment: NEGATIVE
Comment: NEGATIVE
Comment: NEGATIVE
Comment: NEGATIVE
Comment: NEGATIVE
Comment: NORMAL
Neisseria Gonorrhea: NEGATIVE
Trichomonas: NEGATIVE

## 2021-04-16 MED ORDER — METRONIDAZOLE 0.75 % VA GEL: 1.0000 | Freq: Every day | VAGINAL | 0 refills | Status: AC

## 2021-04-19 ENCOUNTER — Ambulatory Visit (HOSPITAL_BASED_OUTPATIENT_CLINIC_OR_DEPARTMENT_OTHER): Payer: Medicaid Other | Admitting: Nurse Practitioner

## 2021-04-21 ENCOUNTER — Encounter (HOSPITAL_COMMUNITY): Payer: Self-pay | Admitting: Obstetrics & Gynecology

## 2021-04-21 ENCOUNTER — Inpatient Hospital Stay (HOSPITAL_COMMUNITY): Payer: Medicaid Other

## 2021-04-21 ENCOUNTER — Inpatient Hospital Stay (HOSPITAL_COMMUNITY)
Admission: AD | Admit: 2021-04-21 | Discharge: 2021-04-21 | Disposition: A | Discharge status: Home / Self Care | Payer: Medicaid Other | Attending: Obstetrics & Gynecology | Admitting: Obstetrics & Gynecology

## 2021-04-21 DIAGNOSIS — E876 Hypokalemia: Secondary | ICD-10-CM

## 2021-04-21 DIAGNOSIS — O209 Hemorrhage in early pregnancy, unspecified: Secondary | ICD-10-CM | POA: Diagnosis not present

## 2021-04-21 DIAGNOSIS — O210 Mild hyperemesis gravidarum: Secondary | ICD-10-CM

## 2021-04-21 DIAGNOSIS — Z3A01 Less than 8 weeks gestation of pregnancy: Secondary | ICD-10-CM | POA: Diagnosis not present

## 2021-04-21 DIAGNOSIS — N939 Abnormal uterine and vaginal bleeding, unspecified: Secondary | ICD-10-CM

## 2021-04-21 DIAGNOSIS — O211 Hyperemesis gravidarum with metabolic disturbance: Secondary | ICD-10-CM | POA: Insufficient documentation

## 2021-04-21 LAB — URINALYSIS, ROUTINE W REFLEX MICROSCOPIC
Bilirubin Urine: NEGATIVE
Glucose, UA: NEGATIVE mg/dL
Ketones, ur: 80 mg/dL — AB
Leukocytes,Ua: NEGATIVE
Nitrite: NEGATIVE
Protein, ur: 100 mg/dL — AB
RBC / HPF: >50 RBC/hpf — ABNORMAL HIGH (ref 0–5)
Specific Gravity, Urine: 1.034 — ABNORMAL HIGH (ref 1.005–1.030)
pH: 5 (ref 5.0–8.0)

## 2021-04-21 LAB — CBC
HCT: 45 % (ref 36.0–46.0)
Hemoglobin: 14.8 g/dL (ref 12.0–15.0)
MCH: 26.8 pg (ref 26.0–34.0)
MCHC: 32.9 g/dL (ref 30.0–36.0)
MCV: 81.5 fL (ref 80.0–100.0)
Platelets: 133 10*3/uL — ABNORMAL LOW (ref 150–400)
RBC: 5.52 MIL/uL — ABNORMAL HIGH (ref 3.87–5.11)
RDW: 12.6 % (ref 11.5–15.5)
WBC: 12.8 10*3/uL — ABNORMAL HIGH (ref 4.0–10.5)
nRBC: 0 % (ref 0.0–0.2)

## 2021-04-21 LAB — COMPREHENSIVE METABOLIC PANEL
ALT: 14 U/L (ref 0–44)
AST: 17 U/L (ref 15–41)
Albumin: 3.7 g/dL (ref 3.5–5.0)
Alkaline Phosphatase: 59 U/L (ref 38–126)
Anion gap: 13 (ref 5–15)
BUN: 12 mg/dL (ref 6–20)
CO2: 26 mmol/L (ref 22–32)
Calcium: 9.5 mg/dL (ref 8.9–10.3)
Chloride: 97 mmol/L — ABNORMAL LOW (ref 98–111)
Creatinine, Ser: 0.76 mg/dL (ref 0.44–1.00)
GFR, Estimated: >60 mL/min (ref >60)
Glucose, Bld: 71 mg/dL (ref 70–99)
Potassium: 2.8 mmol/L — ABNORMAL LOW (ref 3.5–5.1)
Sodium: 136 mmol/L (ref 135–145)
Total Bilirubin: 0.9 mg/dL (ref 0.3–1.2)
Total Protein: 7.1 g/dL (ref 6.5–8.1)

## 2021-04-21 LAB — MAGNESIUM: Magnesium: 1.8 mg/dL (ref 1.7–2.4)

## 2021-04-21 MED ORDER — ONDANSETRON 4 MG PO TBDP
8.0000 mg | ORAL_TABLET | Freq: Once | ORAL | Status: AC
  Administered 2021-04-21: 8 mg via ORAL
  Filled 2021-04-21: qty 2

## 2021-04-21 MED ORDER — GLYCOPYRROLATE 1 MG PO TABS: 1.0000 mg | ORAL_TABLET | Freq: Three times a day (TID) | ORAL | 1 refills | Status: DC

## 2021-04-21 MED ORDER — POTASSIUM CHLORIDE CRYS ER 20 MEQ PO TBCR
40.0000 meq | EXTENDED_RELEASE_TABLET | Freq: Every day | ORAL | 0 refills | Status: DC
Start: 2021-04-21 — End: 2021-05-12

## 2021-04-21 MED ORDER — PROMETHAZINE HCL 25 MG PO TABS: 25.0000 mg | ORAL_TABLET | Freq: Four times a day (QID) | ORAL | 0 refills | Status: DC | PRN

## 2021-04-21 MED ORDER — POTASSIUM CHLORIDE CRYS ER 20 MEQ PO TBCR: 20.0000 meq | EXTENDED_RELEASE_TABLET | Freq: Every day | ORAL | 0 refills | Status: DC

## 2021-04-21 MED ORDER — METOCLOPRAMIDE HCL 5 MG/ML IJ SOLN
10.0000 mg | Freq: Once | INTRAMUSCULAR | Status: DC
  Filled 2021-04-21: qty 2

## 2021-04-21 MED ORDER — LACTATED RINGERS IV BOLUS
1000.0000 mL | Freq: Once | INTRAVENOUS | Status: AC
Start: 2021-04-21 — End: 2021-04-21
  Administered 2021-04-21: 1000 mL via INTRAVENOUS

## 2021-04-21 MED ORDER — ONDANSETRON 8 MG PO TBDP
8.0000 mg | ORAL_TABLET | Freq: Three times a day (TID) | ORAL | 1 refills | Status: DC | PRN
Start: 2021-04-21 — End: 2021-05-12

## 2021-04-21 MED ORDER — POTASSIUM CHLORIDE CRYS ER 20 MEQ PO TBCR
40.0000 meq | EXTENDED_RELEASE_TABLET | Freq: Once | ORAL | Status: AC
  Administered 2021-04-21: 40 meq via ORAL
  Filled 2021-04-21: qty 2

## 2021-04-21 NOTE — MAU Provider Note (Addendum)
Patient Ruth Solomon is a 21 y.o. G3P1011  At [redacted]w[redacted]d here with complaints of nausea, vomiting and vaginal bleeding. She denies fever, SOB, chest pain. She denies constipation and diarrhea. She denies dysuria. She started vomiting yesterday and it was "all day". She does not have any medicine at home. She has tried eating "everything" at home but has not found anything that has worked for her.   She was seen in MAU on 04/12/2021 and had a IUP with cardiac activity. She was diagnosed with a moderate subchorionic hemorrhage.   She denies any issue with blood pressure issue, diabetes. She endorses frequent spitting.  History     CSN: [id number]  Arrival date and time: 04/21/21 1449   Event Date/Time   First Provider Initiated Contact with Patient 04/21/21 1615      Chief Complaint  Patient presents with   Vaginal Bleeding   Emesis   Vaginal Bleeding The patient's primary symptoms include vaginal bleeding. This is a chronic problem. The current episode started in the past 7 days. Associated symptoms include vomiting. Pertinent negatives include no abdominal pain. The vaginal discharge was bloody. The vaginal bleeding is typical of menses. She has not been passing clots. She has not been passing tissue.  Emesis  This is a new problem. The current episode started yesterday. The problem occurs 5 to 10 times per day. The problem has been gradually worsening. The emesis has an appearance of bile and stomach contents. There has been no fever. Pertinent negatives include no abdominal pain.   OB History     Gravida  3   Para  1   Term  1   Preterm      AB  1   Living  1      SAB      IAB  1   Ectopic      Multiple  0   Live Births  1           Past Medical History:  Diagnosis Date   Benign gestational thrombocytopenia in third trimester ([place]) 04/10/2019   [x ] f/u heme referral: done 2/24: fu heme onc labs. Will see 6wks PP  04/10/19: verified by microscope  CBC Latest Ref  Rng & Units 04/10/2019 03/25/2019 12/06/2018  WBC 3.4 - 10.8 x10E3/uL 10.1 - -  Hemoglobin 11.1 - 15.9 g/dL 11.6 11.9 12.2  Hematocrit 34.0 - 46.6 % 33.9(L) 37 37  Platelets 150 - 450 x10E3/uL 92(LL) - 103      Delivery of pregnancy by cesarean section 06/01/2019   Laboratory examination ordered as part of a routine general medical examination 06/29/2020   Postpartum anemia 06/01/2019   Thrombocytopenia ([place])     Past Surgical History:  Procedure Laterality Date   CESAREAN SECTION N/A 05/30/2019   Procedure: CESAREAN SECTION;  Surgeon: Woodroe Mode, MD;  [facility]: [facility];  Service: Obstetrics;  Laterality: N/A;   FOREIGN BODY REMOVAL Left    sewing needle removed from big toe    Family History  Problem Relation Age of Onset   Healthy Mother    Healthy Father     Social History   Tobacco Use   Smoking status: Never   Smokeless tobacco: Never  Vaping Use   Vaping Use: Never used  Substance Use Topics   Alcohol use: Never   Drug use: Not Currently    Types: Marijuana    Comment: last use a few months ago    Allergies: No Known Allergies  No medications prior to admission.    Review of Systems  Constitutional: Negative.   HENT: Negative.    Respiratory: Negative.    Cardiovascular: Negative.   Gastrointestinal:  Positive for vomiting. Negative for abdominal pain.  Genitourinary:  Positive for vaginal bleeding.  Musculoskeletal: Negative.   Neurological: Negative.   Psychiatric/Behavioral: Negative.    Physical Exam   Blood pressure 114/63, pulse (!) 105, temperature 98.3 F (36.8 C), temperature source Oral, resp. rate 18, height 5\' 2"  (1.575 m), weight 56.3 kg, last menstrual period 03/02/2021, SpO2 99 %, not currently breastfeeding.  Physical Exam Constitutional:      Appearance: Normal appearance.  Pulmonary:     Effort: Pulmonary effort is normal.  Abdominal:     General: Abdomen is flat.  Neurological:     General: No focal deficit present.     Mental  Status: She is alert.  Psychiatric:        Mood and Affect: Mood normal.        Behavior: Behavior normal.  Speculum exam not done  MAU Course  Procedures  MDM -US shows viable IUP -UA shows 80 ketones> patient received IV fluids and zofran/reglan and tolerated PO CBC > slightly elevated white count -CMP shows Potassium of 2.8. Patient had oral 40 meq of potassium in MAU; she tolerated well   Assessment and Plan   1. Morning sickness   2. Vaginal bleeding   3. [redacted] weeks gestation of pregnancy   4. Hypokalemia   -Rx given for K-dur, phenergan, zofran, robinul -discussed eating habits and how to take medicine, stressed importance of keeping on top of medicines otherwise nausea and vomiting may return and become intractable -discussed potassium with Dr. Harolyn Rutherford, will send home with RX rather than do IV K before discharge; also discussed that we will defer EKG due to patient otherwise feeling well -discussed importance of taking Potassium supplements as prescribed -patient is reassured by the presence of cardiac activity, has had no bleeding while in MAU.   Mervyn Skeeters Yohanna Tow 04/22/2021, 10:23 AM

## 2021-04-21 NOTE — MAU Note (Addendum)
Is really dehydrated and keeps throwing up everything. Is having some bleeding.  (Known subchorionic hemorrhage)  somedays is heavier than others, so is kind of scary.  Has a lot of excess saliva. No pain today.  No meds

## 2021-04-25 ENCOUNTER — Other Ambulatory Visit: Payer: Self-pay

## 2021-04-25 ENCOUNTER — Inpatient Hospital Stay (HOSPITAL_COMMUNITY)
Admission: AD | Admit: 2021-04-25 | Discharge: 2021-04-25 | Disposition: A | Discharge status: Home / Self Care | Payer: Medicaid Other | Attending: Obstetrics & Gynecology | Admitting: Obstetrics & Gynecology

## 2021-04-25 ENCOUNTER — Encounter (HOSPITAL_COMMUNITY): Payer: Self-pay | Admitting: Obstetrics & Gynecology

## 2021-04-25 DIAGNOSIS — K117 Disturbances of salivary secretion: Secondary | ICD-10-CM | POA: Diagnosis not present

## 2021-04-25 DIAGNOSIS — R748 Abnormal levels of other serum enzymes: Secondary | ICD-10-CM | POA: Insufficient documentation

## 2021-04-25 DIAGNOSIS — O99612 Diseases of the digestive system complicating pregnancy, second trimester: Secondary | ICD-10-CM | POA: Diagnosis not present

## 2021-04-25 DIAGNOSIS — O210 Mild hyperemesis gravidarum: Secondary | ICD-10-CM | POA: Insufficient documentation

## 2021-04-25 DIAGNOSIS — O99281 Endocrine, nutritional and metabolic diseases complicating pregnancy, first trimester: Secondary | ICD-10-CM | POA: Insufficient documentation

## 2021-04-25 DIAGNOSIS — E860 Dehydration: Secondary | ICD-10-CM | POA: Insufficient documentation

## 2021-04-25 DIAGNOSIS — O26891 Other specified pregnancy related conditions, first trimester: Secondary | ICD-10-CM | POA: Diagnosis not present

## 2021-04-25 DIAGNOSIS — Z3A01 Less than 8 weeks gestation of pregnancy: Secondary | ICD-10-CM | POA: Diagnosis not present

## 2021-04-25 LAB — URINALYSIS, ROUTINE W REFLEX MICROSCOPIC
Bacteria, UA: NONE SEEN
Glucose, UA: NEGATIVE mg/dL
Ketones, ur: 80 mg/dL — AB
Leukocytes,Ua: NEGATIVE
Nitrite: NEGATIVE
Protein, ur: 100 mg/dL — AB
Specific Gravity, Urine: 1.03 (ref 1.005–1.030)
pH: 5 (ref 5.0–8.0)

## 2021-04-25 LAB — COMPREHENSIVE METABOLIC PANEL
ALT: 163 U/L — ABNORMAL HIGH (ref 0–44)
AST: 103 U/L — ABNORMAL HIGH (ref 15–41)
Albumin: 4 g/dL (ref 3.5–5.0)
Alkaline Phosphatase: 93 U/L (ref 38–126)
Anion gap: 17 — ABNORMAL HIGH (ref 5–15)
BUN: 14 mg/dL (ref 6–20)
CO2: 26 mmol/L (ref 22–32)
Calcium: 10.2 mg/dL (ref 8.9–10.3)
Chloride: 94 mmol/L — ABNORMAL LOW (ref 98–111)
Creatinine, Ser: 0.75 mg/dL (ref 0.44–1.00)
GFR, Estimated: >60 mL/min (ref >60)
Glucose, Bld: 99 mg/dL (ref 70–99)
Potassium: 3.2 mmol/L — ABNORMAL LOW (ref 3.5–5.1)
Sodium: 137 mmol/L (ref 135–145)
Total Bilirubin: 2.7 mg/dL — ABNORMAL HIGH (ref 0.3–1.2)
Total Protein: 8.1 g/dL (ref 6.5–8.1)

## 2021-04-25 LAB — CBC
HCT: 44.4 % (ref 36.0–46.0)
Hemoglobin: 15.4 g/dL — ABNORMAL HIGH (ref 12.0–15.0)
MCH: 27.2 pg (ref 26.0–34.0)
MCHC: 34.7 g/dL (ref 30.0–36.0)
MCV: 78.3 fL — ABNORMAL LOW (ref 80.0–100.0)
Platelets: 132 10*3/uL — ABNORMAL LOW (ref 150–400)
RBC: 5.67 MIL/uL — ABNORMAL HIGH (ref 3.87–5.11)
RDW: 12.5 % (ref 11.5–15.5)
WBC: 13.6 10*3/uL — ABNORMAL HIGH (ref 4.0–10.5)
nRBC: 0 % (ref 0.0–0.2)

## 2021-04-25 MED ORDER — GLYCOPYRROLATE 0.2 MG/ML IJ SOLN
0.1000 mg | Freq: Once | INTRAMUSCULAR | Status: AC
  Administered 2021-04-25: 0.1 mg via INTRAVENOUS
  Filled 2021-04-25: qty 1

## 2021-04-25 MED ORDER — LACTATED RINGERS IV BOLUS
1000.0000 mL | Freq: Once | INTRAVENOUS | Status: AC
Start: 2021-04-25 — End: 2021-04-25
  Administered 2021-04-25: 1000 mL via INTRAVENOUS

## 2021-04-25 MED ORDER — FAMOTIDINE IN NACL 20-0.9 MG/50ML-% IV SOLN
20.0000 mg | Freq: Once | INTRAVENOUS | Status: AC
  Administered 2021-04-25: 20 mg via INTRAVENOUS
  Filled 2021-04-25: qty 50

## 2021-04-25 MED ORDER — SCOPOLAMINE 1 MG/3DAYS TD PT72: 1.0000 | MEDICATED_PATCH | TRANSDERMAL | 12 refills | Status: DC

## 2021-04-25 MED ORDER — PROCHLORPERAZINE EDISYLATE 10 MG/2ML IJ SOLN
10.0000 mg | Freq: Once | INTRAMUSCULAR | Status: AC
Start: 2021-04-25 — End: 2021-04-25
  Administered 2021-04-25: 10 mg via INTRAVENOUS
  Filled 2021-04-25: qty 2

## 2021-04-25 MED ORDER — SCOPOLAMINE 1 MG/3DAYS TD PT72
1.0000 | MEDICATED_PATCH | TRANSDERMAL | Status: DC
  Administered 2021-04-25: 1.5 mg via TRANSDERMAL
  Filled 2021-04-25: qty 1

## 2021-04-25 NOTE — MAU Provider Note (Signed)
History     CSN: [id number]  Arrival date and time: 04/25/21 1337   Event Date/Time   First Provider Initiated Contact with Patient 04/25/21 1452      Chief Complaint  Patient presents with   Emesis   Nausea   dizziness   21 y.o. G3P1011 @[redacted]w[redacted]d  with IUP presenting with N/V. This has been ongoing for her during the [facility]. States the meds don't help. She took Zofran today and Phenergan yesterday. Has not started Robinul. Reports excess saliva and spitting. Reports 17 lb weight loss.   OB History     Gravida  3   Para  1   Term  1   Preterm      AB  1   Living  1      SAB      IAB  1   Ectopic      Multiple  0   Live Births  1           Past Medical History:  Diagnosis Date   Benign gestational thrombocytopenia in third trimester ([place]) 04/10/2019   [x ] f/u heme referral: done 2/24: fu heme onc labs. Will see 6wks PP  04/10/19: verified by microscope  CBC Latest Ref Rng & Units 04/10/2019 03/25/2019 12/06/2018  WBC 3.4 - 10.8 x10E3/uL 10.1 - -  Hemoglobin 11.1 - 15.9 g/dL 11.6 11.9 12.2  Hematocrit 34.0 - 46.6 % 33.9(L) 37 37  Platelets 150 - 450 x10E3/uL 92(LL) - 103      Delivery of pregnancy by cesarean section 06/01/2019   Laboratory examination ordered as part of a routine general medical examination 06/29/2020   Postpartum anemia 06/01/2019   Thrombocytopenia ([place])     Past Surgical History:  Procedure Laterality Date   CESAREAN SECTION N/A 05/30/2019   Procedure: CESAREAN SECTION;  Surgeon: Woodroe Mode, MD;  Location: [facility];  Service: Obstetrics;  Laterality: N/A;   FOREIGN BODY REMOVAL Left    sewing needle removed from big toe    Family History  Problem Relation Age of Onset   Healthy Mother    Healthy Father     Social History   Tobacco Use   Smoking status: Never   Smokeless tobacco: Never  Vaping Use   Vaping Use: Never used  Substance Use Topics   Alcohol use: Never   Drug use: Not Currently    Types: Marijuana    Comment:  last use a few months ago    Allergies: No Known Allergies  No medications prior to admission.    Review of Systems  Constitutional:  Negative for fever.  Gastrointestinal:  Positive for nausea and vomiting. Negative for abdominal pain.  Genitourinary:  Negative for vaginal bleeding.  Physical Exam   Blood pressure 135/73, pulse (!) 112, temperature 97.6 F (36.4 C), temperature source Oral, resp. rate 19, height 5\' 2"  (1.575 m), weight 52.8 kg, last menstrual period 03/02/2021, SpO2 100 %, not currently breastfeeding.  Physical Exam Vitals and nursing note reviewed.  Constitutional:      General: She is not in acute distress.    Appearance: Normal appearance.  HENT:     Head: Normocephalic and atraumatic.  Pulmonary:     Effort: Pulmonary effort is normal. No respiratory distress.  Musculoskeletal:        General: Normal range of motion.     Cervical back: Normal range of motion.  Neurological:     General: No focal deficit present.  Mental Status: She is alert and oriented to person, place, and time.  Psychiatric:        Mood and Affect: Mood normal.        Behavior: Behavior normal.   Results for orders placed or performed during the hospital encounter of 04/25/21 (from the past 24 hour(s))  Urinalysis, Routine w reflex microscopic Urine, Clean Catch     Status: Abnormal   Collection Time: 04/25/21  2:09 PM  Result Value Ref Range   Color, Urine AMBER (A) YELLOW   APPearance HAZY (A) CLEAR   Specific Gravity, Urine 1.030 1.005 - 1.030   pH 5.0 5.0 - 8.0   Glucose, UA NEGATIVE NEGATIVE mg/dL   Hgb urine dipstick MODERATE (A) NEGATIVE   Bilirubin Urine SMALL (A) NEGATIVE   Ketones, ur 80 (A) NEGATIVE mg/dL   Protein, ur 100 (A) NEGATIVE mg/dL   Nitrite NEGATIVE NEGATIVE   Leukocytes,Ua NEGATIVE NEGATIVE   RBC / HPF 0-5 0 - 5 RBC/hpf   WBC, UA 6-10 0 - 5 WBC/hpf   Bacteria, UA NONE SEEN NONE SEEN   Squamous Epithelial / LPF 0-5 0 - 5   Mucus PRESENT     Hyaline Casts, UA PRESENT   CBC     Status: Abnormal   Collection Time: 04/25/21  3:19 PM  Result Value Ref Range   WBC 13.6 (H) 4.0 - 10.5 K/uL   RBC 5.67 (H) 3.87 - 5.11 MIL/uL   Hemoglobin 15.4 (H) 12.0 - 15.0 g/dL   HCT 44.4 36.0 - 46.0 %   MCV 78.3 (L) 80.0 - 100.0 fL   MCH 27.2 26.0 - 34.0 pg   MCHC 34.7 30.0 - 36.0 g/dL   RDW 12.5 11.5 - 15.5 %   Platelets 132 (L) 150 - 400 K/uL   nRBC 0.0 0.0 - 0.2 %  Comprehensive metabolic panel     Status: Abnormal   Collection Time: 04/25/21  3:19 PM  Result Value Ref Range   Sodium 137 135 - 145 mmol/L   Potassium 3.2 (L) 3.5 - 5.1 mmol/L   Chloride 94 (L) 98 - 111 mmol/L   CO2 26 22 - 32 mmol/L   Glucose, Bld 99 70 - 99 mg/dL   BUN 14 6 - 20 mg/dL   Creatinine, Ser 0.75 0.44 - 1.00 mg/dL   Calcium 10.2 8.9 - 10.3 mg/dL   Total Protein 8.1 6.5 - 8.1 g/dL   Albumin 4.0 3.5 - 5.0 g/dL   AST 103 (H) 15 - 41 U/L   ALT 163 (H) 0 - 44 U/L   Alkaline Phosphatase 93 38 - 126 U/L   Total Bilirubin 2.7 (H) 0.3 - 1.2 mg/dL   GFR, Estimated >60 >60 mL/min   Anion gap 17 (H) 5 - 15   MAU Course  Procedures LR Pepcid Compazine Robinul  MDM Labs ordered and reviewed. Feeling better after meds. Tolerating po fluids and crackers. No further emesis or spitting. Discussed importance of regular med regimen. Finish K-Dur already prescribed. LFTs elevated, will f/u outpt. Stable for discharge home.   Assessment and Plan   1. [redacted] weeks gestation of pregnancy   2. Ptyalism   3. Morning sickness   4. Elevated liver enzymes   5. Dehydration    Discharge home Follow up at [facility] as scheduled Rx Scopolamine Return precautions  Allergies as of 04/25/2021   No Known Allergies      Medication List     TAKE these medications  glycopyrrolate 1 MG tablet Commonly known as: Robinul Take 1 tablet (1 mg total) by mouth 3 (three) times daily.   multivitamin-prenatal 27-0.8 MG Tabs tablet Take 1 tablet by mouth daily at 12 noon.    ondansetron 8 MG disintegrating tablet Commonly known as: ZOFRAN-ODT Take 1 tablet (8 mg total) by mouth every 8 (eight) hours as needed for nausea or vomiting.   potassium chloride SA 20 MEQ tablet Commonly known as: KLOR-CON M Take 2 tablets (40 mEq total) by mouth daily. What changed: Another medication with the same name was removed. Continue taking this medication, and follow the directions you see here.   promethazine 25 MG tablet Commonly known as: PHENERGAN Take 1 tablet (25 mg total) by mouth every 6 (six) hours as needed for nausea or vomiting.   scopolamine 1 MG/3DAYS Commonly known as: TRANSDERM-SCOP Place 1 patch (1.5 mg total) onto the skin every 3 (three) days. Start taking on: April 28, 2021       Julianne Handler, [place] 04/25/2021, 6:09 PM

## 2021-04-25 NOTE — MAU Note (Signed)
Presents with c/o N/V, states unable to keep anything down.  Reports prescribed med (Zofran) aren't working, last taken this morning.  Also reports lower abdominal cramping after vomiting.  Reports also having spotting, has bee evaluated previously for VB.

## 2021-04-26 LAB — CULTURE, OB URINE: Culture: <10000 — AB

## 2021-05-12 ENCOUNTER — Other Ambulatory Visit (HOSPITAL_COMMUNITY)
Admission: RE | Admit: 2021-05-12 | Discharge: 2021-05-12 | Disposition: A | Discharge status: Home / Self Care | Payer: Medicaid Other | Source: Ambulatory Visit | Attending: Obstetrics & Gynecology | Admitting: Obstetrics & Gynecology

## 2021-05-12 ENCOUNTER — Ambulatory Visit (INDEPENDENT_AMBULATORY_CARE_PROVIDER_SITE_OTHER): Payer: Medicaid Other | Admitting: Obstetrics & Gynecology

## 2021-05-12 ENCOUNTER — Other Ambulatory Visit: Payer: Self-pay

## 2021-05-12 ENCOUNTER — Encounter (HOSPITAL_BASED_OUTPATIENT_CLINIC_OR_DEPARTMENT_OTHER): Payer: Self-pay | Admitting: Obstetrics & Gynecology

## 2021-05-12 ENCOUNTER — Ambulatory Visit (INDEPENDENT_AMBULATORY_CARE_PROVIDER_SITE_OTHER): Payer: Medicaid Other

## 2021-05-12 ENCOUNTER — Ambulatory Visit (INDEPENDENT_AMBULATORY_CARE_PROVIDER_SITE_OTHER): Payer: Medicaid Other | Admitting: *Deleted

## 2021-05-12 VITALS — BP 134/81 | HR 87 | Wt 118.0 lb

## 2021-05-12 DIAGNOSIS — Z124 Encounter for screening for malignant neoplasm of cervix: Secondary | ICD-10-CM

## 2021-05-12 DIAGNOSIS — Z3481 Encounter for supervision of other normal pregnancy, first trimester: Secondary | ICD-10-CM

## 2021-05-12 DIAGNOSIS — O3680X0 Pregnancy with inconclusive fetal viability, not applicable or unspecified: Secondary | ICD-10-CM

## 2021-05-12 DIAGNOSIS — O3680X1 Pregnancy with inconclusive fetal viability, fetus 1: Secondary | ICD-10-CM | POA: Diagnosis not present

## 2021-05-12 DIAGNOSIS — N898 Other specified noninflammatory disorders of vagina: Secondary | ICD-10-CM | POA: Insufficient documentation

## 2021-05-12 DIAGNOSIS — Z8619 Personal history of other infectious and parasitic diseases: Secondary | ICD-10-CM | POA: Insufficient documentation

## 2021-05-12 DIAGNOSIS — O283 Abnormal ultrasonic finding on antenatal screening of mother: Secondary | ICD-10-CM

## 2021-05-12 DIAGNOSIS — Z3A10 10 weeks gestation of pregnancy: Secondary | ICD-10-CM

## 2021-05-12 DIAGNOSIS — Z862 Personal history of diseases of the blood and blood-forming organs and certain disorders involving the immune mechanism: Secondary | ICD-10-CM

## 2021-05-12 MED ORDER — BLOOD PRESSURE KIT DEVI: 1.0000 | Freq: Once | 0 refills | Status: AC

## 2021-05-12 MED ORDER — DOXYLAMINE-PYRIDOXINE 10-10 MG PO TBEC: DELAYED_RELEASE_TABLET | ORAL | 2 refills | Status: DC

## 2021-05-12 MED ORDER — GOJJI WEIGHT SCALE MISC: 1.0000 | Freq: Once | 0 refills | Status: AC

## 2021-05-12 NOTE — Progress Notes (Signed)
Pt here for new OB nurse interview. Pt denies vaginal bleeding. Has had some increased nausea/vomiting. U/S order placed to confirm pregnancy location due to increased nausea.  Pt is active in MyChart. Discussed Babyscripts and importance of picking up BP cuff from [facility] and entering BPs into babyscripts. OB booklet given and discussed. Pt to see Dr. Hyacinth Meeker for new OB intake.  ?

## 2021-05-13 ENCOUNTER — Encounter (HOSPITAL_BASED_OUTPATIENT_CLINIC_OR_DEPARTMENT_OTHER): Payer: Self-pay | Admitting: *Deleted

## 2021-05-13 ENCOUNTER — Encounter (HOSPITAL_BASED_OUTPATIENT_CLINIC_OR_DEPARTMENT_OTHER): Payer: Self-pay | Admitting: Obstetrics & Gynecology

## 2021-05-13 DIAGNOSIS — Z862 Personal history of diseases of the blood and blood-forming organs and certain disorders involving the immune mechanism: Secondary | ICD-10-CM | POA: Insufficient documentation

## 2021-05-13 DIAGNOSIS — Z3490 Encounter for supervision of normal pregnancy, unspecified, unspecified trimester: Secondary | ICD-10-CM | POA: Insufficient documentation

## 2021-05-13 LAB — COMPREHENSIVE METABOLIC PANEL
ALT: 13 IU/L (ref 0–32)
AST: 17 IU/L (ref 0–40)
Albumin/Globulin Ratio: 1.5 (ref 1.2–2.2)
Albumin: 4.3 g/dL (ref 3.9–5.0)
Alkaline Phosphatase: 76 IU/L (ref 44–121)
BUN/Creatinine Ratio: 16 (ref 9–23)
BUN: 11 mg/dL (ref 6–20)
Bilirubin Total: 0.4 mg/dL (ref 0.0–1.2)
CO2: 19 mmol/L — ABNORMAL LOW (ref 20–29)
Calcium: 9.7 mg/dL (ref 8.7–10.2)
Chloride: 94 mmol/L — ABNORMAL LOW (ref 96–106)
Creatinine, Ser: 0.67 mg/dL (ref 0.57–1.00)
Globulin, Total: 2.8 g/dL (ref 1.5–4.5)
Glucose: 142 mg/dL — ABNORMAL HIGH (ref 70–99)
Potassium: 3.4 mmol/L — ABNORMAL LOW (ref 3.5–5.2)
Sodium: 133 mmol/L — ABNORMAL LOW (ref 134–144)
Total Protein: 7.1 g/dL (ref 6.0–8.5)
eGFR: 126 mL/min/{1.73_m2} (ref >59)

## 2021-05-13 LAB — CERVICOVAGINAL ANCILLARY ONLY
Bacterial Vaginitis (gardnerella): POSITIVE — AB
Candida Glabrata: NEGATIVE
Candida Vaginitis: NEGATIVE
Chlamydia: NEGATIVE
Comment: NEGATIVE
Comment: NEGATIVE
Comment: NEGATIVE
Comment: NEGATIVE
Comment: NEGATIVE
Comment: NORMAL
Neisseria Gonorrhea: NEGATIVE
Trichomonas: NEGATIVE

## 2021-05-13 NOTE — Assessment & Plan Note (Signed)
?  Nursing Staff Provider  ?Office Location  [facility] Dating   LMP c/w [place]  ?PNC Model [x]  Traditional ?[ ]  Centering ?[ ]  Mom-Baby Dyad    ?Language   English Anatomy    ?Flu Vaccine   Genetic/Carrier Screen  NIPS:    ?AFP:    ?Horizon:  ?TDaP Vaccine    Hgb A1C or  ?GTT Early  ?Third trimester   ?COVID Vaccine    LAB RESULTS   ?Rhogam   Blood Type     ?Baby Feeding Plan  Antibody    ?Contraception  Rubella    ?Circumcision  RPR     ?Pediatrician   HBsAg     ?Support Person  HCVAb   ?Prenatal Classes  HIV       ?BTL Consent  GBS   ?(For PCN allergy, check sensitivities)   ?VBAC Consent  Pap  05/13/21  ?     ?DME Rx [x]  BP cuff ?[x]  Weight Scale Waterbirth  [ ]  Class [ ]  Consent [ ]  CNM visit  ?PHQ9 & GAD7 [x]  new OB ?[  ] 28 weeks  ?[  ] 36 weeks Induction  [ ]  Orders Entered [ ] Foley Y/N  ? ?

## 2021-05-13 NOTE — Progress Notes (Signed)
? ?History:  ? Shirlean SchleinHaleigh Giglia is a 21 y.o. G3P1011 at 4417w3d by LMP being seen today for her first obstetrical visit.  Her obstetrical history is significant for  nausea and vomiting in first trimester.  Had this with first pregnancy.  Has tried phenergan but still throwing up multiple times daily.  Diclegis discussed.  Pt has tried ondansetron without success . Patient is unsure about breast feeding plan at this moment. ? ? Pregnancy history fully reviewed.  H/o thrombocytopenia with last pregnancy in third trimester. ? ?Patient reports  no vaginal bleeding or pelvic pain . ?  ? ?  ?HISTORY: ?OB History  ?Gravida Para Term Preterm AB Living  ?3 1 1  0 1 1  ?SAB IAB Ectopic Multiple Live Births  ?0 1 0 0 1  ?  ?# Outcome Date GA Lbr Len/2nd Weight Sex Delivery Anes PTL Lv  ?3 Current           ?2 IAB 08/2020          ?1 Term 05/30/19 5186w1d  5 lb 6.6 oz (2.455 kg) M CS-LTranv Gen  LIV  ?   Name: Thereasa DistanceCHISHOLM,BOY Sadaf  ?   Apgar1: 6  Apgar5: 8  ?  ?Last pap smear was obtained today. ? ?Past Medical History:  ?Diagnosis Date  ? Benign gestational thrombocytopenia in third trimester ([facility]) 04/10/2019  ? [x ] f/u heme referral: done 2/24: fu heme onc labs. Will see 6wks PP  04/10/19: verified by microscope  CBC Latest Ref Rng & Units 04/10/2019 03/25/2019 12/06/2018  WBC 3.4 - 10.8 x10E3/uL 10.1 - -  Hemoglobin 11.1 - 15.9 g/dL 16.111.6 09.611.9 04.512.2  Hematocrit 34.0 - 46.6 % 33.9(L) 37 37  Platelets 150 - 450 x10E3/uL 92(LL) - 103     ? Delivery of pregnancy by cesarean section 06/01/2019  ? Laboratory examination ordered as part of a routine general medical examination 06/29/2020  ? Postpartum anemia 06/01/2019  ? ?Past Surgical History:  ?Procedure Laterality Date  ? CESAREAN SECTION N/A 05/30/2019  ? Procedure: CESAREAN SECTION;  Surgeon: Adam PhenixArnold, James G, MD;  [facility];  Service: Obstetrics;  Laterality: N/A;  ? FOREIGN BODY REMOVAL Left   ? sewing needle removed from big toe  ? ?Family History  ?Problem Relation Age of  Onset  ? Healthy Mother   ? Hypertension Father   ? Healthy Father   ? Diabetes Maternal Grandmother   ? Hypertension Paternal Grandmother   ? Diabetes Paternal Grandmother   ? Hypertension Paternal Grandfather   ? Diabetes Paternal Grandfather   ? ?Social History  ? ?Tobacco Use  ? Smoking status: Never  ? Smokeless tobacco: Never  ?Vaping Use  ? Vaping Use: Never used  ?Substance Use Topics  ? Alcohol use: Never  ? Drug use: Not Currently  ?  Types: Marijuana  ?  Comment: last use a few months ago  ? ?No Known Allergies ?Current Outpatient Medications on File Prior to Visit  ?Medication Sig Dispense Refill  ? Prenatal Vit-Fe Fumarate-FA (MULTIVITAMIN-PRENATAL) 27-0.8 MG TABS tablet Take 1 tablet by mouth daily at 12 noon. 30 tablet 0  ? promethazine (PHENERGAN) 25 MG tablet Take 1 tablet (25 mg total) by mouth every 6 (six) hours as needed for nausea or vomiting. 30 tablet 0  ? glycopyrrolate (ROBINUL) 1 MG tablet Take 1 tablet (1 mg total) by mouth 3 (three) times daily. (Patient not taking: Reported on 05/12/2021) 90 tablet 1  ? ?No current facility-administered medications  on file prior to visit.  ? ? ?Review of Systems ?Pertinent items noted in HPI and remainder of comprehensive ROS otherwise negative. ? ?Physical Exam:  ? ?Vitals:  ? 05/12/21 1137  ?BP: 134/81  ?Pulse: 87  ?Weight: 118 lb (53.5 kg)  ? ?Fetal Heart Rate (bpm): 145 ?Bedside Ultrasound for FHR check: Viable intrauterine pregnancy with positive cardiac activity noted, fetal heart rate 145bpm.  Second gestational sac with possible regressing fetal pole noted.  This is irregular and without evidence of FCA. ? ?Patient informed that the ultrasound is considered a limited obstetric ultrasound and is not intended to be a complete ultrasound exam.  Patient also informed that the ultrasound is not being completed with the intent of assessing for fetal or placental anomalies or any pelvic abnormalities.  Explained that the purpose of today?s ultrasound is  to assess for fetal heart rate.  Patient acknowledges the purpose of the exam and the limitations of the study. ?General: well-developed, well-nourished female in no acute distress  ?Breasts:  normal appearance, no masses or tenderness bilaterally  ?Skin: normal coloration and turgor, no rashes  ?Neurologic: oriented, normal, negative, normal mood  ?Extremities: normal strength, tone, and muscle mass, ROM of all joints is normal  ?HEENT PERRLA, extraocular movement intact and sclera clear, anicteric  ?Neck supple and no masses  ?Cardiovascular: regular rate and rhythm  ?Respiratory:  no respiratory distress, normal breath sounds  ?Abdomen: soft, non-tender; bowel sounds normal; no masses,  no organomegaly  ?Pelvic: normal external genitalia, no lesions, normal vaginal mucosa, normal vaginal discharge, normal cervix, pap smear done. Uterine size: 10-12 weeks   ?  ?Assessment:  ?  ?Pregnancy: G3P1011 ?Patient Active Problem List  ? Diagnosis Date Noted  ? Supervision of normal pregnancy 05/13/2021  ? History of thrombocytopenia 05/13/2021  ? ?  ?Plan:  ?  ?1. Encounter for supervision of other normal pregnancy in first trimester ?- on PNV ?- ABO/Rh ?- Antibody screen ?- CBC ?- Hepatitis B surface antigen ?- HIV (Save tube for possible reflex) ?- RPR ?- Rubella screen ?- Hepatitis C antibody ?- HIV Antibody (routine testing w rflx) ?- Comprehensive metabolic panel ?- [place] MFM OB COMP + 14 WK; Future ?- Genetic Screening ? ?2. Cervical cancer screening ?- Cytology - PAP( [place]) ? ?3. History of chlamydia ?- sTI testing repeated today ? ?4. Vaginal discharge ?- due to discharge, yeast and BV testing also obtained ? ?5. History of thrombocytopenia ?- communication sent to Dr. Jeanie Sewer, hematology/oncology, who saw pt with prior pregnancy.  ?- will plan to check platelet ct every trimester ? ?6.  Abnormal first trimester ultrasound ?- second sac noted with irregular appearing possible fetal pole present.  No  evidence of FCA. ? ?Initial labs drawn. ?Continue prenatal vitamins. ?Problem list reviewed and updated. ?Genetic Screening discussed, NIPS: ordered. ?Ultrasound discussed; fetal anatomic survey: ordered. ?Anticipatory guidance about prenatal visits given including labs, ultrasounds, and testing. ?Discussed usage of Babyscripts and virtual visits as additional source of managing and completing prenatal visits in midst of coronavirus and pandemic.   ?Encouraged to complete MyChart Registration for her ability to review results, send requests, and have questions addressed.  ?The nature of [place] [facility]/Faculty Practice with multiple MDs and Advanced Practice Providers was explained to patient; also emphasized that residents, students are part of our team. ?Routine obstetric precautions reviewed. Encouraged to seek out care at office or emergency room [facility] MAU preferred) for urgent and/or emergent concerns. ?  Return in about 4 weeks (around 06/09/2021).  ?  ? ?M. Leda Quail, MD, FACOG ?Obstetrician Heritage manager, Faculty Practice ?Center for [employer], [facility] ? ? ?

## 2021-05-14 LAB — CBC
Hematocrit: 42.3 % (ref 34.0–46.6)
Hemoglobin: 14.3 g/dL (ref 11.1–15.9)
MCH: 27.6 pg (ref 26.6–33.0)
MCHC: 33.8 g/dL (ref 31.5–35.7)
MCV: 82 fL (ref 79–97)
Platelets: 145 10*3/uL — ABNORMAL LOW (ref 150–450)
RBC: 5.18 x10E6/uL (ref 3.77–5.28)
RDW: 13.1 % (ref 11.7–15.4)
WBC: 10.5 10*3/uL (ref 3.4–10.8)

## 2021-05-14 LAB — HEPATITIS B SURFACE ANTIGEN: Hepatitis B Surface Ag: NEGATIVE

## 2021-05-14 LAB — HIV ANTIBODY (ROUTINE TESTING W REFLEX)

## 2021-05-14 LAB — RUBELLA SCREEN: Rubella Antibodies, IGG: 2.87 index (ref >0.99)

## 2021-05-14 LAB — HEPATITIS C ANTIBODY: Hep C Virus Ab: NONREACTIVE

## 2021-05-14 LAB — ABO/RH: Rh Factor: POSITIVE

## 2021-05-14 LAB — ANTIBODY SCREEN: Antibody Screen: NEGATIVE

## 2021-05-14 LAB — RPR: RPR Ser Ql: NONREACTIVE

## 2021-05-17 LAB — CYTOLOGY - PAP
Diagnosis: NEGATIVE
Diagnosis: REACTIVE

## 2021-05-18 ENCOUNTER — Other Ambulatory Visit (HOSPITAL_BASED_OUTPATIENT_CLINIC_OR_DEPARTMENT_OTHER): Payer: Self-pay | Admitting: *Deleted

## 2021-05-18 MED ORDER — METRONIDAZOLE 0.75 % VA GEL: 1.0000 | Freq: Every day | VAGINAL | 0 refills | Status: DC

## 2021-05-18 NOTE — Progress Notes (Signed)
Rx for metrogel sent to pharmacy for treatment of BV + on aptima swab ?

## 2021-05-27 ENCOUNTER — Ambulatory Visit (INDEPENDENT_AMBULATORY_CARE_PROVIDER_SITE_OTHER): Payer: Medicaid Other

## 2021-05-27 ENCOUNTER — Ambulatory Visit (INDEPENDENT_AMBULATORY_CARE_PROVIDER_SITE_OTHER): Payer: Medicaid Other | Admitting: Obstetrics & Gynecology

## 2021-05-27 VITALS — BP 107/70 | HR 83 | Wt 133.4 lb

## 2021-05-27 DIAGNOSIS — O3680X2 Pregnancy with inconclusive fetal viability, fetus 2: Secondary | ICD-10-CM

## 2021-05-27 DIAGNOSIS — O3110X0 Continuing pregnancy after spontaneous abortion of one fetus or more, unspecified trimester, not applicable or unspecified: Secondary | ICD-10-CM

## 2021-05-27 DIAGNOSIS — Z3A12 12 weeks gestation of pregnancy: Secondary | ICD-10-CM

## 2021-05-27 DIAGNOSIS — Z3481 Encounter for supervision of other normal pregnancy, first trimester: Secondary | ICD-10-CM

## 2021-05-27 DIAGNOSIS — O3110X1 Continuing pregnancy after spontaneous abortion of one fetus or more, unspecified trimester, fetus 1: Secondary | ICD-10-CM

## 2021-05-27 DIAGNOSIS — Z98891 History of uterine scar from previous surgery: Secondary | ICD-10-CM

## 2021-05-30 ENCOUNTER — Encounter (HOSPITAL_BASED_OUTPATIENT_CLINIC_OR_DEPARTMENT_OTHER): Payer: Self-pay | Admitting: Obstetrics & Gynecology

## 2021-05-30 NOTE — Progress Notes (Signed)
? ?  PRENATAL VISIT NOTE ? ?Subjective:  ?Ruth Solomon is a 21 y.o. G3P1011 at [redacted]w[redacted]d being seen today for ongoing prenatal care.  She is currently monitored for the following issues for this low-risk pregnancy and has Supervision of normal pregnancy and History of thrombocytopenia on their problem list. ? ?Patient reports  nausea is much improved .  Not really taking any medications at this point but being careful about what she eats/takes orally.  Contractions: Not present.   Movement: Absent. Denies leaking of fluid.  ? ?The following portions of the patient's history were reviewed and updated as appropriate: allergies, current medications, past family history, past medical history, past social history, past surgical history and problem list.  ? ?Objective:  ? ?Vitals:  ? 05/27/21 1123  ?BP: 107/70  ?Pulse: 83  ?Weight: 133 lb 6.4 oz (60.5 kg)  ? ? ?Fetal Status: Fetal Heart Rate (bpm): 152   Movement: Absent    ? ?General:  Alert, oriented and cooperative. Patient is in no acute distress.  ?Skin: Skin is warm and dry. No rash noted.   ?Cardiovascular: Normal heart rate noted  ?Respiratory: Normal respiratory effort, no problems with respiration noted  ?Abdomen: Soft, gravid, appropriate for gestational age.  Pain/Pressure: Absent     ?Pelvic: Cervical exam deferred        ?Extremities: Normal range of motion.  Edema: None  ?Mental Status: Normal mood and affect. Normal behavior. Normal judgment and thought content.  ? ?Bedside ultrasound repeated today.  Second sac cannot be seen.  Singleton IUP showed adequate interval growth and normal fluid.  FCA at 152 BPM ? ?Assessment and Plan:  ?Pregnancy: G3P1011 at [redacted]w[redacted]d ?1. Encounter for supervision of other normal pregnancy in first trimester ?- continue PNV ?- Return 3 weeks ?- Anatomy scan is scheduled ? ?2. Encounter to determine fetal viability of pregnancy, fetus 2 ? ?3. History of cesarean section ?- pt does desire repeat ? ?4. Vanishing twin syndrome ?- no  evidence of second sac on ultrasound today  ? ?Preterm labor symptoms and general obstetric precautions including but not limited to vaginal bleeding, contractions, leaking of fluid and fetal movement were reviewed in detail with the patient. ?Please refer to After Visit Summary for other counseling recommendations.  ? ?Return in about 3 weeks (around 06/17/2021) for AFP. ? ?Future Appointments  ?Date Time Provider Department Center  ?06/14/2021 11:00 AM Jerene Bears, MD [facility]  ?07/01/2021 10:10 AM Early, Sung Amabile, NP [facility]  ?07/12/2021 10:30 AM [facility] US3 [facility]  ? ? ?Jerene Bears, MD  ?

## 2021-06-08 ENCOUNTER — Encounter (HOSPITAL_BASED_OUTPATIENT_CLINIC_OR_DEPARTMENT_OTHER): Payer: Self-pay | Admitting: *Deleted

## 2021-06-08 DIAGNOSIS — D563 Thalassemia minor: Secondary | ICD-10-CM | POA: Insufficient documentation

## 2021-06-14 ENCOUNTER — Ambulatory Visit (INDEPENDENT_AMBULATORY_CARE_PROVIDER_SITE_OTHER): Payer: Medicaid Other | Admitting: Obstetrics & Gynecology

## 2021-06-14 VITALS — BP 126/72 | HR 83 | Wt 142.2 lb

## 2021-06-14 DIAGNOSIS — Z862 Personal history of diseases of the blood and blood-forming organs and certain disorders involving the immune mechanism: Secondary | ICD-10-CM

## 2021-06-14 DIAGNOSIS — Z3482 Encounter for supervision of other normal pregnancy, second trimester: Secondary | ICD-10-CM

## 2021-06-14 DIAGNOSIS — Z3A15 15 weeks gestation of pregnancy: Secondary | ICD-10-CM

## 2021-06-14 NOTE — Progress Notes (Signed)
? ?  PRENATAL VISIT NOTE ? ?Subjective:  ?Ruth Solomon is a 21 y.o. G3P1011 at [redacted]w[redacted]d being seen today for ongoing prenatal care.  She is currently monitored for the following issues for this low-risk pregnancy and has Supervision of normal pregnancy; History of thrombocytopenia; Alpha thalassemia silent carrier; and Genetic carrier status on their problem list. ? ?Patient reports no complaints.  Nausea and emesis has resolved.  She is aware her live enzymes have normalized.  Contractions: Not present. Vag. Bleeding: None.  Movement: Absent. Denies leaking of fluid.  ? ?The following portions of the patient's history were reviewed and updated as appropriate: allergies, current medications, past family history, past medical history, past social history, past surgical history and problem list.  ? ?Objective:  ? ?Vitals:  ? 06/14/21 1121  ?BP: 126/72  ?Pulse: 83  ?Weight: 142 lb 3.2 oz (64.5 kg)  ? ? ?Fetal Status: Fetal Heart Rate (bpm): 145   Movement: Absent    ? ?General:  Alert, oriented and cooperative. Patient is in no acute distress.  ?Skin: Skin is warm and dry. No rash noted.   ?Cardiovascular: Normal heart rate noted  ?Respiratory: Normal respiratory effort, no problems with respiration noted  ?Abdomen: Soft, gravid, appropriate for gestational age.  Pain/Pressure: Absent     ?Pelvic: Cervical exam deferred        ?Extremities: Normal range of motion.  Edema: None  ?Mental Status: Normal mood and affect. Normal behavior. Normal judgment and thought content.  ? ?Assessment and Plan:  ?Pregnancy: G3P1011 at [redacted]w[redacted]d ?1. Encounter for supervision of other normal pregnancy in second trimester ?-  on PNV ?- 19 week anatomy scan scheduled for pt ?- AFP, Serum, Open Spina Bifida ? ?2. [redacted] weeks gestation of pregnancy ? ?3. Alpha thalassemia silent carrier ?- reviewed findings from [facility] results with pt.  Partner with her today and he desires testing as well.  This was ordered and obtained today. ? ?4. History of  thrombocytopenia ?- Dr. Leonides Schanz, hematologist recommends consult if platelet count gets below 80K.  Reviewed with pt.   ? ?Preterm labor symptoms and general obstetric precautions including but not limited to vaginal bleeding, contractions, leaking of fluid and fetal movement were reviewed in detail with the patient. ?Please refer to After Visit Summary for other counseling recommendations.  ? ?Return in about 4 weeks (around 07/12/2021). ? ?Future Appointments  ?Date Time Provider Department Center  ?07/12/2021 10:30 AM [facility]3 [facility]  ?07/13/2021 11:45 AM Leftwich-Kirby, Wilmer Floor, CNM [facility]  ?08/10/2021 10:15 AM Leftwich-Kirby, Wilmer Floor, CNM [facility]  ?09/07/2021  8:30 AM Leftwich-Kirby, Wilmer Floor, CNM [facility]  ?09/27/2021 11:30 AM Jerene Bears, MD [facility]  ?10/12/2021 11:45 AM Jerene Bears, MD [facility]  ?10/25/2021  1:15 PM Jerene Bears, MD [facility]  ?11/08/2021 11:45 AM Jerene Bears, MD [facility]  ?11/15/2021 10:15 AM Jerene Bears, MD [facility]  ?11/22/2021 11:15 AM Jerene Bears, MD [facility]  ?11/29/2021 11:15 AM Jerene Bears, MD [facility]  ?12/09/2021 10:45 AM Jerene Bears, MD [facility]  ? ? ?Jerene Bears, MD  ?

## 2021-06-15 LAB — AFP, SERUM, OPEN SPINA BIFIDA
AFP MoM: 1.31
AFP Value: 48.7 ng/mL
Gest. Age on Collection Date: 15 weeks
Maternal Age At EDD: 23.6 yr
OSBR Risk 1 IN: 9327
Test Results:: NEGATIVE
Weight: 127 [lb_av]

## 2021-06-16 DIAGNOSIS — Z148 Genetic carrier of other disease: Secondary | ICD-10-CM | POA: Insufficient documentation

## 2021-07-01 ENCOUNTER — Encounter (HOSPITAL_BASED_OUTPATIENT_CLINIC_OR_DEPARTMENT_OTHER): Payer: Medicaid Other | Admitting: Nurse Practitioner

## 2021-07-12 ENCOUNTER — Other Ambulatory Visit (HOSPITAL_BASED_OUTPATIENT_CLINIC_OR_DEPARTMENT_OTHER): Payer: Self-pay | Admitting: Obstetrics & Gynecology

## 2021-07-12 ENCOUNTER — Ambulatory Visit: Payer: Medicaid Other | Attending: Obstetrics & Gynecology

## 2021-07-12 ENCOUNTER — Other Ambulatory Visit: Payer: Self-pay | Admitting: *Deleted

## 2021-07-12 DIAGNOSIS — O99112 Other diseases of the blood and blood-forming organs and certain disorders involving the immune mechanism complicating pregnancy, second trimester: Secondary | ICD-10-CM | POA: Diagnosis not present

## 2021-07-12 DIAGNOSIS — O34219 Maternal care for unspecified type scar from previous cesarean delivery: Secondary | ICD-10-CM

## 2021-07-12 DIAGNOSIS — O283 Abnormal ultrasonic finding on antenatal screening of mother: Secondary | ICD-10-CM

## 2021-07-12 DIAGNOSIS — D696 Thrombocytopenia, unspecified: Secondary | ICD-10-CM | POA: Insufficient documentation

## 2021-07-12 DIAGNOSIS — O35BXX1 Maternal care for other (suspected) fetal abnormality and damage, fetal cardiac anomalies, fetus 1: Secondary | ICD-10-CM | POA: Insufficient documentation

## 2021-07-12 DIAGNOSIS — Z3A19 19 weeks gestation of pregnancy: Secondary | ICD-10-CM | POA: Insufficient documentation

## 2021-07-12 DIAGNOSIS — Z148 Genetic carrier of other disease: Secondary | ICD-10-CM | POA: Insufficient documentation

## 2021-07-12 DIAGNOSIS — O99119 Other diseases of the blood and blood-forming organs and certain disorders involving the immune mechanism complicating pregnancy, unspecified trimester: Secondary | ICD-10-CM

## 2021-07-12 DIAGNOSIS — Z3481 Encounter for supervision of other normal pregnancy, first trimester: Secondary | ICD-10-CM | POA: Diagnosis not present

## 2021-07-12 DIAGNOSIS — D563 Thalassemia minor: Secondary | ICD-10-CM | POA: Insufficient documentation

## 2021-07-12 DIAGNOSIS — Z363 Encounter for antenatal screening for malformations: Secondary | ICD-10-CM | POA: Diagnosis present

## 2021-07-13 ENCOUNTER — Ambulatory Visit (INDEPENDENT_AMBULATORY_CARE_PROVIDER_SITE_OTHER): Payer: Medicaid Other | Admitting: Advanced Practice Midwife

## 2021-07-13 VITALS — BP 118/60 | HR 74 | Wt 145.4 lb

## 2021-07-13 DIAGNOSIS — Z3482 Encounter for supervision of other normal pregnancy, second trimester: Secondary | ICD-10-CM

## 2021-07-13 DIAGNOSIS — Z3A19 19 weeks gestation of pregnancy: Secondary | ICD-10-CM

## 2021-07-13 DIAGNOSIS — O99119 Other diseases of the blood and blood-forming organs and certain disorders involving the immune mechanism complicating pregnancy, unspecified trimester: Secondary | ICD-10-CM

## 2021-07-13 DIAGNOSIS — D696 Thrombocytopenia, unspecified: Secondary | ICD-10-CM

## 2021-07-13 DIAGNOSIS — Z98891 History of uterine scar from previous surgery: Secondary | ICD-10-CM

## 2021-07-13 NOTE — Progress Notes (Signed)
? ?  PRENATAL VISIT NOTE ? ?Subjective:  ?Ruth Solomon is a 21 y.o. G3P1011 at [redacted]w[redacted]d being seen today for ongoing prenatal care.  She is currently monitored for the following issues for this low-risk pregnancy and has Supervision of normal pregnancy; History of thrombocytopenia; Alpha thalassemia silent carrier; Genetic carrier status; and Fetal cardiac echogenic focus, antepartum, fetus 1 on their problem list. ? ?Patient reports no complaints.  Contractions: Not present. Vag. Bleeding: None.  Movement: Present. Denies leaking of fluid.  ? ?The following portions of the patient's history were reviewed and updated as appropriate: allergies, current medications, past family history, past medical history, past social history, past surgical history and problem list.  ? ?Objective:  ? ?Vitals:  ? 07/13/21 1120  ?BP: 118/60  ?Pulse: 74  ?Weight: 145 lb 6.4 oz (66 kg)  ? ? ?Fetal Status: Fetal Heart Rate (bpm): 150   Movement: Present    ? ?General:  Alert, oriented and cooperative. Patient is in no acute distress.  ?Skin: Skin is warm and dry. No rash noted.   ?Cardiovascular: Normal heart rate noted  ?Respiratory: Normal respiratory effort, no problems with respiration noted  ?Abdomen: Soft, gravid, appropriate for gestational age.  Pain/Pressure: Absent     ?Pelvic: Cervical exam deferred        ?Extremities: Normal range of motion.  Edema: None  ?Mental Status: Normal mood and affect. Normal behavior. Normal judgment and thought content.  ? ?Assessment and Plan:  ?Pregnancy: G3P1011 at [redacted]w[redacted]d ?1. Encounter for supervision of other normal pregnancy in second trimester ?--Anticipatory guidance about next visits/weeks of pregnancy given.  ?--Reviewed normal anatomy US yesterday, IECF only, normal NIPS ? ?2. Benign gestational thrombocytopenia, antepartum (HCC) ?--Plts 145 on 05/12/21 ?-CBC today ? ?3. [redacted] weeks gestation of pregnancy ? ? ?4. History of cesarean section ?--Desires repeat, but discussed today, would  consider VBAC if she can get epidural with her platelets. ?--Will discuss at future visits ? ?Preterm labor symptoms and general obstetric precautions including but not limited to vaginal bleeding, contractions, leaking of fluid and fetal movement were reviewed in detail with the patient. ?Please refer to After Visit Summary for other counseling recommendations.  ? ?Return for As scheduled. ? ?Future Appointments  ?Date Time Provider Department Center  ?08/09/2021 10:15 AM [facility] NURSE [facility]  ?08/09/2021 10:30 AM [facility]  ?08/10/2021 10:15 AM Leftwich-Kirby, Wilmer Floor, CNM [facility]  ?09/07/2021  8:30 AM Leftwich-Kirby, Wilmer Floor, CNM [facility]  ?09/27/2021 11:30 AM Jerene Bears, MD [facility]  ?10/12/2021 11:45 AM Jerene Bears, MD [facility]  ?10/25/2021  1:15 PM Jerene Bears, MD [facility]  ?11/08/2021 11:45 AM Jerene Bears, MD [facility]  ?11/15/2021 10:15 AM Jerene Bears, MD [facility]  ?11/22/2021 11:15 AM Jerene Bears, MD [facility]  ?11/29/2021 11:15 AM Jerene Bears, MD [facility]  ?12/09/2021 10:45 AM Jerene Bears, MD [facility]  ? ? ?Sharen Counter, CNM  ?

## 2021-07-14 ENCOUNTER — Encounter (HOSPITAL_BASED_OUTPATIENT_CLINIC_OR_DEPARTMENT_OTHER): Payer: Self-pay | Admitting: Advanced Practice Midwife

## 2021-07-14 LAB — CBC
Hematocrit: 34.8 % (ref 34.0–46.6)
Hemoglobin: 11.7 g/dL (ref 11.1–15.9)
MCH: 27.5 pg (ref 26.6–33.0)
MCHC: 33.6 g/dL (ref 31.5–35.7)
MCV: 82 fL (ref 79–97)
Platelets: 95 10*3/uL — CL (ref 150–450)
RBC: 4.26 x10E6/uL (ref 3.77–5.28)
RDW: 12.8 % (ref 11.7–15.4)
WBC: 12 10*3/uL — ABNORMAL HIGH (ref 3.4–10.8)

## 2021-08-09 ENCOUNTER — Ambulatory Visit: Payer: Medicaid Other | Attending: Obstetrics

## 2021-08-09 ENCOUNTER — Ambulatory Visit: Payer: Medicaid Other | Admitting: *Deleted

## 2021-08-09 ENCOUNTER — Encounter: Payer: Self-pay | Admitting: *Deleted

## 2021-08-09 VITALS — BP 111/60 | HR 71

## 2021-08-09 DIAGNOSIS — D696 Thrombocytopenia, unspecified: Secondary | ICD-10-CM

## 2021-08-09 DIAGNOSIS — O283 Abnormal ultrasonic finding on antenatal screening of mother: Secondary | ICD-10-CM | POA: Insufficient documentation

## 2021-08-09 DIAGNOSIS — D563 Thalassemia minor: Secondary | ICD-10-CM

## 2021-08-09 DIAGNOSIS — Z3A23 23 weeks gestation of pregnancy: Secondary | ICD-10-CM

## 2021-08-09 DIAGNOSIS — O285 Abnormal chromosomal and genetic finding on antenatal screening of mother: Secondary | ICD-10-CM | POA: Diagnosis not present

## 2021-08-09 DIAGNOSIS — O99112 Other diseases of the blood and blood-forming organs and certain disorders involving the immune mechanism complicating pregnancy, second trimester: Secondary | ICD-10-CM

## 2021-08-09 DIAGNOSIS — O34219 Maternal care for unspecified type scar from previous cesarean delivery: Secondary | ICD-10-CM | POA: Insufficient documentation

## 2021-08-09 DIAGNOSIS — O99119 Other diseases of the blood and blood-forming organs and certain disorders involving the immune mechanism complicating pregnancy, unspecified trimester: Secondary | ICD-10-CM | POA: Insufficient documentation

## 2021-08-10 ENCOUNTER — Encounter (HOSPITAL_BASED_OUTPATIENT_CLINIC_OR_DEPARTMENT_OTHER): Payer: Medicaid Other | Admitting: Advanced Practice Midwife

## 2021-08-10 ENCOUNTER — Ambulatory Visit (INDEPENDENT_AMBULATORY_CARE_PROVIDER_SITE_OTHER): Payer: Medicaid Other | Admitting: Advanced Practice Midwife

## 2021-08-10 VITALS — BP 129/64 | HR 80 | Wt 153.2 lb

## 2021-08-10 DIAGNOSIS — Z3482 Encounter for supervision of other normal pregnancy, second trimester: Secondary | ICD-10-CM

## 2021-08-10 DIAGNOSIS — Z3A23 23 weeks gestation of pregnancy: Secondary | ICD-10-CM

## 2021-08-10 DIAGNOSIS — D696 Thrombocytopenia, unspecified: Secondary | ICD-10-CM

## 2021-08-10 DIAGNOSIS — Z98891 History of uterine scar from previous surgery: Secondary | ICD-10-CM

## 2021-08-10 DIAGNOSIS — O99119 Other diseases of the blood and blood-forming organs and certain disorders involving the immune mechanism complicating pregnancy, unspecified trimester: Secondary | ICD-10-CM

## 2021-08-10 NOTE — Progress Notes (Signed)
   PRENATAL VISIT NOTE  Subjective:  Ruth Solomon is a 21 y.o. G3P1011 at [redacted]w[redacted]d being seen today for ongoing prenatal care.  She is currently monitored for the following issues for this low-risk pregnancy and has Supervision of normal pregnancy; History of thrombocytopenia; Alpha thalassemia silent carrier; Genetic carrier status; and Fetal cardiac echogenic focus, antepartum, fetus 1 on their problem list.  Patient reports no complaints.  Contractions: Not present. Vag. Bleeding: None.  Movement: Present. Denies leaking of fluid.   The following portions of the patient's history were reviewed and updated as appropriate: allergies, current medications, past family history, past medical history, past social history, past surgical history and problem list. Problem list updated.  Objective:   Vitals:   08/10/21 1028  BP: 129/64  Pulse: 80  Weight: 153 lb 3.2 oz (69.5 kg)    Fetal Status: Fetal Heart Rate (bpm): 142   Movement: Present     General:  Alert, oriented and cooperative. Patient is in no acute distress.  Skin: Skin is warm and dry. No rash noted.   Cardiovascular: Normal heart rate noted  Respiratory: Normal respiratory effort, no problems with respiration noted  Abdomen: Soft, gravid, appropriate for gestational age.  Pain/Pressure: Absent     Pelvic: Cervical exam deferred        Extremities: Normal range of motion.  Edema: None  Mental Status: Normal mood and affect. Normal behavior. Normal judgment and thought content.   Assessment and Plan:  Pregnancy: G3P1011 at [redacted]w[redacted]d  1. Encounter for supervision of other normal pregnancy in second trimester - LROB, no complaints or concerns - Preemptive discussion: fasting for 2 hour GTT next visit  2. Benign gestational thrombocytopenia, antepartum (HCC) - Will recheck with third trimester labs  3. History of cesarean section - Desires repeat if thrombocytopenia risks her out from epidural placement  4. [redacted] weeks  gestation of pregnancy   Preterm labor symptoms and general obstetric precautions including but not limited to vaginal bleeding, contractions, leaking of fluid and fetal movement were reviewed in detail with the patient. Please refer to After Visit Summary for other counseling recommendations.  Return in about 4 weeks (around 09/07/2021) for Fasting next visit for GTT.  Future Appointments  Date Time Provider Department Center  09/07/2021  8:30 AM Hurshel Party, CNM [facility]  09/27/2021 11:30 AM Jerene Bears, MD [facility]  10/13/2021 11:30 AM Jerene Bears, MD [facility]  10/25/2021  1:15 PM Jerene Bears, MD [facility]  11/08/2021 11:45 AM Jerene Bears, MD [facility]  11/15/2021 10:15 AM Jerene Bears, MD [facility]  11/22/2021 11:15 AM Jerene Bears, MD [facility]  11/29/2021 11:15 AM Jerene Bears, MD [facility]  12/09/2021 10:45 AM Jerene Bears, MD [facility]    Calvert Cantor, CNM

## 2021-08-26 ENCOUNTER — Encounter (HOSPITAL_BASED_OUTPATIENT_CLINIC_OR_DEPARTMENT_OTHER): Payer: Self-pay | Admitting: Advanced Practice Midwife

## 2021-09-07 ENCOUNTER — Ambulatory Visit (INDEPENDENT_AMBULATORY_CARE_PROVIDER_SITE_OTHER): Payer: Medicaid Other | Admitting: Advanced Practice Midwife

## 2021-09-07 VITALS — BP 102/88 | HR 84 | Wt 156.6 lb

## 2021-09-07 DIAGNOSIS — Z23 Encounter for immunization: Secondary | ICD-10-CM

## 2021-09-07 DIAGNOSIS — O3110X0 Continuing pregnancy after spontaneous abortion of one fetus or more, unspecified trimester, not applicable or unspecified: Secondary | ICD-10-CM

## 2021-09-07 DIAGNOSIS — Z3A27 27 weeks gestation of pregnancy: Secondary | ICD-10-CM

## 2021-09-07 DIAGNOSIS — Z3482 Encounter for supervision of other normal pregnancy, second trimester: Secondary | ICD-10-CM

## 2021-09-07 DIAGNOSIS — D696 Thrombocytopenia, unspecified: Secondary | ICD-10-CM

## 2021-09-07 DIAGNOSIS — Z98891 History of uterine scar from previous surgery: Secondary | ICD-10-CM

## 2021-09-07 DIAGNOSIS — O99119 Other diseases of the blood and blood-forming organs and certain disorders involving the immune mechanism complicating pregnancy, unspecified trimester: Secondary | ICD-10-CM

## 2021-09-07 NOTE — Addendum Note (Signed)
Addended by: Harrie Jeans on: 09/07/2021 10:41 AM   Modules accepted: Orders

## 2021-09-07 NOTE — Progress Notes (Addendum)
   PRENATAL VISIT NOTE  Subjective:  Ruth Solomon is a 21 y.o. G3P1011 at [redacted]w[redacted]d being seen today for ongoing prenatal care.  She is currently monitored for the following issues for this low-risk pregnancy and has Supervision of normal pregnancy; History of thrombocytopenia; Alpha thalassemia silent carrier; Genetic carrier status; and Fetal cardiac echogenic focus, antepartum, fetus 1 on their problem list.  Patient reports no complaints.  Contractions: Not present. Vag. Bleeding: None.  Movement: Present. Denies leaking of fluid.   The following portions of the patient's history were reviewed and updated as appropriate: allergies, current medications, past family history, past medical history, past social history, past surgical history and problem list.   Objective:   Vitals:   09/07/21 0902  BP: 102/88  Pulse: 84  Weight: 156 lb 9.6 oz (71 kg)    Fetal Status: Fetal Heart Rate (bpm): 136 Fundal Height: 27 cm Movement: Present     General:  Alert, oriented and cooperative. Patient is in no acute distress.  Skin: Skin is warm and dry. No rash noted.   Cardiovascular: Normal heart rate noted  Respiratory: Normal respiratory effort, no problems with respiration noted  Abdomen: Soft, gravid, appropriate for gestational age.  Pain/Pressure: Absent     Pelvic: Cervical exam deferred        Extremities: Normal range of motion.  Edema: None  Mental Status: Normal mood and affect. Normal behavior. Normal judgment and thought content.   Assessment and Plan:  Pregnancy: G3P1011 at [redacted]w[redacted]d 1. Encounter for supervision of other normal pregnancy in second trimester --Anticipatory guidance about next visits/weeks of pregnancy given.   2. Benign gestational thrombocytopenia, antepartum (HCC) --Plts 95 on 07/13/21, repeat with GTT today  3. History of cesarean section --Desires VBAC if platelets remain high enough for epidural, otherwise is Ok with repeat cesarean --CBC today --Consider  VBAC consent at MD visit  4. Vanishing twin syndrome   5. [redacted] weeks gestation of pregnancy   Preterm labor symptoms and general obstetric precautions including but not limited to vaginal bleeding, contractions, leaking of fluid and fetal movement were reviewed in detail with the patient. Please refer to After Visit Summary for other counseling recommendations.   Return for As scheduled.  Future Appointments  Date Time Provider Department Center  09/27/2021 11:30 AM Jerene Bears, MD [facility]  10/13/2021 11:30 AM Jerene Bears, MD [facility]  10/25/2021  1:15 PM Jerene Bears, MD [facility]  11/08/2021 11:45 AM Jerene Bears, MD [facility]  11/16/2021 10:15 AM Hurshel Party, CNM [facility]  11/22/2021 11:15 AM Jerene Bears, MD [facility]  11/29/2021 11:15 AM Jerene Bears, MD [facility]  12/09/2021 10:45 AM Jerene Bears, MD [facility]    Sharen Counter, CNM

## 2021-09-08 LAB — GLUCOSE TOLERANCE, 2 HOURS W/ 1HR
Glucose, 1 hour: 136 mg/dL (ref 70–179)
Glucose, 2 hour: 104 mg/dL (ref 70–152)
Glucose, Fasting: 65 mg/dL — ABNORMAL LOW (ref 70–91)

## 2021-09-08 LAB — CBC
Hematocrit: 34.7 % (ref 34.0–46.6)
Hemoglobin: 11.5 g/dL (ref 11.1–15.9)
MCH: 26.7 pg (ref 26.6–33.0)
MCHC: 33.1 g/dL (ref 31.5–35.7)
MCV: 81 fL (ref 79–97)
Platelets: 110 10*3/uL — ABNORMAL LOW (ref 150–450)
RBC: 4.3 x10E6/uL (ref 3.77–5.28)
RDW: 12.8 % (ref 11.7–15.4)
WBC: 9.3 10*3/uL (ref 3.4–10.8)

## 2021-09-08 LAB — HIV ANTIBODY (ROUTINE TESTING W REFLEX): HIV Screen 4th Generation wRfx: NONREACTIVE

## 2021-09-08 LAB — RPR: RPR Ser Ql: NONREACTIVE

## 2021-09-27 ENCOUNTER — Ambulatory Visit (INDEPENDENT_AMBULATORY_CARE_PROVIDER_SITE_OTHER): Payer: Medicaid Other | Admitting: Obstetrics & Gynecology

## 2021-09-27 VITALS — BP 121/72 | HR 102 | Wt 158.2 lb

## 2021-09-27 DIAGNOSIS — Z3A30 30 weeks gestation of pregnancy: Secondary | ICD-10-CM

## 2021-09-27 DIAGNOSIS — Z3483 Encounter for supervision of other normal pregnancy, third trimester: Secondary | ICD-10-CM

## 2021-09-27 DIAGNOSIS — O35BXX1 Maternal care for other (suspected) fetal abnormality and damage, fetal cardiac anomalies, fetus 1: Secondary | ICD-10-CM

## 2021-09-27 DIAGNOSIS — D563 Thalassemia minor: Secondary | ICD-10-CM

## 2021-09-27 DIAGNOSIS — Z862 Personal history of diseases of the blood and blood-forming organs and certain disorders involving the immune mechanism: Secondary | ICD-10-CM

## 2021-09-27 NOTE — Progress Notes (Signed)
   PRENATAL VISIT NOTE  Subjective:  Ruth Solomon is a 21 y.o. G3P1011 at [redacted]w[redacted]d being seen today for ongoing prenatal care.  She is currently monitored for the following issues for this low-risk pregnancy and has Supervision of normal pregnancy; History of thrombocytopenia; Alpha thalassemia silent carrier; Genetic carrier status; and Fetal cardiac echogenic focus, antepartum, fetus 1 on their problem list.  Patient reports no complaints.  Contractions: Not present. Vag. Bleeding: None.  Movement: Present. Denies leaking of fluid.   The following portions of the patient's history were reviewed and updated as appropriate: allergies, current medications, past family history, past medical history, past social history, past surgical history and problem list.   Objective:   Vitals:   09/27/21 1145  BP: 121/72  Pulse: (!) 102  Weight: 158 lb 3.2 oz (71.8 kg)    Fetal Status: Fetal Heart Rate (bpm): 146   Movement: Present     General:  Alert, oriented and cooperative. Patient is in no acute distress.  Skin: Skin is warm and dry. No rash noted.   Cardiovascular: Normal heart rate noted  Respiratory: Normal respiratory effort, no problems with respiration noted  Abdomen: Soft, gravid, appropriate for gestational age.  Pain/Pressure: Absent     Pelvic: Cervical exam deferred        Extremities: Normal range of motion.  Edema: None  Mental Status: Normal mood and affect. Normal behavior. Normal judgment and thought content.   Assessment and Plan:  Pregnancy: G3P1011 at [redacted]w[redacted]d 1. Encounter for supervision of other normal pregnancy in third trimester - on PNV - recheck 2 weeks  2. [redacted] weeks gestation of pregnancy  3. History of thrombocytopenia - last plt ct was 110K - pt unsure if wants VBAC or repeat c section.  Currently, feels if goes into labor before 39 weeks, would VBAC.  4. Fetal cardiac echogenic focus, antepartum, fetus 1  5. Alpha thalassemia silent carrier - FOB did  complete testing.  Sample damaged.  Repeat testing recommended.    Preterm labor symptoms and general obstetric precautions including but not limited to vaginal bleeding, contractions, leaking of fluid and fetal movement were reviewed in detail with the patient. Please refer to After Visit Summary for other counseling recommendations.   Return in about 2 weeks (around 10/11/2021).  Future Appointments  Date Time Provider Department Center  10/13/2021 11:30 AM Jerene Bears, MD [facility]  10/25/2021  1:15 PM Jerene Bears, MD [facility]  11/08/2021 11:45 AM Jerene Bears, MD [facility]  11/16/2021 10:15 AM Hurshel Party, CNM [facility]  11/22/2021 11:15 AM Jerene Bears, MD [facility]  11/29/2021 11:15 AM Jerene Bears, MD [facility]  12/08/2021 11:30 AM Jerene Bears, MD [facility]    Jerene Bears, MD

## 2021-10-12 ENCOUNTER — Encounter (HOSPITAL_BASED_OUTPATIENT_CLINIC_OR_DEPARTMENT_OTHER): Payer: Medicaid Other | Admitting: Obstetrics & Gynecology

## 2021-10-13 ENCOUNTER — Ambulatory Visit (INDEPENDENT_AMBULATORY_CARE_PROVIDER_SITE_OTHER): Payer: Medicaid Other | Admitting: Obstetrics & Gynecology

## 2021-10-13 VITALS — BP 134/65 | HR 82 | Wt 165.2 lb

## 2021-10-13 DIAGNOSIS — O99113 Other diseases of the blood and blood-forming organs and certain disorders involving the immune mechanism complicating pregnancy, third trimester: Secondary | ICD-10-CM | POA: Diagnosis not present

## 2021-10-13 DIAGNOSIS — Z3483 Encounter for supervision of other normal pregnancy, third trimester: Secondary | ICD-10-CM

## 2021-10-13 DIAGNOSIS — O35BXX1 Maternal care for other (suspected) fetal abnormality and damage, fetal cardiac anomalies, fetus 1: Secondary | ICD-10-CM

## 2021-10-13 DIAGNOSIS — Z31440 Encounter of male for testing for genetic disease carrier status for procreative management: Secondary | ICD-10-CM

## 2021-10-13 DIAGNOSIS — D563 Thalassemia minor: Secondary | ICD-10-CM

## 2021-10-13 DIAGNOSIS — Z3A32 32 weeks gestation of pregnancy: Secondary | ICD-10-CM | POA: Diagnosis not present

## 2021-10-13 DIAGNOSIS — Z862 Personal history of diseases of the blood and blood-forming organs and certain disorders involving the immune mechanism: Secondary | ICD-10-CM | POA: Diagnosis not present

## 2021-10-13 NOTE — Progress Notes (Signed)
   PRENATAL VISIT NOTE  Subjective:  Ruth Solomon is a 21 y.o. G3P1011 at [redacted]w[redacted]d being seen today for ongoing prenatal care.  She is currently monitored for the following issues for this low-risk pregnancy and has Supervision of normal pregnancy; History of thrombocytopenia; Alpha thalassemia silent carrier; Genetic carrier status; and Fetal cardiac echogenic focus, antepartum, fetus 1 on their problem list.  Patient reports no complaints.  Contractions: Not present. Vag. Bleeding: None.  Movement: Present. Denies leaking of fluid.   The following portions of the patient's history were reviewed and updated as appropriate: allergies, current medications, past family history, past medical history, past social history, past surgical history and problem list.   Objective:   Vitals:   10/13/21 1137  BP: 134/65  Pulse: 82  Weight: 165 lb 3.2 oz (74.9 kg)    Fetal Status: Fetal Heart Rate (bpm): 152   Movement: Present     General:  Alert, oriented and cooperative. Patient is in no acute distress.  Skin: Skin is warm and dry. No rash noted.   Cardiovascular: Normal heart rate noted  Respiratory: Normal respiratory effort, no problems with respiration noted  Abdomen: Soft, gravid, appropriate for gestational age.  Pain/Pressure: Present     Pelvic: Cervical exam deferred        Extremities: Normal range of motion.  Edema: None  Mental Status: Normal mood and affect. Normal behavior. Normal judgment and thought content.   Assessment and Plan:  Pregnancy: G3P1011 at [redacted]w[redacted]d 1. [redacted] weeks gestation of pregnancy - recheck 2 weeks - pt still has not decided about VBAC vs repeat c section.  Discussed today. - on PNV  2. Encounter for supervision of other normal pregnancy in third trimester  3. Encounter of female for testing for genetic disease carrier status for procreative management - FOB did testing but sample was damaged.  Will order repeat testing today.  He does desire to have this  done. - [facility] [employer]  4. Alpha thalassemia silent carrier  5. History of thrombocytopenia - plan to repeat about 36 weeks  6. Fetal cardiac echogenic focus, antepartum, fetus 1  Preterm labor symptoms and general obstetric precautions including but not limited to vaginal bleeding, contractions, leaking of fluid and fetal movement were reviewed in detail with the patient. Please refer to After Visit Summary for other counseling recommendations.   Return in about 2 weeks (around 10/27/2021).  Future Appointments  Date Time Provider Department Center  10/25/2021  1:15 PM Jerene Bears, MD [facility]  11/08/2021 11:45 AM Jerene Bears, MD [facility]  11/16/2021 10:15 AM Hurshel Party, CNM [facility]  11/22/2021 11:15 AM Jerene Bears, MD [facility]  11/29/2021 11:15 AM Jerene Bears, MD [facility]  12/08/2021 11:30 AM Jerene Bears, MD [facility]    Jerene Bears, MD

## 2021-10-23 ENCOUNTER — Inpatient Hospital Stay (HOSPITAL_COMMUNITY)
Admission: AD | Admit: 2021-10-23 | Discharge: 2021-10-28 | DRG: 786 | Disposition: A | Discharge status: Home / Self Care | Payer: Medicaid Other | Attending: Family Medicine | Admitting: Family Medicine

## 2021-10-23 ENCOUNTER — Inpatient Hospital Stay (HOSPITAL_BASED_OUTPATIENT_CLINIC_OR_DEPARTMENT_OTHER): Payer: Medicaid Other

## 2021-10-23 ENCOUNTER — Other Ambulatory Visit: Payer: Self-pay

## 2021-10-23 ENCOUNTER — Encounter (HOSPITAL_COMMUNITY): Payer: Self-pay | Admitting: Obstetrics and Gynecology

## 2021-10-23 DIAGNOSIS — O42913 Preterm premature rupture of membranes, unspecified as to length of time between rupture and onset of labor, third trimester: Principal | ICD-10-CM | POA: Diagnosis present

## 2021-10-23 DIAGNOSIS — Z98891 History of uterine scar from previous surgery: Secondary | ICD-10-CM

## 2021-10-23 DIAGNOSIS — O42113 Preterm premature rupture of membranes, onset of labor more than 24 hours following rupture, third trimester: Secondary | ICD-10-CM

## 2021-10-23 DIAGNOSIS — Z3A34 34 weeks gestation of pregnancy: Secondary | ICD-10-CM | POA: Diagnosis not present

## 2021-10-23 DIAGNOSIS — O368230 Fetal anemia and thrombocytopenia, third trimester, not applicable or unspecified: Secondary | ICD-10-CM | POA: Diagnosis not present

## 2021-10-23 DIAGNOSIS — O4593 Premature separation of placenta, unspecified, third trimester: Secondary | ICD-10-CM | POA: Diagnosis present

## 2021-10-23 DIAGNOSIS — O99119 Other diseases of the blood and blood-forming organs and certain disorders involving the immune mechanism complicating pregnancy, unspecified trimester: Secondary | ICD-10-CM

## 2021-10-23 DIAGNOSIS — O9902 Anemia complicating childbirth: Secondary | ICD-10-CM | POA: Diagnosis not present

## 2021-10-23 DIAGNOSIS — D563 Thalassemia minor: Secondary | ICD-10-CM | POA: Diagnosis present

## 2021-10-23 DIAGNOSIS — Z3A33 33 weeks gestation of pregnancy: Secondary | ICD-10-CM

## 2021-10-23 DIAGNOSIS — O9912 Other diseases of the blood and blood-forming organs and certain disorders involving the immune mechanism complicating childbirth: Secondary | ICD-10-CM | POA: Diagnosis present

## 2021-10-23 DIAGNOSIS — D649 Anemia, unspecified: Secondary | ICD-10-CM | POA: Diagnosis not present

## 2021-10-23 DIAGNOSIS — O4290 Premature rupture of membranes, unspecified as to length of time between rupture and onset of labor, unspecified weeks of gestation: Secondary | ICD-10-CM | POA: Diagnosis present

## 2021-10-23 DIAGNOSIS — O34211 Maternal care for low transverse scar from previous cesarean delivery: Secondary | ICD-10-CM

## 2021-10-23 DIAGNOSIS — Z148 Genetic carrier of other disease: Secondary | ICD-10-CM | POA: Diagnosis not present

## 2021-10-23 DIAGNOSIS — D6959 Other secondary thrombocytopenia: Secondary | ICD-10-CM | POA: Diagnosis present

## 2021-10-23 DIAGNOSIS — D696 Thrombocytopenia, unspecified: Secondary | ICD-10-CM

## 2021-10-23 LAB — CBC
HCT: 34.4 % — ABNORMAL LOW (ref 36.0–46.0)
Hemoglobin: 11.2 g/dL — ABNORMAL LOW (ref 12.0–15.0)
MCH: 26.2 pg (ref 26.0–34.0)
MCHC: 32.6 g/dL (ref 30.0–36.0)
MCV: 80.4 fL (ref 80.0–100.0)
Platelets: 101 10*3/uL — ABNORMAL LOW (ref 150–400)
RBC: 4.28 MIL/uL (ref 3.87–5.11)
RDW: 13.4 % (ref 11.5–15.5)
WBC: 10.7 10*3/uL — ABNORMAL HIGH (ref 4.0–10.5)
nRBC: 0 % (ref 0.0–0.2)

## 2021-10-23 LAB — RAPID URINE DRUG SCREEN, HOSP PERFORMED
Amphetamines: NOT DETECTED
Barbiturates: NOT DETECTED
Benzodiazepines: NOT DETECTED
Cocaine: NOT DETECTED
Opiates: NOT DETECTED
Tetrahydrocannabinol: NOT DETECTED

## 2021-10-23 LAB — URINALYSIS, ROUTINE W REFLEX MICROSCOPIC
Bacteria, UA: NONE SEEN
Bilirubin Urine: NEGATIVE
Glucose, UA: NEGATIVE mg/dL
Hgb urine dipstick: NEGATIVE
Ketones, ur: NEGATIVE mg/dL
Leukocytes,Ua: NEGATIVE
Nitrite: NEGATIVE
Protein, ur: 30 mg/dL — AB
Specific Gravity, Urine: 1.035 — ABNORMAL HIGH (ref 1.005–1.030)
pH: 5 (ref 5.0–8.0)

## 2021-10-23 LAB — POCT FERN TEST: POCT Fern Test: POSITIVE

## 2021-10-23 MED ORDER — ZOLPIDEM TARTRATE 5 MG PO TABS: 5.0000 mg | ORAL_TABLET | Freq: Every evening | ORAL | Status: DC | PRN

## 2021-10-23 MED ORDER — LACTATED RINGERS IV SOLN: INTRAVENOUS | Status: DC

## 2021-10-23 MED ORDER — BETAMETHASONE SOD PHOS & ACET 6 (3-3) MG/ML IJ SUSP
12.0000 mg | INTRAMUSCULAR | Status: AC
  Administered 2021-10-23 – 2021-10-24 (×2): 12 mg via INTRAMUSCULAR
  Filled 2021-10-23 (×2): qty 5

## 2021-10-23 MED ORDER — ACETAMINOPHEN 325 MG PO TABS: 650.0000 mg | ORAL_TABLET | ORAL | Status: DC | PRN

## 2021-10-23 MED ORDER — CALCIUM CARBONATE ANTACID 500 MG PO CHEW: 2.0000 | CHEWABLE_TABLET | ORAL | Status: DC | PRN

## 2021-10-23 MED ORDER — SODIUM CHLORIDE 0.9 % IV SOLN
2.0000 g | Freq: Four times a day (QID) | INTRAVENOUS | Status: AC
  Administered 2021-10-23 – 2021-10-25 (×8): 2 g via INTRAVENOUS
  Filled 2021-10-23 (×8): qty 2000

## 2021-10-23 MED ORDER — DOCUSATE SODIUM 100 MG PO CAPS
100.0000 mg | ORAL_CAPSULE | Freq: Every day | ORAL | Status: DC
Start: 2021-10-24 — End: 2021-10-26
  Administered 2021-10-24 – 2021-10-25 (×2): 100 mg via ORAL
  Filled 2021-10-23 (×2): qty 1

## 2021-10-23 MED ORDER — AZITHROMYCIN 250 MG PO TABS
1000.0000 mg | ORAL_TABLET | Freq: Once | ORAL | Status: AC
  Administered 2021-10-23: 1000 mg via ORAL
  Filled 2021-10-23: qty 4

## 2021-10-23 MED ORDER — PRENATAL MULTIVITAMIN CH
1.0000 | ORAL_TABLET | Freq: Every day | ORAL | Status: DC
  Administered 2021-10-24 – 2021-10-25 (×2): 1 via ORAL
  Filled 2021-10-23 (×2): qty 1

## 2021-10-23 MED ORDER — AMOXICILLIN 500 MG PO CAPS
500.0000 mg | ORAL_CAPSULE | Freq: Three times a day (TID) | ORAL | Status: DC
  Administered 2021-10-25 – 2021-10-26 (×2): 500 mg via ORAL
  Filled 2021-10-23 (×2): qty 1

## 2021-10-23 NOTE — H&P (Signed)
FACULTY PRACTICE ANTEPARTUM ADMISSION HISTORY AND PHYSICAL NOTE   History of Present Illness: Ruth Solomon is a 21 y.o. G3P1011 at [redacted]w[redacted]d admitted for PROM.  Pt noted a gush of fluid this AM at 0500 and has had continued leakage.  Pt went about her day and attended her baby shower before coming to be evaluated in the MAU.  Her prenatal care has otherwise been complicated by gestational thrombocytopenia and hx of previous cesarean section.  Pt has considering VBAC if she can get an epidural 2/2 platelet status. Patient reports the fetal movement as active. Patient reports uterine contraction  activity as none. Patient reports  vaginal bleeding as none. Patient describes fluid per vagina as continued leakage. Fetal presentation is cephalic.  Patient Active Problem List   Diagnosis Date Noted   Premature rupture of membranes 10/23/2021   Fetal cardiac echogenic focus, antepartum, fetus 1 07/12/2021   Genetic carrier status 06/16/2021   Alpha thalassemia silent carrier 06/08/2021   Supervision of normal pregnancy 05/13/2021   History of thrombocytopenia 05/13/2021    Past Medical History:  Diagnosis Date   Benign gestational thrombocytopenia in third trimester (HCC) 04/10/2019   [x ] f/u heme referral: done 2/24: fu heme onc labs. Will see 6wks PP  04/10/19: verified by microscope  CBC Latest Ref Rng & Units 04/10/2019 03/25/2019 12/06/2018  WBC 3.4 - 10.8 x10E3/uL 10.1 - -  Hemoglobin 11.1 - 15.9 g/dL 95.1 88.4 16.6  Hematocrit 34.0 - 46.6 % 33.9(L) 37 37  Platelets 150 - 450 x10E3/uL 92(LL) - 103      Delivery of pregnancy by cesarean section 06/01/2019   Postpartum anemia 06/01/2019    Past Surgical History:  Procedure Laterality Date   CESAREAN SECTION N/A 05/30/2019   Procedure: CESAREAN SECTION;  Surgeon: Adam Phenix, MD;  Location: [facility] LD [facility]S;  Service: Obstetrics;  Laterality: N/A;   FOREIGN BODY REMOVAL Left    sewing needle removed from big toe    OB History  Gravida  Para Term Preterm AB Living  3 1 1   1 1   SAB IAB Ectopic Multiple Live Births    1   0 1    # Outcome Date GA Lbr Len/2nd Weight Sex Delivery Anes PTL Lv  3 Current           2 IAB 08/2020          1 Term 05/30/19 [redacted]w[redacted]d  2455 g M CS-LTranv Gen  LIV    Social History   Socioeconomic History   Marital status: Single    Spouse name: Not on file   Number of children: Not on file   Years of education: Not on file   Highest education level: Not on file  Occupational History   Not on file  Tobacco Use   Smoking status: Never   Smokeless tobacco: Never  Vaping Use   Vaping Use: Never used  Substance and Sexual Activity   Alcohol use: Never   Drug use: Not Currently    Types: Marijuana    Comment: last use a few months ago   Sexual activity: Yes    Birth control/protection: None  Other Topics Concern   Not on file  Social History Narrative   Not on file   Social Determinants of Health   Financial Resource Strain: Low Risk  (05/12/2021)   Overall Financial Resource Strain (CARDIA)    Difficulty of Paying Living Expenses: Not hard at all  Food Insecurity: No Food  Insecurity (05/12/2021)   Hunger Vital Sign    Worried About Running Out of Food in the Last Year: Never true    [place] in the Last Year: Never true  Transportation Needs: No Transportation Needs (05/12/2021)   PRAPARE - Hydrologist (Medical): No    Lack of Transportation (Non-Medical): No  Physical Activity: Inactive (05/12/2021)   Exercise Vital Sign    Days of Exercise per Week: 0 days    Minutes of Exercise per Session: 0 min  Stress: No Stress Concern Present (05/12/2021)   [place]    Feeling of Stress : Only a little  Social Connections: Moderately Integrated (05/12/2021)   Social Connection and Isolation Panel [NHANES]    Frequency of Communication with Friends and Family: More than three times a week     Frequency of Social Gatherings with Friends and Family: Once a week    Attends Religious Services: 1 to 4 times per year    Active Member of [employer] or Organizations: No    Attends Music therapist: Never    Marital Status: Living with partner    Family History  Problem Relation Age of Onset   Healthy Mother    Hypertension Father    Healthy Father    Diabetes Maternal Grandmother    Hypertension Paternal Grandmother    Diabetes Paternal Grandmother    Hypertension Paternal Grandfather    Diabetes Paternal Grandfather     No Known Allergies  Medications Prior to Admission  Medication Sig Dispense Refill Last Dose   Prenatal Vit-Fe Fumarate-FA (MULTIVITAMIN-PRENATAL) 27-0.8 MG TABS tablet Take 1 tablet by mouth daily at 12 noon. 30 tablet 0 10/23/2021    Review of Systems - History obtained from the patient  Vitals:  BP 117/64 (BP Location: Right Arm)   Pulse 95   Temp 98.1 F (36.7 C) (Oral)   Resp 15   Ht 5\' 2"  (1.575 m)   Wt 74.4 kg   LMP 03/01/2021   SpO2 100%   BMI 30.01 kg/m  Physical Examination: CONSTITUTIONAL: Well-developed, well-nourished female in no acute distress.  HENT:  Normocephalic, atraumatic, External right and left ear normal. Oropharynx is clear and moist EYES: Conjunctivae and EOM are normal. Pupils are equal, round, and reactive to light. No scleral icterus.  NECK: Normal range of motion, supple, no masses SKIN: Skin is warm and dry. No rash noted. Not diaphoretic. No erythema. No pallor. [place]: Alert and oriented to person, place, and time. Normal reflexes, muscle tone coordination. No cranial nerve deficit noted. PSYCHIATRIC: Normal mood and affect. Normal behavior. Normal judgment and thought content. CARDIOVASCULAR: Normal heart rate noted, regular rhythm RESPIRATORY: Effort and breath sounds normal, no problems with respiration noted ABDOMEN: Soft, nontender, nondistended, gravid. MUSCULOSKELETAL: Normal range of motion.  No edema and no tenderness. 2+ distal pulses.  Cervix: Not evaluated due to PROM status. Membranes:intact, clear fluid Fetal Monitoring:Baseline: 130 bpm, Variability: Good {> 6 bpm), Accelerations: Reactive, and Decelerations: Absent Tocometer: irregular contractions  Labs:  Results for orders placed or performed during the hospital encounter of 10/23/21 (from the past 24 hour(s))  Urinalysis, Routine w reflex microscopic Urine, Clean Catch   Collection Time: 10/23/21  9:41 PM  Result Value Ref Range   Color, Urine AMBER (A) YELLOW   APPearance HAZY (A) CLEAR   Specific Gravity, Urine 1.035 (H) 1.005 - 1.030   pH 5.0 5.0 - 8.0  Glucose, UA NEGATIVE NEGATIVE mg/dL   Hgb urine dipstick NEGATIVE NEGATIVE   Bilirubin Urine NEGATIVE NEGATIVE   Ketones, ur NEGATIVE NEGATIVE mg/dL   Protein, ur 30 (A) NEGATIVE mg/dL   Nitrite NEGATIVE NEGATIVE   Leukocytes,Ua NEGATIVE NEGATIVE   RBC / HPF 6-10 0 - 5 RBC/hpf   WBC, UA 6-10 0 - 5 WBC/hpf   Bacteria, UA NONE SEEN NONE SEEN   Squamous Epithelial / LPF 0-5 0 - 5   Mucus PRESENT    Hyaline Casts, UA PRESENT   Fern Test   Collection Time: 10/23/21  9:47 PM  Result Value Ref Range   POCT Fern Test Positive = ruptured amniotic membanes     Imaging Studies: No results found.   Assessment and Plan: Patient Active Problem List   Diagnosis Date Noted   Premature rupture of membranes 10/23/2021   Fetal cardiac echogenic focus, antepartum, fetus 1 07/12/2021   Genetic carrier status 06/16/2021   Alpha thalassemia silent carrier 06/08/2021   Supervision of normal pregnancy 05/13/2021   History of thrombocytopenia 05/13/2021   Admit to Antenatal Betamethasone x 2 doses Will check UDS and UA Will start latency abx. NICU consult Will recheck presentation if she does progress in preterm labor to determine route of delivery Routine antenatal care Discussed in great detail VBAC versus repeat c section to discuss trial of labor after  cesarean section (TOLAC) versus elective repeat cesarean delivery (ERCD). The following risks were discussed with the patient.  Risk of uterine rupture at term is 0.78 percent with TOLAC and 0.22 percent with ERCD. 1 in 10 uterine ruptures will result in neonatal death or neurological injury. The benefits of a trial of labor after cesarean (TOLAC) resulting in a vaginal birth after cesarean (VBAC) include the following: shorter length of hospital stay and postpartum recovery (in most cases); fewer complications, such as postpartum fever, wound or uterine infection, thromboembolism (blood clots in the leg or lung), need for blood transfusion and fewer neonatal breathing problems. The risks of an attempted VBAC or TOLAC include the following: Risk of failed trial of labor after cesarean (TOLAC) without a vaginal birth after cesarean (VBAC) resulting in repeat cesarean delivery (RCD) in about 20 to 40 percent of women who attempt VBAC.  Risk of rupture of uterus resulting in an emergency cesarean delivery. The risk of uterine rupture may be related in part to the type of uterine incision made during the first cesarean delivery. A previous transverse uterine incision has the lowest risk of rupture (0.2 to 1.5 percent risk). Vertical or T-shaped uterine incisions have a higher risk of uterine rupture (4 to 9 percent risk)The risk of fetal death is very low with both VBAC and elective repeat cesarean delivery (ERCD), but the likelihood of fetal death is higher with VBAC than with ERCD. Maternal death is very rare with either type of delivery. The risks of an elective repeat cesarean delivery (ERCD) were reviewed with the patient including but not limited to: 03/998 risk of uterine rupture which could have serious consequences, bleeding which may require transfusion; infection which may require antibiotics; injury to bowel, bladder or other surrounding organs (bowel, bladder, ureters); injury to the fetus; need for  additional procedures including hysterectomy in the event of a life-threatening hemorrhage; thromboembolic phenomenon; abnormal placentation; incisional problems; death and other postoperative or anesthesia complications.    Pt will need to sign VBAC consent prior to delivery, per pt her decision will be influenced by her platelet status and  whether or not she can receive an epidural.  Lynnda Shields, MD Faculty attending, Center for [facility]

## 2021-10-23 NOTE — MAU Note (Signed)
..  Ruth Solomon is a 21 y.o. at [redacted]w[redacted]d here in MAU reporting: a gush of fluid this morning at 8am and has been leaking since, the fluid is clear with mucous pieces. Reports some cramping.  +FM but not as much   Onset of complaint: sudden this morning Pain score: 2/10 Vitals:   10/23/21 2118  BP: 117/64  Pulse: 95  Resp: 15  Temp: 98.1 F (36.7 C)  SpO2: 100%     FHT:138 Lab orders placed from triage:  ua

## 2021-10-24 MED ORDER — SODIUM CHLORIDE 0.9% FLUSH
3.0000 mL | INTRAVENOUS | Status: DC | PRN
Start: 2021-10-24 — End: 2021-10-26
  Administered 2021-10-25: 3 mL via INTRAVENOUS

## 2021-10-24 NOTE — Progress Notes (Signed)
FACULTY PRACTICE ANTEPARTUM PROGRESS NOTE  Ruth Solomon is a 21 y.o. G3P1011 at [redacted]w[redacted]d who is admitted for PROM.  Estimated Date of Delivery: 12/06/21 Fetal presentation is cephalic.  Length of Stay:  1 Days. Admitted 10/23/2021  Subjective: Pt seen this morning.  She is comfortable and notes slight leakage of fluid.  Nursing called this am with report of pink tinge to fluid.  Pt is s/p BMZ x 1.  Currently she is leaning toward repeat cesarean section at 34 weeks. Patient reports normal fetal movement.  She denies uterine contractions, denies bleeding per vagina.  Vitals:  Blood pressure (!) 113/59, pulse 92, temperature (!) 97.5 F (36.4 C), temperature source Oral, resp. rate 14, height 5\' 2"  (1.575 m), weight 74.4 kg, last menstrual period 03/01/2021, SpO2 99 %. Physical Examination: CONSTITUTIONAL: Well-developed, well-nourished female in no acute distress.  HENT:  Normocephalic, atraumatic, External right and left ear normal. Oropharynx is clear and moist EYES: Conjunctivae and EOM are normal.  NECK: Normal range of motion, supple, no masses. SKIN: Skin is warm and dry. No rash noted. Not diaphoretic. No erythema. No pallor. NEUROLGIC: Alert and oriented to person, place, and time. Normal reflexes, muscle tone coordination. No cranial nerve deficit noted. PSYCHIATRIC: Normal mood and affect. Normal behavior. Normal judgment and thought content. CARDIOVASCULAR: Normal heart rate noted, regular rhythm RESPIRATORY: Effort and breath sounds normal, no problems with respiration noted MUSCULOSKELETAL: Normal range of motion. No edema and no tenderness.  No calf pain bilaterally ABDOMEN: Soft, nontender, nondistended, gravid. CERVIX: deferred  Fetal monitoring: FHR: 110 bpm, Variability: marked, Accelerations: Present, Decelerations: Absent , category 1 strip reactive Uterine activity: irritability   Results for orders placed or performed during the hospital encounter of 10/23/21 (from  the past 48 hour(s))  Urinalysis, Routine w reflex microscopic Urine, Clean Catch     Status: Abnormal   Collection Time: 10/23/21  9:41 PM  Result Value Ref Range   Color, Urine AMBER (A) YELLOW    Comment: BIOCHEMICALS MAY BE AFFECTED BY COLOR   APPearance HAZY (A) CLEAR   Specific Gravity, Urine 1.035 (H) 1.005 - 1.030   pH 5.0 5.0 - 8.0   Glucose, UA NEGATIVE NEGATIVE mg/dL   Hgb urine dipstick NEGATIVE NEGATIVE   Bilirubin Urine NEGATIVE NEGATIVE   Ketones, ur NEGATIVE NEGATIVE mg/dL   Protein, ur 30 (A) NEGATIVE mg/dL   Nitrite NEGATIVE NEGATIVE   Leukocytes,Ua NEGATIVE NEGATIVE   RBC / HPF 6-10 0 - 5 RBC/hpf   WBC, UA 6-10 0 - 5 WBC/hpf   Bacteria, UA NONE SEEN NONE SEEN   Squamous Epithelial / LPF 0-5 0 - 5   Mucus PRESENT    Hyaline Casts, UA PRESENT     Comment: Performed at [facility], [address], [place], [place]  Rapid urine drug screen (hospital performed)     Status: None   Collection Time: 10/23/21  9:41 PM  Result Value Ref Range   Opiates NONE DETECTED NONE DETECTED   Cocaine NONE DETECTED NONE DETECTED   Benzodiazepines NONE DETECTED NONE DETECTED   Amphetamines NONE DETECTED NONE DETECTED   Tetrahydrocannabinol NONE DETECTED NONE DETECTED   Barbiturates NONE DETECTED NONE DETECTED    Comment: (NOTE) DRUG SCREEN FOR MEDICAL PURPOSES ONLY.  IF CONFIRMATION IS NEEDED FOR ANY PURPOSE, NOTIFY LAB WITHIN 5 DAYS.  LOWEST DETECTABLE LIMITS FOR URINE DRUG SCREEN Drug Class  Cutoff (ng/mL) Amphetamine and metabolites    1000 Barbiturate and metabolites    200 Benzodiazepine                 200 Tricyclics and metabolites     300 Opiates and metabolites        300 Cocaine and metabolites        300 THC                            50 Performed at [facility], [address] [place], [place] [address]   Crist Fat Test     Status: Abnormal   Collection Time: 10/23/21  9:47 PM  Result Value Ref Range   POCT Fern Test  Positive = ruptured amniotic membanes   CBC     Status: Abnormal   Collection Time: 10/23/21 10:42 PM  Result Value Ref Range   WBC 10.7 (H) 4.0 - 10.5 K/uL   RBC 4.28 3.87 - 5.11 MIL/uL   Hemoglobin 11.2 (L) 12.0 - 15.0 g/dL   HCT 25.8 (L) 52.7 - 78.2 %   MCV 80.4 80.0 - 100.0 fL   MCH 26.2 26.0 - 34.0 pg   MCHC 32.6 30.0 - 36.0 g/dL   RDW 42.3 53.6 - 14.4 %   Platelets 101 (L) 150 - 400 K/uL    Comment: REPEATED TO VERIFY   nRBC 0.0 0.0 - 0.2 %    Comment: Performed at [facility], [address] [place], [place] [address]  Type and screen [facility]     Status: None   Collection Time: 10/23/21 10:42 PM  Result Value Ref Range   ABO/RH(D) O POS    Antibody Screen NEG    Sample Expiration      10/26/2021,2359 Performed at [facility], [address] [place], [place] [address]     I have reviewed the patient's current medications.  ASSESSMENT: Principal Problem:   Premature rupture of membranes   PLAN: Continue latency antibiotics Repeat BMZ at 2240 Check platelets Monday and possibly Tuesday Pt needs to sign TOLAC versus c section form, she currently desires repeat c section NICU consult pending Once pt s/p both doses of BMZ, discuss delivery solution with anesthesia Pt does not desire to prolong pregnancy past 34 weeks with PROM.   Continue routine antenatal care.   Mariel Aloe, MD [facility] Faculty Attending, Center for [facility] 10/24/2021 7:39 AM

## 2021-10-24 NOTE — Consult Note (Signed)
[facility]  Prenatal Consult       10/24/2021  5:48 PM   I was asked by Dr. Donavan Foil to consult on this patient for possible preterm delivery. I had the pleasure of meeting with Ruth Solomon today. She is a 21 yo G3P1011 at [redacted]w[redacted]d who is admitted for PPROM. Pregnancy also complicated by gestational thrombocytopenia. Will likely deliver in the next several days, method of delivery TBD based on plt count.   I explained that the neonatal intensive care team would be present for the delivery and outlined the likely delivery room course for this baby including routine resuscitation and NRP-guided approaches to the treatment of respiratory distress. We discussed other common problems associated with prematurity including respiratory distress syndrome, feeding issues, temperature regulation, and infection risk.  We discussed the average length of stay but I noted that the actual LOS would depend on the severity of problems encountered and response to treatments. We discussed visitation policies and the resources available while her child is in the hospital.  We discussed the importance of good nutrition and various methods of providing nutrition (parenteral hyperalimentation, gavage feedings and/or oral feeding). We discussed the benefits of human milk. I encouraged breast feeding and pumping soon after birth and outlined resources that are available to support breast feeding, and she voiced the desire to breastfeed.  Thank you for involving [place] in the care of this patient. A member of our team will be available should the family have additional questions.  Time for consultation approximately 35 minutes.  Simone Curia, MD Neonatal Medicine

## 2021-10-25 ENCOUNTER — Encounter (HOSPITAL_BASED_OUTPATIENT_CLINIC_OR_DEPARTMENT_OTHER): Payer: Medicaid Other | Admitting: Obstetrics & Gynecology

## 2021-10-25 LAB — CULTURE, OB URINE

## 2021-10-25 LAB — GC/CHLAMYDIA PROBE AMP (~~LOC~~) NOT AT ARMC
Chlamydia: NEGATIVE
Comment: NEGATIVE
Comment: NORMAL
Neisseria Gonorrhea: NEGATIVE

## 2021-10-25 MED ORDER — CEFAZOLIN SODIUM-DEXTROSE 2-4 GM/100ML-% IV SOLN
2.0000 g | INTRAVENOUS | Status: AC
  Administered 2021-10-26: 2 g via INTRAVENOUS
  Filled 2021-10-25: qty 100

## 2021-10-25 MED ORDER — POVIDONE-IODINE 10 % EX SWAB
2.0000 | Freq: Once | CUTANEOUS | Status: AC
  Administered 2021-10-26: 2 via TOPICAL

## 2021-10-25 MED ORDER — LACTATED RINGERS IV SOLN: INTRAVENOUS | Status: DC

## 2021-10-25 MED ORDER — PRENATAL MULTIVITAMIN CH
1.0000 | ORAL_TABLET | Freq: Every day | ORAL | Status: DC
  Filled 2021-10-25 (×3): qty 1

## 2021-10-25 NOTE — Progress Notes (Signed)
Patient ID: Ruth Solomon, female   DOB: 25-Sep-2000, 21 y.o.   MRN: [id number] Patient has considered risks and benefits of TOLAC vs. RCS and she requests scheduled RCS tomorrow.  Adam Phenix, MD 10/25/2021 2:04 PM

## 2021-10-25 NOTE — Progress Notes (Signed)
Patient ID: Ruth Solomon, female   DOB: 2000-04-03, 21 y.o.   MRN: [id number] FACULTY PRACTICE ANTEPARTUM(COMPREHENSIVE) NOTE  Ruth Solomon is a 21 y.o. G3P1011 at [redacted]w[redacted]d by LMP who is admitted for rupture of membranes, PROM.   Fetal presentation is cephalic. Length of Stay:  2  Days  Subjective: Undecided as to mode of delivery Patient reports the fetal movement as active. Patient reports uterine contraction  activity as none. Patient reports  vaginal bleeding as none. Patient describes fluid per vagina as Clear.  Vitals:  Blood pressure (!) 98/45, pulse 86, temperature 97.9 F (36.6 C), temperature source Oral, resp. rate 18, height 5\' 2"  (1.575 m), weight 74.4 kg, last menstrual period 03/01/2021, SpO2 99 %. Physical Examination:  General appearance - alert, well appearing, and in no distress Heart - normal rate and regular rhythm Abdomen - soft, nontender, nondistended Fundal Height:  size equals dates Cervical Exam: Not evaluated. Extremities: extremities normal, atraumatic, no cyanosis or edema and Homans sign is negative, no sign of DVT with  Membranes:ruptured  Fetal Monitoring:   Fetal Heart Rate A   Mode External filed at 10/24/2021 2209  Baseline Rate (A) 125 bpm filed at 10/24/2021 2209  Variability 6-25 BPM filed at 10/24/2021 2209  Accelerations 15 x 15 filed at 10/24/2021 2209  Decelerations None filed at 10/24/2021 2209  Multiple birth? N filed at 10/24/2021 2209    Labs:  No results found for this or any previous visit (from the past 24 hour(s)).   Medications:  Scheduled  amoxicillin  500 mg Oral TID   docusate sodium  100 mg Oral Daily   prenatal multivitamin  1 tablet Oral Q1200   I have reviewed the patient's current medications.  ASSESSMENT: Patient Active Problem List   Diagnosis Date Noted   Premature rupture of membranes 10/23/2021   Fetal cardiac echogenic focus, antepartum, fetus 1 07/12/2021   Genetic carrier status 06/16/2021    Alpha thalassemia silent carrier 06/08/2021   Supervision of normal pregnancy 05/13/2021   History of thrombocytopenia 05/13/2021    PLAN: S/SX PTL or infection reviewed. IOL or RCS tomorrow is anticipated  05/15/2021 10/25/2021,9:59 AM

## 2021-10-25 NOTE — Progress Notes (Deleted)
Subjective:feels well. Baby is in the NICU Postpartum Day 1: Cesarean Delivery Patient reports incisional pain and tolerating PO.    Objective: Vital signs in last 24 hours: Temp:  [97.6 F (36.4 C)-98 F (36.7 C)] 97.9 F (36.6 C) (08/28 0740) Pulse Rate:  [76-92] 86 (08/28 0740) Resp:  [15-18] 18 (08/28 0740) BP: (98-114)/(44-61) 98/45 (08/28 0740) SpO2:  [97 %-100 %] 99 % (08/28 0740)  Physical Exam:  General: alert, cooperative, and no distress Lochia: appropriate Uterine Fundus: firm Incision: healing well, no significant drainage DVT Evaluation: No evidence of DVT seen on physical exam.  Recent Labs    10/23/21 2242  HGB 11.2*  HCT 34.4*    Assessment/Plan: Status post Cesarean section. Doing well postoperatively.  Continue current care.  Scheryl Darter, MD 10/25/2021, 10:22 AM

## 2021-10-26 ENCOUNTER — Inpatient Hospital Stay (HOSPITAL_COMMUNITY): Payer: Medicaid Other | Admitting: Anesthesiology

## 2021-10-26 ENCOUNTER — Encounter (HOSPITAL_COMMUNITY)
Admission: AD | Disposition: A | Discharge status: Home / Self Care | Payer: Self-pay | Source: Home / Self Care | Attending: Obstetrics and Gynecology

## 2021-10-26 ENCOUNTER — Other Ambulatory Visit: Payer: Self-pay

## 2021-10-26 ENCOUNTER — Encounter (HOSPITAL_COMMUNITY): Payer: Self-pay | Admitting: Obstetrics and Gynecology

## 2021-10-26 DIAGNOSIS — D649 Anemia, unspecified: Secondary | ICD-10-CM

## 2021-10-26 DIAGNOSIS — O34211 Maternal care for low transverse scar from previous cesarean delivery: Secondary | ICD-10-CM

## 2021-10-26 DIAGNOSIS — Z3A33 33 weeks gestation of pregnancy: Secondary | ICD-10-CM

## 2021-10-26 DIAGNOSIS — Z3A34 34 weeks gestation of pregnancy: Secondary | ICD-10-CM

## 2021-10-26 DIAGNOSIS — O42913 Preterm premature rupture of membranes, unspecified as to length of time between rupture and onset of labor, third trimester: Secondary | ICD-10-CM

## 2021-10-26 DIAGNOSIS — Z98891 History of uterine scar from previous surgery: Secondary | ICD-10-CM

## 2021-10-26 DIAGNOSIS — O4593 Premature separation of placenta, unspecified, third trimester: Secondary | ICD-10-CM

## 2021-10-26 DIAGNOSIS — O9902 Anemia complicating childbirth: Secondary | ICD-10-CM

## 2021-10-26 DIAGNOSIS — O368230 Fetal anemia and thrombocytopenia, third trimester, not applicable or unspecified: Secondary | ICD-10-CM

## 2021-10-26 LAB — CBC
HCT: 29.3 % — ABNORMAL LOW (ref 36.0–46.0)
HCT: 33.4 % — ABNORMAL LOW (ref 36.0–46.0)
Hemoglobin: 9.5 g/dL — ABNORMAL LOW (ref 12.0–15.0)
Hemoglobin: 10.8 g/dL — ABNORMAL LOW (ref 12.0–15.0)
MCH: 26.1 pg (ref 26.0–34.0)
MCH: 26.3 pg (ref 26.0–34.0)
MCHC: 32.4 g/dL (ref 30.0–36.0)
MCHC: 32.3 g/dL (ref 30.0–36.0)
MCV: 80.5 fL (ref 80.0–100.0)
MCV: 81.5 fL (ref 80.0–100.0)
Platelets: 85 10*3/uL — ABNORMAL LOW (ref 150–400)
Platelets: 96 10*3/uL — ABNORMAL LOW (ref 150–400)
RBC: 3.64 MIL/uL — ABNORMAL LOW (ref 3.87–5.11)
RBC: 4.1 MIL/uL (ref 3.87–5.11)
RDW: 13.8 % (ref 11.5–15.5)
RDW: 14 % (ref 11.5–15.5)
WBC: 9.3 10*3/uL (ref 4.0–10.5)
WBC: 11.4 10*3/uL — ABNORMAL HIGH (ref 4.0–10.5)
nRBC: 0 % (ref 0.0–0.2)
nRBC: 0 % (ref 0.0–0.2)

## 2021-10-26 LAB — PREPARE RBC (CROSSMATCH)

## 2021-10-26 LAB — RPR: RPR Ser Ql: NONREACTIVE

## 2021-10-26 SURGERY — Surgical Case
Anesthesia: Spinal

## 2021-10-26 MED ORDER — SIMETHICONE 80 MG PO CHEW: 80.0000 mg | CHEWABLE_TABLET | ORAL | Status: DC | PRN

## 2021-10-26 MED ORDER — OXYTOCIN-SODIUM CHLORIDE 30-0.9 UT/500ML-% IV SOLN: 2.5000 [IU]/h | INTRAVENOUS | Status: AC

## 2021-10-26 MED ORDER — TRANEXAMIC ACID-NACL 1000-0.7 MG/100ML-% IV SOLN
INTRAVENOUS | Status: AC
  Filled 2021-10-26: qty 100

## 2021-10-26 MED ORDER — IBUPROFEN 600 MG PO TABS
600.0000 mg | ORAL_TABLET | Freq: Four times a day (QID) | ORAL | Status: DC
  Administered 2021-10-27 – 2021-10-28 (×2): 600 mg via ORAL
  Filled 2021-10-26 (×2): qty 1

## 2021-10-26 MED ORDER — SIMETHICONE 80 MG PO CHEW: 80.0000 mg | CHEWABLE_TABLET | Freq: Three times a day (TID) | ORAL | Status: DC

## 2021-10-26 MED ORDER — ZOLPIDEM TARTRATE 5 MG PO TABS: 5.0000 mg | ORAL_TABLET | Freq: Every evening | ORAL | Status: DC | PRN

## 2021-10-26 MED ORDER — ACETAMINOPHEN 10 MG/ML IV SOLN
INTRAVENOUS | Status: AC
  Filled 2021-10-26: qty 100

## 2021-10-26 MED ORDER — SENNOSIDES-DOCUSATE SODIUM 8.6-50 MG PO TABS: 2.0000 | ORAL_TABLET | Freq: Every day | ORAL | Status: DC

## 2021-10-26 MED ORDER — OXYTOCIN-SODIUM CHLORIDE 30-0.9 UT/500ML-% IV SOLN
INTRAVENOUS | Status: AC
  Filled 2021-10-26: qty 500

## 2021-10-26 MED ORDER — KETOROLAC TROMETHAMINE 30 MG/ML IJ SOLN
INTRAMUSCULAR | Status: AC
  Filled 2021-10-26: qty 1

## 2021-10-26 MED ORDER — OXYTOCIN-SODIUM CHLORIDE 30-0.9 UT/500ML-% IV SOLN: 2.5000 [IU]/h | INTRAVENOUS | Status: DC

## 2021-10-26 MED ORDER — ACETAMINOPHEN 10 MG/ML IV SOLN
1000.0000 mg | Freq: Once | INTRAVENOUS | Status: DC | PRN
  Administered 2021-10-26: 1000 mg via INTRAVENOUS

## 2021-10-26 MED ORDER — METOCLOPRAMIDE HCL 5 MG/ML IJ SOLN
INTRAMUSCULAR | Status: AC
  Filled 2021-10-26: qty 2

## 2021-10-26 MED ORDER — PRENATAL MULTIVITAMIN CH
1.0000 | ORAL_TABLET | Freq: Every day | ORAL | Status: DC
  Administered 2021-10-27: 1 via ORAL
  Filled 2021-10-26: qty 1

## 2021-10-26 MED ORDER — COCONUT OIL OIL: 1.0000 | TOPICAL_OIL | Status: DC | PRN

## 2021-10-26 MED ORDER — OXYCODONE HCL 5 MG PO TABS: 5.0000 mg | ORAL_TABLET | ORAL | Status: DC | PRN

## 2021-10-26 MED ORDER — MAGNESIUM HYDROXIDE 400 MG/5ML PO SUSP: 30.0000 mL | ORAL | Status: DC | PRN

## 2021-10-26 MED ORDER — TETANUS-DIPHTH-ACELL PERTUSSIS 5-2.5-18.5 LF-MCG/0.5 IM SUSY: 0.5000 mL | PREFILLED_SYRINGE | Freq: Once | INTRAMUSCULAR | Status: DC

## 2021-10-26 MED ORDER — SOD CITRATE-CITRIC ACID 500-334 MG/5ML PO SOLN
ORAL | Status: AC
  Filled 2021-10-26: qty 30

## 2021-10-26 MED ORDER — CEFAZOLIN SODIUM-DEXTROSE 2-4 GM/100ML-% IV SOLN
INTRAVENOUS | Status: AC
  Filled 2021-10-26: qty 100

## 2021-10-26 MED ORDER — DIPHENHYDRAMINE HCL 25 MG PO CAPS: 25.0000 mg | ORAL_CAPSULE | Freq: Four times a day (QID) | ORAL | Status: DC | PRN

## 2021-10-26 MED ORDER — FENTANYL CITRATE (PF) 100 MCG/2ML IJ SOLN
INTRAMUSCULAR | Status: DC | PRN
  Administered 2021-10-26: 15 ug via INTRATHECAL

## 2021-10-26 MED ORDER — LACTATED RINGERS IV SOLN: INTRAVENOUS | Status: DC | PRN

## 2021-10-26 MED ORDER — IBUPROFEN 600 MG PO TABS: 600.0000 mg | ORAL_TABLET | Freq: Four times a day (QID) | ORAL | Status: DC

## 2021-10-26 MED ORDER — FENTANYL CITRATE (PF) 100 MCG/2ML IJ SOLN
INTRAMUSCULAR | Status: AC
  Filled 2021-10-26: qty 2

## 2021-10-26 MED ORDER — PROMETHAZINE HCL 25 MG/ML IJ SOLN: 6.2500 mg | INTRAMUSCULAR | Status: DC | PRN

## 2021-10-26 MED ORDER — PHENYLEPHRINE HCL-NACL 20-0.9 MG/250ML-% IV SOLN
INTRAVENOUS | Status: AC
  Filled 2021-10-26: qty 250

## 2021-10-26 MED ORDER — SENNOSIDES-DOCUSATE SODIUM 8.6-50 MG PO TABS
2.0000 | ORAL_TABLET | Freq: Every day | ORAL | Status: DC
  Administered 2021-10-27: 2 via ORAL
  Filled 2021-10-26 (×2): qty 2

## 2021-10-26 MED ORDER — PHENYLEPHRINE HCL-NACL 20-0.9 MG/250ML-% IV SOLN
INTRAVENOUS | Status: DC | PRN
  Administered 2021-10-26: 60 ug/min via INTRAVENOUS

## 2021-10-26 MED ORDER — LACTATED RINGERS IV SOLN: INTRAVENOUS | Status: DC

## 2021-10-26 MED ORDER — OXYCODONE HCL 5 MG PO TABS: 5.0000 mg | ORAL_TABLET | Freq: Once | ORAL | Status: DC | PRN

## 2021-10-26 MED ORDER — KETOROLAC TROMETHAMINE 30 MG/ML IJ SOLN
30.0000 mg | Freq: Four times a day (QID) | INTRAMUSCULAR | Status: AC
  Administered 2021-10-27 (×4): 30 mg via INTRAVENOUS
  Filled 2021-10-26 (×4): qty 1

## 2021-10-26 MED ORDER — ONDANSETRON HCL 4 MG/2ML IJ SOLN
INTRAMUSCULAR | Status: AC
  Filled 2021-10-26: qty 2

## 2021-10-26 MED ORDER — AMISULPRIDE (ANTIEMETIC) 5 MG/2ML IV SOLN: 10.0000 mg | Freq: Once | INTRAVENOUS | Status: DC | PRN

## 2021-10-26 MED ORDER — ACETAMINOPHEN 500 MG PO TABS: 1000.0000 mg | ORAL_TABLET | Freq: Four times a day (QID) | ORAL | Status: DC

## 2021-10-26 MED ORDER — SOD CITRATE-CITRIC ACID 500-334 MG/5ML PO SOLN
30.0000 mL | Freq: Once | ORAL | Status: AC
  Administered 2021-10-26: 30 mL via ORAL

## 2021-10-26 MED ORDER — OXYCODONE HCL 5 MG/5ML PO SOLN: 5.0000 mg | Freq: Once | ORAL | Status: DC | PRN

## 2021-10-26 MED ORDER — BUPIVACAINE IN DEXTROSE 0.75-8.25 % IT SOLN
INTRATHECAL | Status: DC | PRN
  Administered 2021-10-26: 1.6 mL via INTRATHECAL

## 2021-10-26 MED ORDER — MENTHOL 3 MG MT LOZG: 1.0000 | LOZENGE | OROMUCOSAL | Status: DC | PRN

## 2021-10-26 MED ORDER — SODIUM CHLORIDE 0.9 % IR SOLN
Status: DC | PRN
  Administered 2021-10-26: 1

## 2021-10-26 MED ORDER — WITCH HAZEL-GLYCERIN EX PADS: 1.0000 | MEDICATED_PAD | CUTANEOUS | Status: DC | PRN

## 2021-10-26 MED ORDER — SIMETHICONE 80 MG PO CHEW
80.0000 mg | CHEWABLE_TABLET | Freq: Three times a day (TID) | ORAL | Status: DC
  Administered 2021-10-27 (×3): 80 mg via ORAL
  Filled 2021-10-26 (×4): qty 1

## 2021-10-26 MED ORDER — ENOXAPARIN SODIUM 40 MG/0.4ML IJ SOSY
40.0000 mg | PREFILLED_SYRINGE | INTRAMUSCULAR | Status: DC
  Administered 2021-10-27: 40 mg via SUBCUTANEOUS
  Filled 2021-10-26 (×2): qty 0.4

## 2021-10-26 MED ORDER — KETOROLAC TROMETHAMINE 30 MG/ML IJ SOLN
30.0000 mg | Freq: Once | INTRAMUSCULAR | Status: AC
  Administered 2021-10-26: 30 mg via INTRAVENOUS

## 2021-10-26 MED ORDER — ENOXAPARIN SODIUM 40 MG/0.4ML IJ SOSY: 40.0000 mg | PREFILLED_SYRINGE | INTRAMUSCULAR | Status: DC

## 2021-10-26 MED ORDER — DIBUCAINE (PERIANAL) 1 % EX OINT: 1.0000 | TOPICAL_OINTMENT | CUTANEOUS | Status: DC | PRN

## 2021-10-26 MED ORDER — MORPHINE SULFATE (PF) 0.5 MG/ML IJ SOLN
INTRAMUSCULAR | Status: DC | PRN
  Administered 2021-10-26: 150 ug via INTRATHECAL

## 2021-10-26 MED ORDER — OXYTOCIN-SODIUM CHLORIDE 30-0.9 UT/500ML-% IV SOLN
INTRAVENOUS | Status: DC | PRN
  Administered 2021-10-26: 300 mL via INTRAVENOUS

## 2021-10-26 MED ORDER — FENTANYL CITRATE (PF) 100 MCG/2ML IJ SOLN: 25.0000 ug | INTRAMUSCULAR | Status: DC | PRN

## 2021-10-26 MED ORDER — MEPERIDINE HCL 25 MG/ML IJ SOLN: 6.2500 mg | INTRAMUSCULAR | Status: DC | PRN

## 2021-10-26 MED ORDER — STERILE WATER FOR IRRIGATION IR SOLN
Status: DC | PRN
  Administered 2021-10-26: 1000 mL

## 2021-10-26 MED ORDER — MORPHINE SULFATE (PF) 0.5 MG/ML IJ SOLN
INTRAMUSCULAR | Status: AC
  Filled 2021-10-26: qty 10

## 2021-10-26 MED ORDER — DEXAMETHASONE SODIUM PHOSPHATE 4 MG/ML IJ SOLN
INTRAMUSCULAR | Status: AC
  Filled 2021-10-26: qty 2

## 2021-10-26 MED ORDER — PRENATAL MULTIVITAMIN CH
1.0000 | ORAL_TABLET | Freq: Every day | ORAL | Status: DC
Start: 2021-10-27 — End: 2021-10-26

## 2021-10-26 MED ORDER — PHENYLEPHRINE 80 MCG/ML (10ML) SYRINGE FOR IV PUSH (FOR BLOOD PRESSURE SUPPORT)
PREFILLED_SYRINGE | INTRAVENOUS | Status: AC
  Filled 2021-10-26: qty 10

## 2021-10-26 MED ORDER — ONDANSETRON HCL 4 MG/2ML IJ SOLN
INTRAMUSCULAR | Status: DC | PRN
  Administered 2021-10-26: 4 mg via INTRAVENOUS

## 2021-10-26 MED ORDER — OXYCODONE-ACETAMINOPHEN 5-325 MG PO TABS: 1.0000 | ORAL_TABLET | ORAL | Status: DC | PRN

## 2021-10-26 MED ORDER — GABAPENTIN 100 MG PO CAPS: 200.0000 mg | ORAL_CAPSULE | Freq: Every day | ORAL | Status: DC

## 2021-10-26 MED ORDER — SCOPOLAMINE 1 MG/3DAYS TD PT72: 1.0000 | MEDICATED_PATCH | Freq: Once | TRANSDERMAL | Status: DC

## 2021-10-26 MED ORDER — TRANEXAMIC ACID-NACL 1000-0.7 MG/100ML-% IV SOLN
INTRAVENOUS | Status: DC | PRN
  Administered 2021-10-26: 1000 mg via INTRAVENOUS

## 2021-10-26 SURGICAL SUPPLY — 40 items
BENZOIN TINCTURE PRP APPL 2/3 (GAUZE/BANDAGES/DRESSINGS) IMPLANT
CHLORAPREP W/TINT 26ML (MISCELLANEOUS) ×2 IMPLANT
CLAMP CORD UMBIL (MISCELLANEOUS) ×1 IMPLANT
CLOTH BEACON ORANGE TIMEOUT ST (SAFETY) ×1 IMPLANT
DERMABOND ADHESIVE PROPEN (GAUZE/BANDAGES/DRESSINGS) ×2
DERMABOND ADVANCED (GAUZE/BANDAGES/DRESSINGS) ×2
DERMABOND ADVANCED .7 DNX12 (GAUZE/BANDAGES/DRESSINGS) ×2 IMPLANT
DERMABOND ADVANCED .7 DNX6 (GAUZE/BANDAGES/DRESSINGS) IMPLANT
DRSG OPSITE POSTOP 4X10 (GAUZE/BANDAGES/DRESSINGS) ×1 IMPLANT
ELECT REM PT RETURN 9FT ADLT (ELECTROSURGICAL) ×1
ELECTRODE REM PT RTRN 9FT ADLT (ELECTROSURGICAL) ×1 IMPLANT
EXTRACTOR VACUUM M CUP 4 TUBE (SUCTIONS) IMPLANT
GLOVE BIOGEL PI IND STRL 7.0 (GLOVE) ×2 IMPLANT
GLOVE BIOGEL PI IND STRL 7.5 (GLOVE) ×2 IMPLANT
GLOVE BIOGEL PI INDICATOR 7.0 (GLOVE) ×2
GLOVE BIOGEL PI INDICATOR 7.5 (GLOVE) ×2
GLOVE ECLIPSE 7.5 STRL STRAW (GLOVE) ×1 IMPLANT
GOWN STRL REUS W/TWL LRG LVL3 (GOWN DISPOSABLE) ×3 IMPLANT
HEMOSTAT ARISTA ABSORB 3G PWDR (HEMOSTASIS) IMPLANT
KIT ABG SYR 3ML LUER SLIP (SYRINGE) IMPLANT
NDL HYPO 25X5/8 SAFETYGLIDE (NEEDLE) IMPLANT
NEEDLE HYPO 25X5/8 SAFETYGLIDE (NEEDLE) IMPLANT
NS IRRIG 1000ML POUR BTL (IV SOLUTION) ×1 IMPLANT
PACK C SECTION WH (CUSTOM PROCEDURE TRAY) ×1 IMPLANT
PAD OB MATERNITY 4.3X12.25 (PERSONAL CARE ITEMS) ×1 IMPLANT
RTRCTR C-SECT PINK 25CM LRG (MISCELLANEOUS) ×1 IMPLANT
STRIP CLOSURE SKIN 1/2X4 (GAUZE/BANDAGES/DRESSINGS) IMPLANT
SUT MNCRL 0 VIOLET CTX 36 (SUTURE) ×2 IMPLANT
SUT MON AB-0 CT1 36 (SUTURE) IMPLANT
SUT MONOCRYL 0 CTX 36 (SUTURE) ×2
SUT PLAIN 2 0 (SUTURE) ×1
SUT PLAIN ABS 2-0 CT1 27XMFL (SUTURE) IMPLANT
SUT VIC AB 0 CTX 36 (SUTURE) ×1
SUT VIC AB 0 CTX36XBRD ANBCTRL (SUTURE) ×1 IMPLANT
SUT VIC AB 2-0 CT1 27 (SUTURE) ×1
SUT VIC AB 2-0 CT1 TAPERPNT 27 (SUTURE) ×1 IMPLANT
SUT VIC AB 4-0 KS 27 (SUTURE) ×1 IMPLANT
TOWEL OR 17X24 6PK STRL BLUE (TOWEL DISPOSABLE) ×1 IMPLANT
TRAY FOLEY W/BAG SLVR 14FR LF (SET/KITS/TRAYS/PACK) ×1 IMPLANT
WATER STERILE IRR 1000ML POUR (IV SOLUTION) ×1 IMPLANT

## 2021-10-26 NOTE — Discharge Summary (Signed)
Postpartum Discharge Summary  Date of Service updated***     Patient Name: Ruth Solomon DOB: [birth year] MRN: [id number]  Date of admission: 10/23/2021 Delivery date:10/26/2021  Delivering provider: Shelda Pal  Date of discharge: 10/26/2021  Admitting diagnosis: Premature rupture of membranes [O42.90] Intrauterine pregnancy: [redacted]w[redacted]d    Secondary diagnosis:  Principal Problem:   Premature rupture of membranes Active Problems:   Gestational thrombocytopenia ([place]   Status post cesarean section  Additional problems: ***    Discharge diagnosis: Preterm Pregnancy Delivered                                              Post partum procedures:{Postpartum procedures:23558} Augmentation: N/A Complications: Placental Abruption and ROM>24 hours  Hospital course: Onset of Labor With Unplanned C/S   21y.o. yo [id number] admitted in Latent Labor on 10/23/2021. Patient had a labor course significant for PROM. The patient went for cesarean section due to Elective Repeat. Delivery details as follows: Membrane Rupture Time/Date: 8:00 AM ,10/23/2021   Delivery Method:C-Section, Low Transverse  Details of operation can be found in separate operative note. Patient had an uncomplicated postpartum course.  She is ambulating,tolerating a regular diet, passing flatus, and urinating well.  Patient is discharged home in stable condition 10/26/21.  Newborn Data: Birth date:10/26/2021  Birth time:4:15 PM  Gender:Female  Living status:Living  Apgars:8 ,9  Weight:1910 g   Magnesium Sulfate received: No BMZ received: Yes Rhophylac:N/A MMR:N/A T-DaP:Given prenatally Flu: N/A Transfusion:{Transfusion received:30440034}  Physical exam  Vitals:   10/26/21 1815 10/26/21 1830 10/26/21 1919 10/26/21 1947  BP: 92/80 (!) 100/59 100/68 (!) 100/58  Pulse: 61 64 (!) 57 61  Resp: '18 16 17 18  ' Temp:  97.7 F (36.5 C) (!) 97.4 F (36.3 C) 97.6 F (36.4 C)  TempSrc:  Axillary  Axillary Oral  SpO2: 98% 100% 100% 100%  Weight:      Height:       General: {Exam; general:21111117} Lochia: {Desc; appropriate/inappropriate:30686::"appropriate"} Uterine Fundus: {Desc; firm/soft:30687} Incision: {Exam; incision:21111123} DVT Evaluation: {Exam; dvt:2111122} Labs: Lab Results  Component Value Date   WBC 11.4 (H) 10/26/2021   HGB 10.8 (L) 10/26/2021   HCT 33.4 (L) 10/26/2021   MCV 81.5 10/26/2021   PLT 96 (L) 10/26/2021      Latest Ref Rng & Units 05/12/2021   12:59 PM  CMP  Glucose 70 - 99 mg/dL 142   BUN 6 - 20 mg/dL 11   Creatinine 0.57 - 1.00 mg/dL 0.67   Sodium 134 - 144 mmol/L 133   Potassium 3.5 - 5.2 mmol/L 3.4   Chloride 96 - 106 mmol/L 94   CO2 20 - 29 mmol/L 19   Calcium 8.7 - 10.2 mg/dL 9.7   Total Protein 6.0 - 8.5 g/dL 7.1   Total Bilirubin 0.0 - 1.2 mg/dL 0.4   Alkaline Phos 44 - 121 IU/L 76   AST 0 - 40 IU/L 17   ALT 0 - 32 IU/L 13    Edinburgh Score:    06/26/2019    2:14 PM  Edinburgh Postnatal Depression Scale Screening Tool  I have been able to laugh and see the funny side of things. 0  I have looked forward with enjoyment to things. 0  I have blamed myself unnecessarily when things went wrong. 0  I have been anxious or  worried for no good reason. 0  I have felt scared or panicky for no good reason. 0  Things have been getting on top of me. 0  I have been so unhappy that I have had difficulty sleeping. 0  I have felt sad or miserable. 0  I have been so unhappy that I have been crying. 0  The thought of harming myself has occurred to me. 0  Edinburgh Postnatal Depression Scale Total 0     After visit meds:  Allergies as of 10/26/2021   No Known Allergies   Med Rec must be completed prior to using this [facility]***        Discharge home in stable condition Infant Feeding: {Baby feeding:23562} Infant Disposition:{CHL IP OB HOME WITH PUGGPC:61969} Discharge instruction: per After Visit Summary and Postpartum  booklet. Activity: Advance as tolerated. Pelvic rest for 6 weeks.  Diet: {OB GKBO:28675198} Future Appointments:No future appointments. Follow up Visit:   Please schedule this patient for a In person postpartum visit in 6 weeks with the following provider: Any provider. Additional Postpartum F/U:Incision check 1 week  High risk pregnancy complicated by:  Gestational thrombocytopenia Delivery mode:  C-Section, Low Transverse  Anticipated Birth Control:  Nexplanon   10/26/2021 Shelda Pal, DO

## 2021-10-26 NOTE — Lactation Note (Signed)
This note was copied from a baby's chart. Lactation Consultation Note  Patient Name: Ruth Solomon JASNK'N Date: 10/26/2021   Age:23 hours  Lactation orders received and acknowledged, MOB still in PACU, will F/U once NICU LC returns.   Sundiata Ferrick S Vernie Piet 10/26/2021, 6:29 PM

## 2021-10-26 NOTE — Op Note (Signed)
Ruth Solomon PROCEDURE DATE: 10/26/2021  PREOPERATIVE DIAGNOSES: Intrauterine pregnancy at [redacted]w[redacted]d weeks gestation;  repeat elective, PROM  POSTOPERATIVE DIAGNOSES: The same, viable infant delivered  PROCEDURE: Repeat Low Transverse Cesarean Section  SURGEON:  Dr. Gerilyn Pilgrim    ASSISTANT:  Myrtie Hawk, DO An experienced assistant was required given the standard of surgical care given the complexity of the case.  This assistant was needed for exposure, dissection, suctioning, retraction, instrument exchange, assisting with delivery with administration of fundal pressure, and for overall help during the procedure.  ANESTHESIOLOGY TEAM: Anesthesiologist: Leonides Grills, MD CRNA: Renford Dills, CRNA  INDICATIONS: Ruth Solomon is a 21 y.o. [phone] at [redacted]w[redacted]d here for cesarean section secondary to the indications listed under preoperative diagnoses; please see preoperative note for further details.  The risks of surgery were discussed with the patient including but were not limited to: bleeding which may require transfusion or reoperation; infection which may require antibiotics; injury to bowel, bladder, ureters or other surrounding organs; injury to the fetus; need for additional procedures including hysterectomy in the event of a life-threatening hemorrhage; formation of adhesions; placental abnormalities wth subsequent pregnancies; incisional problems; thromboembolic phenomenon and other postoperative/anesthesia complications.  The patient concurred with the proposed plan, giving informed written consent for the procedure.    FINDINGS:  Viable female infant in cephalic presentation.  Apgars 8 and 9.  Amniotic fluid: clear.  Intact placenta, three vessel cord.  Normal uterus, fallopian tubes and ovaries bilaterally.  ANESTHESIA: spinal INTRAVENOUS FLUIDS: 2400 ml   ESTIMATED BLOOD LOSS: 800 ml URINE OUTPUT:  175 ml SPECIMENS: Placenta sent to pathology . COMPLICATIONS: None  immediate  PROCEDURE IN DETAIL:  The patient preoperatively received intravenous antibiotics and had sequential compression devices applied to her lower extremities.  She was then taken to the operating room where spinal anesthesia was found to be adequate. She was then placed in a dorsal supine position with a leftward tilt, and prepped and draped in a sterile manner.  A foley catheter was  placed into her bladder and attached to constant gravity.  After an adequate timeout was performed, a Pfannenstiel skin incision was made with scalpel and carried through to the underlying layer of fascia. The fascia was incised in the midline, and this incision was extended sharply with mayo scissors. The rectus muscles were separated in the midline and the peritoneum was entered bluntly.   The Alexis self-retaining retractor was introduced into the abdominal cavity.  Attention was turned to the lower uterine segment where a low transverse hysterotomy was made with a scalpel and extended bluntly in caudad and cephalad directions.  The uterine incision was extended vertically on the left.The infant was successfully delivered, the cord was clamped and cut immediately, and the infant was handed over to the awaiting neonatology team.  Placental abruption noted. Uterine massage was then administered, and the placenta delivered with a three-vessel cord. The uterus was then cleared of clots and debris.  The hysterotomy was closed with 0-Monocryl in a running fashion.  A second imbricating layer was placed with 0- Monocryl in running fashion. Figure-of-eight 0 Vicryl serosal stitches were placed to help with hemostasis.    The pelvis was cleared of all clot and debris. Hemostasis was confirmed on all surfaces. The uterus incision was once again inspected and found to be hemostatic. The retractor was removed.  The peritoneum was closed with a 2-0 Vicryl running stitch. The fascia was then closed using 0 Vicryl in a running  fashion.  The subcutaneous layer was irrigated, was closed with 2-0 vicryl suture, any areas of bleeding were cauterized with the bovie,  was found to be hemostatic.. The skin was closed with a 4-0 Vicryl subcuticular stitch. The patient tolerated the procedure well. Sponge, instrument and needle counts were correct x 3.  She was taken to the recovery room in stable condition.    Myrtie Hawk, DO FMOB Fellow, Faculty practice [facility], Center for [facility] 10/26/21  7:50 PM

## 2021-10-26 NOTE — Anesthesia Procedure Notes (Signed)
Spinal  Patient location during procedure: OR Start time: 10/26/2021 3:50 PM End time: 10/26/2021 3:55 PM Reason for block: surgical anesthesia Staffing Performed: anesthesiologist  Anesthesiologist: Leonides Grills, MD Performed by: Leonides Grills, MD Authorized by: Leonides Grills, MD   Preanesthetic Checklist Completed: patient identified, IV checked, risks and benefits discussed, surgical consent, monitors and equipment checked, pre-op evaluation and timeout performed Spinal Block Patient position: sitting Prep: DuraPrep Patient monitoring: cardiac monitor, continuous pulse ox and blood pressure Approach: midline Location: L4-5 Injection technique: single-shot Needle Needle type: Pencan  Needle gauge: 24 G Needle length: 9 cm Assessment Sensory level: T10 Events: CSF return Additional Notes Functioning IV was confirmed and monitors were applied. Sterile prep and drape, including hand hygiene and sterile gloves were used. The patient was positioned and the spine was prepped. The skin was anesthetized with lidocaine.  Free flow of clear CSF was obtained prior to injecting local anesthetic into the CSF.  The spinal needle aspirated freely following injection.  The needle was carefully withdrawn.  The patient tolerated the procedure well.

## 2021-10-26 NOTE — Anesthesia Preprocedure Evaluation (Addendum)
Anesthesia Evaluation  Patient identified by MRN, date of birth, ID band Patient awake    Reviewed: Allergy & Precautions, NPO status , Patient's Chart, lab work & pertinent test results  Airway Mallampati: II  TM Distance: >3 FB Neck ROM: Full    Dental no notable dental hx.    Pulmonary neg pulmonary ROS,    Pulmonary exam normal        Cardiovascular negative cardio ROS Normal cardiovascular exam     Neuro/Psych negative neurological ROS  negative psych ROS   GI/Hepatic negative GI ROS, Neg liver ROS,   Endo/Other  negative endocrine ROS  Renal/GU negative Renal ROS     Musculoskeletal negative musculoskeletal ROS (+)   Abdominal   Peds  Hematology  (+) Blood dyscrasia, anemia , gestational thrombocytopenia   Anesthesia Other Findings Elective repeat C/S  Reproductive/Obstetrics                            Anesthesia Physical Anesthesia Plan  ASA: 2  Anesthesia Plan: Spinal   Post-op Pain Management:    Induction: Intravenous  PONV Risk Score and Plan: 3 and Ondansetron, Dexamethasone and Treatment may vary due to age or medical condition  Airway Management Planned: Natural Airway  Additional Equipment:   Intra-op Plan:   Post-operative Plan:   Informed Consent: I have reviewed the patients History and Physical, chart, labs and discussed the procedure including the risks, benefits and alternatives for the proposed anesthesia with the patient or authorized representative who has indicated his/her understanding and acceptance.     Dental advisory given  Plan Discussed with: CRNA  Anesthesia Plan Comments: (Limited attempts at spinal anesthesia due to thrombocytopenia discussed with patient and family.)        Anesthesia Quick Evaluation

## 2021-10-26 NOTE — Progress Notes (Signed)
Patient ID: Ruth Solomon, female   DOB: 03/13/2000, 21 y.o.   MRN: [id number]  The risks of cesarean section discussed with the patient included but were not limited to: bleeding which may require transfusion or reoperation; infection which may require antibiotics; injury to bowel, bladder, ureters or other surrounding organs; injury to the fetus; need for additional procedures including hysterectomy in the event of a life-threatening hemorrhage; placental abnormalities wth subsequent pregnancies, incisional problems, thromboembolic phenomenon and other postoperative/anesthesia complications. The patient concurred with the proposed plan, giving informed written consent for the procedure.   Patient has been NPO since last night, she will remain NPO for procedure. Anesthesia and OR aware.  Preoperative prophylactic Ancef ordered on call to the OR.  To OR when ready.  Platelets low - recheck stat. Hg dropped from 11.2 to 9.5. hold 2 units.  Levie Heritage, DO 10/26/2021 1:45 PM

## 2021-10-26 NOTE — Transfer of Care (Signed)
Immediate Anesthesia Transfer of Care Note  Patient: Ruth Solomon  Procedure(s) Performed: CESAREAN SECTION  Patient Location: PACU  Anesthesia Type:Spinal  Level of Consciousness: awake  Airway & Oxygen Therapy: Patient Spontanous Breathing  Post-op Assessment: Report given to RN and Post -op Vital signs reviewed and stable  Post vital signs: Reviewed and stable  Last Vitals:  Vitals Value Taken Time  BP 100/57 10/26/21 1711  Temp    Pulse 62 10/26/21 1713  Resp 19 10/26/21 1713  SpO2 100 % 10/26/21 1713  Vitals shown include unvalidated device data.  Last Pain:  Vitals:   10/26/21 1534  TempSrc: Oral  PainSc: 0-No pain      Patients Stated Pain Goal: 3 (10/25/21 1025)  Complications: No notable events documented.

## 2021-10-27 ENCOUNTER — Encounter (HOSPITAL_COMMUNITY): Payer: Self-pay | Admitting: Family Medicine

## 2021-10-27 LAB — CBC
HCT: 32.2 % — ABNORMAL LOW (ref 36.0–46.0)
Hemoglobin: 10.2 g/dL — ABNORMAL LOW (ref 12.0–15.0)
MCH: 26.1 pg (ref 26.0–34.0)
MCHC: 31.7 g/dL (ref 30.0–36.0)
MCV: 82.4 fL (ref 80.0–100.0)
Platelets: 104 10*3/uL — ABNORMAL LOW (ref 150–400)
RBC: 3.91 MIL/uL (ref 3.87–5.11)
RDW: 14 % (ref 11.5–15.5)
WBC: 11 10*3/uL — ABNORMAL HIGH (ref 4.0–10.5)
nRBC: 0 % (ref 0.0–0.2)

## 2021-10-27 LAB — TYPE AND SCREEN
ABO/RH(D): O POS
Antibody Screen: NEGATIVE
Unit division: 0
Unit division: 0

## 2021-10-27 LAB — BPAM RBC
Blood Product Expiration Date: 202309282359
Blood Product Expiration Date: 202309282359
Unit Type and Rh: 5100
Unit Type and Rh: 5100

## 2021-10-27 LAB — CULTURE, BETA STREP (GROUP B ONLY)

## 2021-10-27 LAB — CREATININE, SERUM
Creatinine, Ser: 0.53 mg/dL (ref 0.44–1.00)
GFR, Estimated: >60 mL/min (ref >60)

## 2021-10-27 NOTE — Progress Notes (Signed)
Patient screened out for psychosocial assessment since none of the following apply: Psychosocial stressors documented in mother or baby's chart Gestation less than 32 weeks Code at delivery  Infant with anomalies Please contact the Clinical Social Worker if specific needs arise, by MOB's request, or if MOB scores greater than 9/yes to question 10 on Edinburgh Postpartum Depression Screen.   CSW received consult for hx of marijuana use.  Referral was screened out due to the following: ~MOB had no documented substance use after initial prenatal visit/+UPT. ~MOB had no positive drug screens after initial prenatal visit/+UPT. ~Baby's UDS is negative.  Please consult CSW if current concerns arise or by MOB's request.  CSW will monitor CDS results and make report to Child Protective Services if warranted.  Blaine Hamper, MSW, LCSW Clinical Social Work [phone]

## 2021-10-27 NOTE — Progress Notes (Signed)
Subjective: Postpartum Day 1: Cesarean Delivery Patient reports incisional pain, tolerating PO, and no problems voiding.    Objective: Vital signs in last 24 hours: Temp:  [97.4 F (36.3 C)-98.2 F (36.8 C)] 97.4 F (36.3 C) (08/30 0833) Pulse Rate:  [53-98] 59 (08/30 0833) Resp:  [14-22] 15 (08/30 0833) BP: (91-114)/(53-80) 99/53 (08/30 0833) SpO2:  [98 %-100 %] 100 % (08/30 9678)  Physical Exam:  General: alert, cooperative, and no distress Lochia: appropriate Uterine Fundus: firm Incision: healing well, no significant drainage DVT Evaluation: No evidence of DVT seen on physical exam.  Recent Labs    10/26/21 1337 10/27/21 0525  HGB 10.8* 10.2*  HCT 33.4* 32.2*    Assessment/Plan: Status post Cesarean section. Doing well postoperatively.  Continue current care.  Scheryl Darter, MD 10/27/2021, 10:17 AM

## 2021-10-27 NOTE — Anesthesia Postprocedure Evaluation (Signed)
Anesthesia Post Note  Patient: Ruth Solomon  Procedure(s) Performed: CESAREAN SECTION     Patient location during evaluation: PACU Anesthesia Type: Spinal Level of consciousness: awake and alert Pain management: pain level controlled Vital Signs Assessment: post-procedure vital signs reviewed and stable Respiratory status: spontaneous breathing, nonlabored ventilation, respiratory function stable and patient connected to nasal cannula oxygen Cardiovascular status: stable and blood pressure returned to baseline Postop Assessment: no apparent nausea or vomiting Anesthetic complications: no   No notable events documented.  Last Vitals:  Vitals:   10/27/21 0018 10/27/21 0446  BP: (!) 105/59 (!) 93/56  Pulse: 60 (!) 56  Resp: 16 16  Temp: 36.5 C 36.4 C  SpO2: 99% 100%    Last Pain:  Vitals:   10/27/21 0446  TempSrc: Oral  PainSc:    Pain Goal: Patients Stated Pain Goal: 3 (10/25/21 1025)                 Minal Stuller P Aadya Kindler

## 2021-10-28 ENCOUNTER — Other Ambulatory Visit (HOSPITAL_COMMUNITY): Payer: Self-pay

## 2021-10-28 LAB — SURGICAL PATHOLOGY

## 2021-10-28 MED ORDER — IBUPROFEN 600 MG PO TABS
600.0000 mg | ORAL_TABLET | Freq: Four times a day (QID) | ORAL | 1 refills | Status: DC
  Filled 2021-10-28: qty 30, 8d supply, fill #0

## 2021-10-28 MED ORDER — OXYCODONE-ACETAMINOPHEN 5-325 MG PO TABS
1.0000 | ORAL_TABLET | ORAL | 0 refills | Status: DC | PRN
  Filled 2021-10-28: qty 30, 3d supply, fill #0

## 2021-10-28 NOTE — Lactation Note (Signed)
This note was copied from a baby's chart.  NICU Lactation Consultation Note  Patient Name: Ruth Solomon [id number]'K Date: 10/28/2021 Age:23 hours   Subjective Reason for consult: Initial assessment; NICU baby; Late-preterm 34-36.6wks  Lactation conducted an initial consult with Ms. Bily and her daughter, Tiburcio Pea. Parent states that she has been shown how to use the DEBP and has initiated use of the breast pump. I reviewed the settings for the DEBP on the pump in the NICU room and encouraged her to try to pump consistently, as able.  Parent has a personal pump at home that she states that she received via insurance. She was holding baby STS at this time. I discussed her feeding plan. Parent wishes to breast feed, when appropriate.  Objective Infant data: Mother's Current Feeding Choice: Breast Milk and Donor Milk  Infant feeding assessment Scale for Readiness: 2  Maternal data: [id number]  C-Section, Low Transverse Current breast feeding challenges:: NICU  Pumping frequency: Recommended q3 hours Pumped volume: 0 mL  Pump: Personal  Assessment Infant: Feeding Status: NPO  Maternal: Milk volume: Normal  Intervention/Plan Interventions: Breast feeding basics reviewed; Education; DEBP  Tools: Pump Pump Education: Setup, frequency, and cleaning  Plan: Consult Status: NICU follow-up  NICU Follow-up type: New admission follow up; Verify onset of copious milk; Verify absence of engorgement    Walker Shadow 10/28/2021, 10:15 AM

## 2021-10-30 ENCOUNTER — Ambulatory Visit: Payer: Self-pay

## 2021-10-30 NOTE — Lactation Note (Signed)
This note was copied from a baby's chart. Lactation Consultation Note  Patient Name: Ruth Solomon AUQJF'H Date: 10/30/2021 Reason for consult: Late-preterm 34-36.6wks;Infant < 6lbs;NICU baby;Breastfeeding assistance;Follow-up assessment;Mother's request Age:23 days  Visited with family of 92 hours LPI NICU female, she's a P2 and reports her milk is in. She requested assistance for the 12 pm feeding, assisted with hand expression prior latching, baby "Maleigha" required some suck training before establishing a sucking pattern (see LATCH score), she fell asleep at the breast. Reviewed normalcy patterns at this age, pumping schedule, feeding cues, lactogenesis II/III and IDF 1/2. Ms. Curfman voiced that her pump at home is not completely "emptying her" as the one at the hospital. [facility] referral faxed to the [facility] office at [facility] to get a Symphony pump for home use.   Maternal Data Does the patient have breastfeeding experience prior to this delivery?: Yes How long did the patient breastfeed?: 1 month  Feeding Mother's Current Feeding Choice: Breast Milk  LATCH Score Latch: Repeated attempts needed to sustain latch, nipple held in mouth throughout feeding, stimulation needed to elicit sucking reflex. ("lick and learn")  Audible Swallowing: None  Type of Nipple: Everted at rest and after stimulation  Comfort (Breast/Nipple): Soft / non-tender  Hold (Positioning): Assistance needed to correctly position infant at breast and maintain latch.  LATCH Score: 6  Lactation Tools Discussed/Used Tools: Pump Breast pump type: Double-Electric Breast Pump Pump Education: Setup, frequency, and cleaning;Milk Storage Reason for Pumping: LPI in NICU Pumping frequency: 3 times/24 hours Pumped volume: 75 mL  Interventions Interventions: Breast feeding basics reviewed;Assisted with latch;Breast massage;Hand express;Breast compression;Adjust position;Support pillows;DEBP;Education;Infant  Driven Feeding Algorithm education  Plan of care Encouraged to start pumping consistently every 3 hours, ideally 8 pumping sessions/24 hours MOB will take baby to an empty breast on feeding cues around feeding times Hand expression and breast massage were also encouraged prior latching/pumping  GOB present and supportive. All questions and concerns answered, family to contact [facility] services PRN.  Discharge [facility] Program: Yes  Consult Status Consult Status: NICU follow-up Date: 10/30/21 Follow-up type: In-patient   Layton Naves Venetia Constable 10/30/2021, 12:24 PM

## 2021-11-02 ENCOUNTER — Ambulatory Visit: Payer: Self-pay

## 2021-11-02 NOTE — Lactation Note (Signed)
This note was copied from a baby's chart.  NICU Lactation Consultation Note  Patient Name: Ruth Solomon JZPHX'T Date: 11/02/2021 Age:23 days   Subjective Reason for consult: Follow-up assessment; Mother's request; NICU baby; Late-preterm 34-36.6wks  Lactation followed up with Ms. Criado to assist with breastfeeding. Baby was quiet alert and at the breast, she showed strong feeding cues, such as rooting and sticking out her tongue. We noted brief latch to the right breast in cross cradle hold with a few suckles before sliding off the nipple. Baby appeared to have difficulty with latching and organizing suckling at the breast. This is appropriate for baby's age.  I educated on feeding cues, stress cues, and positioning. I recommended that Ms. Genco offer the breast at feeding times, as interest demonstrated, and to pre-pump with feedings. Parent has robust milk volume. I encouraged her to continue her routine with pumping while baby is learning how to breastfeed.  Objective Infant data: Mother's Current Feeding Choice: Breast Milk  Infant feeding assessment Scale for Readiness: 2 Scale for Quality: 3  Maternal data: [id number]  C-Section, Low Transverse Current breast feeding challenges:: NICU; LPI Does the patient have breastfeeding experience prior to this delivery?: Yes How long did the patient breastfeed?: several months  Pumping frequency: 6-8 times/day; attempts to pump q3 hours Pumped volume: 120 mL (4-5 ounces)  WIC Program: Yes WIC Referral Sent?: Yes Pump: Personal  Assessment Infant: LATCH Score: 6  Maternal: Milk volume: Normal   Intervention/Plan Interventions: Breast feeding basics reviewed; Assisted with latch; Skin to skin; Hand express; Breast compression; Adjust position; Education  Pump Education: Setup, frequency, and cleaning  Plan: Consult Status: NICU follow-up  NICU Follow-up type: Weekly NICU follow up; Assist with IDF-1 (Mother to  pre-pump before breastfeeding)   Walker Shadow 11/02/2021, 12:35 PM  Walker Shadow 11/02/2021, 12:35 PM

## 2021-11-04 ENCOUNTER — Ambulatory Visit (INDEPENDENT_AMBULATORY_CARE_PROVIDER_SITE_OTHER): Payer: Medicaid Other | Admitting: *Deleted

## 2021-11-04 ENCOUNTER — Ambulatory Visit: Payer: Self-pay

## 2021-11-04 VITALS — BP 128/84 | HR 86 | Ht 62.0 in | Wt 156.0 lb

## 2021-11-04 DIAGNOSIS — Z98891 History of uterine scar from previous surgery: Secondary | ICD-10-CM

## 2021-11-04 NOTE — Lactation Note (Addendum)
This note was copied from a baby's chart.  NICU Lactation Consultation Note  Patient Name: Ruth Solomon RDEYC'X Date: 11/04/2021 Age:23 days  Subjective Reason for consult: Follow-up assessment; NICU baby; Breastfeeding assistance; Late-preterm 34-36.6wks; Infant < 6lbs  Visited with mom of 42 37/70 weeks old (adjusted) NICU female, she's a P2 and reports she just finished pumping and completely emptied the breasts. She said she won't be able to make it for the 3 pm feeding she had scheduled with lactation; she requested rescheduling for tomorrow; still using NS. Per Ms. Marinez pumping is going well and her volumes continue to increase; although she's not pumping by the 3 hour mark, but she's staying consistent. Reviewed pumping schedule, feeding cues and anticipatory guidelines; baby is now taking bottles but she voiced that she still needs to pump "some" prior feedings at the breast due to strong MER.  Objective Infant data: Mother's Current Feeding Choice: Breast Milk  Infant feeding assessment Scale for Readiness: 2 Scale for Quality: 3  Maternal data: [id number]  C-Section, Low Transverse Pumping frequency: 5-6 times/24 hours Pumped volume: 180 mL (up to 225 ml) WIC Program: Yes WIC Referral Sent?: Yes Pump: Personal  Assessment Infant: In NICU  Maternal: Milk volume: Normal  Intervention/Plan Interventions: Breast feeding basics reviewed; DEBP; Education Tools: Pump Pump Education: Setup, frequency, and cleaning; Milk Storage  Plan of care: Encouraged to continue pumping consistently every 3 hours, ideally 8 pumping sessions/24 hours MOB will take baby to a semi-pumped breast on feeding cues around feeding times Lactation to F/U tomorrow for the 12 pm feeding    GOB present but asleep. All questions and concerns answered, family to contact [facility] services PRN.  Consult Status: NICU follow-up  NICU Follow-up type: Weekly NICU follow up; Assist with IDF-2 (Mother  does not need to pre-pump before breastfeeding) (just pump "some" prior feedings at the breast)   Ruth Solomon 11/04/2021, 2:49 PM

## 2021-11-04 NOTE — Progress Notes (Signed)
Pt here for incision check s/p cesarean section on 10/26/21. Some old bloody drainage noted. Reinforced middle of incision with 2 steri strips. No active drainage noted, no redness, tenderness or warmth noted. Advised to call if incision starts draining, separates or becomes red and warm to touch. Pt verbalized understanding.

## 2021-11-05 ENCOUNTER — Ambulatory Visit: Payer: Self-pay

## 2021-11-05 NOTE — Lactation Note (Signed)
This note was copied from a baby's chart. Lactation Consultation Note  Patient Name: Ruth Solomon WNIOE'V Date: 11/05/2021 Reason for consult: Follow-up assessment;Breastfeeding assistance;Infant < 6lbs;NICU baby;Late-preterm 34-36.6wks Age:23 days  Visited with family of 12 91/5 weeks old (adjusted) NICU female for feeding assist. Ms. Lawana Chambers pumped 2 hours ago and voiced she felt her breast were "full", baby Maleigha awake, alert and showing strong feeding cues for the 12 pm feeding. Took baby STS to mother's R breast in football hold (see LATCH score) and baby did well for this 10 minutes feeding, Ms. Naples was very pleased and voiced this is the first time baby Tiburcio Pea is doing this and the longest she has breastfeed, praised her for her efforts. Reviewed transitioning to IDF, feeding cues and normal LPI behavior. Ms. Deloria Lair understands that baby may not perform like this at every feeding, and that [facility] services are here to assist whenever is needed. She's been taking most of her PO volumes by bottle as well.  Feeding Mother's Current Feeding Choice: Breast Milk  LATCH Score Latch: Grasps breast easily, tongue down, lips flanged, rhythmical sucking. (latched with ease at the bare breast, no need to use a NS, but  needed assistance to reposition for a deeper latch)  Audible Swallowing: Spontaneous and intermittent (with and without compressions, NS patterns noted for 7 minutes. Mom did not pump prior this feeding, she took baby to a full breast)  Type of Nipple: Everted at rest and after stimulation  Comfort (Breast/Nipple): Soft / non-tender  Hold (Positioning): Assistance needed to correctly position infant at breast and maintain latch.  LATCH Score: 9  Interventions Interventions: Breast feeding basics reviewed;Assisted with latch;Skin to skin;Breast massage;Hand express;Breast compression;Adjust position;Support pillows;Education;Infant Driven Feeding Algorithm  education  Plan of care Encouraged to continue pumping consistently every 3 hours, ideally 8 pumping sessions/24 hours MOB will take baby to a full breast on feeding cues around feeding times Family will continue working on bottle feedings   No other support person at this time. All questions and concerns answered, family to contact [facility] services PRN.  Discharge Pump: Personal  Consult Status Consult Status: NICU follow-up Date: 11/05/21 Follow-up type: In-patient   Mylisa Brunson Venetia Constable 11/05/2021, 3:01 PM

## 2021-11-08 ENCOUNTER — Encounter (HOSPITAL_BASED_OUTPATIENT_CLINIC_OR_DEPARTMENT_OTHER): Payer: Medicaid Other | Admitting: Obstetrics & Gynecology

## 2021-11-08 ENCOUNTER — Ambulatory Visit: Payer: Self-pay

## 2021-11-08 NOTE — Lactation Note (Signed)
This note was copied from a baby's chart.  NICU Lactation Consultation Note  Patient Name: Girl Sabina Beavers [id number] Date: 11/08/2021 Age:23 days  Subjective Reason for consult: Follow-up assessment; NICU baby; Infant < 6lbs; Late-preterm 36-36.6wks  Visited with family of 16 18/38 weeks old (adjusted) NICU female; Ms. Clute has decided to pump and bottle feed for now, she's taking baby home today. Reviewed discharge education, infant feeding and pumping schedule; she also voiced she would like to be followed up but LC OP when asked. All questions and concerns answered, family to contact [facility] services PRN.  Objective Infant data: Mother's Current Feeding Choice: Breast Milk  Infant feeding assessment Scale for Readiness: 2 Scale for Quality: 2  Maternal data: [id number]  C-Section, Low Transverse Pumping frequency: 5-6 times/24 hours Pumped volume: 180 mL (up to 225 ml) WIC Program: Yes WIC Referral Sent?: Yes Pump: Personal  Assessment Infant: No data recorded Feeding Status: Ad lib  Maternal: Milk volume: Normal  Intervention/Plan Interventions: Breast feeding basics reviewed; DEBP; Education Tools: Pump Pump Education: Setup, frequency, and cleaning; Milk Storage  Plan of care:  Consult Status: Complete   Candra Wegner S Lisamarie Coke 11/08/2021, 1:25 PM

## 2021-11-15 ENCOUNTER — Encounter (HOSPITAL_BASED_OUTPATIENT_CLINIC_OR_DEPARTMENT_OTHER): Payer: Medicaid Other | Admitting: Obstetrics & Gynecology

## 2021-11-16 ENCOUNTER — Encounter (HOSPITAL_BASED_OUTPATIENT_CLINIC_OR_DEPARTMENT_OTHER): Payer: Medicaid Other | Admitting: Advanced Practice Midwife

## 2021-11-22 ENCOUNTER — Encounter (HOSPITAL_BASED_OUTPATIENT_CLINIC_OR_DEPARTMENT_OTHER): Payer: Medicaid Other | Admitting: Obstetrics & Gynecology

## 2021-11-29 ENCOUNTER — Encounter (HOSPITAL_BASED_OUTPATIENT_CLINIC_OR_DEPARTMENT_OTHER): Payer: Medicaid Other | Admitting: Obstetrics & Gynecology

## 2021-12-01 ENCOUNTER — Encounter (HOSPITAL_BASED_OUTPATIENT_CLINIC_OR_DEPARTMENT_OTHER): Payer: Self-pay | Admitting: Obstetrics & Gynecology

## 2021-12-06 ENCOUNTER — Encounter (HOSPITAL_BASED_OUTPATIENT_CLINIC_OR_DEPARTMENT_OTHER): Payer: Self-pay | Admitting: Obstetrics & Gynecology

## 2021-12-06 ENCOUNTER — Ambulatory Visit (INDEPENDENT_AMBULATORY_CARE_PROVIDER_SITE_OTHER): Payer: Medicaid Other | Admitting: Obstetrics & Gynecology

## 2021-12-06 VITALS — BP 120/83 | HR 80 | Ht 62.0 in | Wt 152.4 lb

## 2021-12-06 DIAGNOSIS — D563 Thalassemia minor: Secondary | ICD-10-CM

## 2021-12-06 MED ORDER — NORETHINDRONE 0.35 MG PO TABS: 1.0000 | ORAL_TABLET | Freq: Every day | ORAL | 3 refills | Status: DC

## 2021-12-06 NOTE — Progress Notes (Signed)
Post Partum Visit Note  Ruth Solomon is a 21 y.o. [phone] female who presents for a postpartum visit. She is 6 weeks postpartum following a repeat LTCS.  I have fully reviewed the prenatal and intrapartum course. The delivery was at 34 gestational weeks.  Anesthesia: spinal. Postpartum course has been good. Daughter was in the NICU for two weeks.  Baby is doing well. Baby is feeding by both breast and bottle - similac . Bleeding  irregular . Bowel function is normal. Bladder function is normal. Patient is not sexually active. Contraception method is  POP . Postpartum depression screening: negative.  The pregnancy intention screening data noted above was reviewed. Potential methods of contraception were discussed. The patient elected to proceed with progesterone only pills.  Will need rx.   Health Maintenance Due  Topic Date Due   HPV VACCINES (3 - 3-dose series) 04/11/2016   INFLUENZA VACCINE  Never done    The following portions of the patient's history were reviewed and updated as appropriate: allergies, current medications, past family history, past medical history, past social history, past surgical history, and problem list.  Review of Systems Pertinent items noted in HPI and remainder of comprehensive ROS otherwise negative.  Objective:  BP 120/83 (BP Location: Right Arm, Patient Position: Sitting, Cuff Size: Large)   Pulse 80   Ht 5\' 2"  (1.575 m) Comment: Reported  Wt 152 lb 6.4 oz (69.1 kg)   LMP 03/01/2021   Breastfeeding Yes   BMI 27.87 kg/m    General:  alert, cooperative, and no distress   Breasts:  normal  Lungs: Normal respirations  Heart:  regular rate and rhythm  Abdomen: soft, non-tender; bowel sounds normal; no masses,  no organomegaly   Wound C/D/I and well healed  GU exam:   deferred       Assessment:   Normal postpartum exam.   Plan:   Essential components of care per ACOG recommendations:  1.  Mood and well being: Patient with negative  depression screening today. Reviewed local resources for support.  - Patient tobacco use? No.   - hx of drug use? No.    2. Infant care and feeding:  -Patient currently breastmilk feeding? Yes. Discussed returning to work and pumping.  -Social determinants of health (SDOH) reviewed in EPIC. No concerns.  3. Sexuality, contraception and birth spacing - Patient does not want a pregnancy in the next year.  Desired family size is 3 children.  - Reviewed reproductive life planning. Reviewed contraceptive methods based on pt preferences and effectiveness.  Patient desired Oral Contraceptive today.  POPs will be started - Discussed birth spacing of 18 months  4. Sleep and fatigue -Encouraged family/partner/community support of 4 hrs of uninterrupted sleep to help with mood and fatigue  5. Physical Recovery  - Discussed patients delivery and complications. She describes her labor as good. - Patient had a C-section.  - Patient has urinary incontinence? No. - Patient is safe to resume physical and sexual activity  6.  Health Maintenance - HM due items addressed Yes - Last pap smear  Diagnosis  Date Value Ref Range Status  05/12/2021   Final   - Negative for Intraepithelial Lesions or Malignancy (NILM)  05/12/2021 - Benign reactive/reparative changes  Final   Pap smear not done at today's visit.  -Breast Cancer screening indicated? No.   7. Chronic Disease/Pregnancy Condition follow up: None and thrombocytopenia with improvement in platelet count after  delivery.    05/14/2021  Sabra Heck, [place] for [employer], [place]

## 2021-12-07 ENCOUNTER — Encounter (HOSPITAL_BASED_OUTPATIENT_CLINIC_OR_DEPARTMENT_OTHER): Payer: Self-pay | Admitting: Obstetrics & Gynecology

## 2021-12-08 ENCOUNTER — Encounter (HOSPITAL_BASED_OUTPATIENT_CLINIC_OR_DEPARTMENT_OTHER): Payer: Medicaid Other | Admitting: Obstetrics & Gynecology

## 2021-12-09 ENCOUNTER — Encounter (HOSPITAL_BASED_OUTPATIENT_CLINIC_OR_DEPARTMENT_OTHER): Payer: Medicaid Other | Admitting: Obstetrics & Gynecology

## 2022-11-06 ENCOUNTER — Inpatient Hospital Stay (HOSPITAL_COMMUNITY)
Admission: AD | Admit: 2022-11-06 | Discharge: 2022-11-06 | Disposition: A | Discharge status: Home / Self Care | Payer: Medicaid Other | Attending: Obstetrics & Gynecology | Admitting: Obstetrics & Gynecology

## 2022-11-06 ENCOUNTER — Other Ambulatory Visit: Payer: Self-pay

## 2022-11-06 ENCOUNTER — Inpatient Hospital Stay (HOSPITAL_COMMUNITY): Payer: Medicaid Other

## 2022-11-06 ENCOUNTER — Encounter (HOSPITAL_COMMUNITY): Payer: Self-pay | Admitting: Obstetrics & Gynecology

## 2022-11-06 DIAGNOSIS — Z3491 Encounter for supervision of normal pregnancy, unspecified, first trimester: Secondary | ICD-10-CM

## 2022-11-06 DIAGNOSIS — O219 Vomiting of pregnancy, unspecified: Secondary | ICD-10-CM

## 2022-11-06 DIAGNOSIS — Z3A01 Less than 8 weeks gestation of pregnancy: Secondary | ICD-10-CM

## 2022-11-06 DIAGNOSIS — R102 Pelvic and perineal pain: Secondary | ICD-10-CM | POA: Diagnosis present

## 2022-11-06 DIAGNOSIS — O208 Other hemorrhage in early pregnancy: Secondary | ICD-10-CM | POA: Diagnosis not present

## 2022-11-06 DIAGNOSIS — Z862 Personal history of diseases of the blood and blood-forming organs and certain disorders involving the immune mechanism: Secondary | ICD-10-CM

## 2022-11-06 DIAGNOSIS — Z3A09 9 weeks gestation of pregnancy: Secondary | ICD-10-CM

## 2022-11-06 LAB — URINALYSIS, ROUTINE W REFLEX MICROSCOPIC
Bilirubin Urine: NEGATIVE
Glucose, UA: NEGATIVE mg/dL
Hgb urine dipstick: NEGATIVE
Ketones, ur: 20 mg/dL — AB
Leukocytes,Ua: NEGATIVE
Nitrite: NEGATIVE
Protein, ur: NEGATIVE mg/dL
Specific Gravity, Urine: 1.027 (ref 1.005–1.030)
pH: 5 (ref 5.0–8.0)

## 2022-11-06 LAB — CBC
HCT: 42.4 % (ref 36.0–46.0)
Hemoglobin: 13.6 g/dL (ref 12.0–15.0)
MCH: 25.7 pg — ABNORMAL LOW (ref 26.0–34.0)
MCHC: 32.1 g/dL (ref 30.0–36.0)
MCV: 80.2 fL (ref 80.0–100.0)
Platelets: 99 10*3/uL — ABNORMAL LOW (ref 150–400)
RBC: 5.29 MIL/uL — ABNORMAL HIGH (ref 3.87–5.11)
RDW: 14 % (ref 11.5–15.5)
WBC: 9.7 10*3/uL (ref 4.0–10.5)
nRBC: 0 % (ref 0.0–0.2)

## 2022-11-06 LAB — WET PREP, GENITAL
Sperm: NONE SEEN
Trich, Wet Prep: NONE SEEN
WBC, Wet Prep HPF POC: <10 (ref <10)
Yeast Wet Prep HPF POC: NONE SEEN

## 2022-11-06 LAB — COMPREHENSIVE METABOLIC PANEL
ALT: 12 U/L (ref 0–44)
AST: 15 U/L (ref 15–41)
Albumin: 3.9 g/dL (ref 3.5–5.0)
Alkaline Phosphatase: 63 U/L (ref 38–126)
Anion gap: 9 (ref 5–15)
BUN: 12 mg/dL (ref 6–20)
CO2: 24 mmol/L (ref 22–32)
Calcium: 9.6 mg/dL (ref 8.9–10.3)
Chloride: 103 mmol/L (ref 98–111)
Creatinine, Ser: 0.72 mg/dL (ref 0.44–1.00)
GFR, Estimated: >60 mL/min (ref >60)
Glucose, Bld: 85 mg/dL (ref 70–99)
Potassium: 4.2 mmol/L (ref 3.5–5.1)
Sodium: 136 mmol/L (ref 135–145)
Total Bilirubin: 0.6 mg/dL (ref 0.3–1.2)
Total Protein: 7.1 g/dL (ref 6.5–8.1)

## 2022-11-06 LAB — HCG, QUANTITATIVE, PREGNANCY: hCG, Beta Chain, Quant, S: 89611 m[IU]/mL — ABNORMAL HIGH (ref <5)

## 2022-11-06 LAB — POCT PREGNANCY, URINE: Preg Test, Ur: POSITIVE — AB

## 2022-11-06 MED ORDER — ONDANSETRON 4 MG PO TBDP: 4.0000 mg | ORAL_TABLET | Freq: Four times a day (QID) | ORAL | 0 refills | Status: DC | PRN

## 2022-11-06 MED ORDER — GLYCOPYRROLATE 2 MG PO TABS
2.0000 mg | ORAL_TABLET | Freq: Three times a day (TID) | ORAL | 3 refills | Status: DC | PRN
Start: 2022-11-06 — End: 2023-06-29

## 2022-11-06 MED ORDER — TRANSDERM-SCOP 1 MG/3DAYS TD PT72: 1.0000 | MEDICATED_PATCH | TRANSDERMAL | 0 refills | Status: DC

## 2022-11-06 MED ORDER — PROMETHAZINE HCL 12.5 MG PO TABS: 12.5000 mg | ORAL_TABLET | Freq: Four times a day (QID) | ORAL | 0 refills | Status: DC | PRN

## 2022-11-06 NOTE — MAU Note (Signed)
Ruth Solomon is a 22 y.o. at Unknown here in MAU reporting: she can't keep anything down for past 2 days.  States has vomited 10 or more times in 24 hours.  Also reports excessive spitting.  Denies VB, endorses lower abdominal cramping. LMP: end of July, beginning of August Onset of complaint: 2 days Pain score: 5 Vitals:   11/06/22 1213  BP: 122/66  Pulse: 70  Resp: 17  Temp: 97.9 F (36.6 C)  SpO2: 100%     FHT:NA Lab orders placed from triage:  UPT

## 2022-11-06 NOTE — MAU Provider Note (Signed)
History     CSN: [id number]  Arrival date and time: 11/06/22 1153   Event Date/Time   First Provider Initiated Contact with Patient 11/06/22 1423      Chief Complaint  Patient presents with   Emesis   Nausea   Abdominal Pain   Emesis  Associated symptoms include abdominal pain. Pertinent negatives include no chest pain, chills, coughing, diarrhea, dizziness or fever.  Abdominal Pain Associated symptoms include vomiting. Pertinent negatives include no diarrhea, dysuria, fever or nausea.   Ruth Solomon is a 22 y.o. G2P1001 [redacted]w[redacted]d here with complaints of abdominal pain, nausea, vomiting in early pregnancy. OB care provider is [facility] in prior pregnancies.  She is having N/V and reports history of this in prior pregnancies. Vomited 10 times in past 24 hours. Reports she does not have medications to take.  She reports excessive spitting. She cannot keep anything down.  Denies VB. Reports mild abdominal pain. Denies vaginal discharge.    Past Medical History:  Diagnosis Date   Alpha thalassemia silent carrier    Benign gestational thrombocytopenia in third trimester (HCC) 04/10/2019   [x ] f/u heme referral: done 2/24: fu heme onc labs. Will see 6wks PP  04/10/19: verified by microscope  CBC Latest Ref Rng & Units 04/10/2019 03/25/2019 12/06/2018  WBC 3.4 - 10.8 x10E3/uL 10.1 - -  Hemoglobin 11.1 - 15.9 g/dL 40.9 81.1 91.4  Hematocrit 34.0 - 46.6 % 33.9(L) 37 37  Platelets 150 - 450 x10E3/uL 92(LL) - 103      Delivery of pregnancy by cesarean section 06/01/2019   Postpartum anemia 06/01/2019    Past Surgical History:  Procedure Laterality Date   CESAREAN SECTION N/A 05/30/2019   Procedure: CESAREAN SECTION;  Surgeon: Adam Phenix, MD;  Location: [facility] LD ORS;  Service: Obstetrics;  Laterality: N/A;   CESAREAN SECTION N/A 10/26/2021   Procedure: CESAREAN SECTION;  Surgeon: Levie Heritage, DO;  Location: [facility] LD ORS;  Service: Obstetrics;  Laterality: N/A;   FOREIGN BODY REMOVAL Left     sewing needle removed from big toe    Family History  Problem Relation Age of Onset   Healthy Mother    Hypertension Father    Healthy Father    Diabetes Maternal Grandmother    Hypertension Paternal Grandmother    Diabetes Paternal Grandmother    Hypertension Paternal Grandfather    Diabetes Paternal Grandfather     Social History   Tobacco Use   Smoking status: Never   Smokeless tobacco: Never  Vaping Use   Vaping status: Never Used  Substance Use Topics   Alcohol use: Never   Drug use: Not Currently    Types: Marijuana    Comment: last use a few months ago    Allergies: No Known Allergies  Medications Prior to Admission  Medication Sig Dispense Refill Last Dose   norethindrone (MICRONOR) 0.35 MG tablet Take 1 tablet (0.35 mg total) by mouth daily. 28 tablet 3    Prenatal Vit-Fe Fumarate-FA (MULTIVITAMIN-PRENATAL) 27-0.8 MG TABS tablet Take 1 tablet by mouth daily at 12 noon. (Patient not taking: Reported on 12/06/2021) 30 tablet 0     Review of Systems  Constitutional:  Negative for chills and fever.  HENT:  Negative for congestion and sore throat.   Eyes:  Negative for pain and visual disturbance.  Respiratory:  Negative for cough, chest tightness and shortness of breath.   Cardiovascular:  Negative for chest pain.  Gastrointestinal:  Positive for abdominal pain and vomiting.  Negative for diarrhea and nausea.  Endocrine: Negative for cold intolerance and heat intolerance.  Genitourinary:  Negative for dysuria and flank pain.  Musculoskeletal:  Negative for back pain.  Skin:  Negative for rash.  Allergic/Immunologic: Negative for food allergies.  Neurological:  Negative for dizziness and light-headedness.  Psychiatric/Behavioral:  Negative for agitation.    Physical Exam   Blood pressure 122/66, pulse 70, temperature 97.9 F (36.6 C), temperature source Oral, resp. rate 17, height 5\' 2"  (1.575 m), weight 75.3 kg, SpO2 100%, currently breastfeeding.  Wt  Readings from Last 3 Encounters:  11/06/22 75.3 kg  12/06/21 69.1 kg  11/04/21 70.8 kg   Was 53.5 kg at the beginning of last pregnancy.   Physical Exam Vitals and nursing note reviewed.  Constitutional:      General: She is not in acute distress.    Appearance: She is well-developed.  HENT:     Head: Normocephalic and atraumatic.  Eyes:     General: No scleral icterus.    Conjunctiva/sclera: Conjunctivae normal.  Cardiovascular:     Rate and Rhythm: Normal rate.  Pulmonary:     Effort: Pulmonary effort is normal.  Chest:     Chest wall: No tenderness.  Abdominal:     Palpations: Abdomen is soft.     Tenderness: There is no abdominal tenderness. There is no guarding or rebound.  Genitourinary:    Vagina: Normal.  Musculoskeletal:        General: Normal range of motion.     Cervical back: Normal range of motion and neck supple.  Skin:    General: Skin is warm and dry.     Findings: No rash.  Neurological:     Mental Status: She is alert and oriented to person, place, and time.     MAU Course  Procedures  MDM: high  This patient presents to the ED for concern of   Chief Complaint  Patient presents with   Emesis   Nausea   Abdominal Pain     These complains involves an extensive number of treatment options, and is a complaint that carries with it a high risk of complications and morbidity.  The differential diagnosis for  1. Vomiting in pregnancy INCLUDES infectious causes (less likely due to lack of infectious sx like fever/chills and otherwise normal vital signs, most likely normal variant but cannot rule out hyperemesis at this point. No signs of profound dehydration on exam.   Co morbidities that complicate the patient evaluation:  Patient Active Problem List   Diagnosis Date Noted   History of thrombocytopenia 05/13/2021     External records from outside source obtained and reviewed including Prenatal care records  Lab Tests: UA - showed elevated  keytones (20) but not significantly dehydrated.   I ordered, and personally interpreted labs.  The pertinent results include:   Results for orders placed or performed during the hospital encounter of 11/06/22 (from the past 24 hour(s))  Pregnancy, urine POC     Status: Abnormal   Collection Time: 11/06/22 12:34 PM  Result Value Ref Range   Preg Test, Ur POSITIVE (A) NEGATIVE  Urinalysis, Routine w reflex microscopic -Urine, Clean Catch     Status: Abnormal   Collection Time: 11/06/22 12:53 PM  Result Value Ref Range   Color, Urine YELLOW YELLOW   APPearance HAZY (A) CLEAR   Specific Gravity, Urine 1.027 1.005 - 1.030   pH 5.0 5.0 - 8.0   Glucose, UA NEGATIVE NEGATIVE mg/dL  Hgb urine dipstick NEGATIVE NEGATIVE   Bilirubin Urine NEGATIVE NEGATIVE   Ketones, ur 20 (A) NEGATIVE mg/dL   Protein, ur NEGATIVE NEGATIVE mg/dL   Nitrite NEGATIVE NEGATIVE   Leukocytes,Ua NEGATIVE NEGATIVE  Wet prep, genital     Status: Abnormal   Collection Time: 11/06/22 12:53 PM   Specimen: PATH Cytology Cervicovaginal Ancillary Only  Result Value Ref Range   Yeast Wet Prep HPF POC NONE SEEN NONE SEEN   Trich, Wet Prep NONE SEEN NONE SEEN   Clue Cells Wet Prep HPF POC PRESENT (A) NONE SEEN   WBC, Wet Prep HPF POC <10 <10   Sperm NONE SEEN   CBC     Status: Abnormal   Collection Time: 11/06/22 12:57 PM  Result Value Ref Range   WBC 9.7 4.0 - 10.5 K/uL   RBC 5.29 (H) 3.87 - 5.11 MIL/uL   Hemoglobin 13.6 12.0 - 15.0 g/dL   HCT 56.3 87.5 - 64.3 %   MCV 80.2 80.0 - 100.0 fL   MCH 25.7 (L) 26.0 - 34.0 pg   MCHC 32.1 30.0 - 36.0 g/dL   RDW 32.9 51.8 - 84.1 %   Platelets 99 (L) 150 - 400 K/uL   nRBC 0.0 0.0 - 0.2 %  hCG, quantitative, pregnancy     Status: Abnormal   Collection Time: 11/06/22 12:57 PM  Result Value Ref Range   hCG, Beta Chain, Quant, S 89,611 (H) <5 mIU/mL  Comprehensive metabolic panel     Status: None   Collection Time: 11/06/22 12:57 PM  Result Value Ref Range   Sodium 136 135  - 145 mmol/L   Potassium 4.2 3.5 - 5.1 mmol/L   Chloride 103 98 - 111 mmol/L   CO2 24 22 - 32 mmol/L   Glucose, Bld 85 70 - 99 mg/dL   BUN 12 6 - 20 mg/dL   Creatinine, Ser 6.60 0.44 - 1.00 mg/dL   Calcium 9.6 8.9 - 63.0 mg/dL   Total Protein 7.1 6.5 - 8.1 g/dL   Albumin 3.9 3.5 - 5.0 g/dL   AST 15 15 - 41 U/L   ALT 12 0 - 44 U/L   Alkaline Phosphatase 63 38 - 126 U/L   Total Bilirubin 0.6 0.3 - 1.2 mg/dL   GFR, Estimated >16 >01 mL/min   Anion gap 9 5 - 15     Imaging Studies ordered:  I ordered imaging studies includingTransvaginal [place] I independently visualized and interpreted imaging which showed live IUP with appropriate HR and small SCH. I agree with the radiologist interpretation   MAU Course:  2:47 PM reviewed all results with patient Wet prep did show BV but not having other sx of BV (no odor, itching). Shared decision to not treat today.    After the interventions noted above, I reevaluated the patient and found that they have :improved  Dispostion: discharged   Assessment and Plan   1. Viable pregnancy in first trimester   2. [redacted] weeks gestation of pregnancy   3. Nausea and vomiting in pregnancy   4. Subchorionic hemorrhage of placenta in first trimester   5. History of thrombocytopenia    - Discussed [place] results, given images - EDD based on [place] today - Safe for discharge with PO medications, sent to pharmacy - Counseled to call office if she develops itching/odor/symptoms of BV infection - If presents again and meds are not working would benefit from IV fluids.   Allergies as of 11/06/2022   No Known  Allergies      Medication List     STOP taking these medications    norethindrone 0.35 MG tablet Commonly known as: MICRONOR       TAKE these medications    glycopyrrolate 2 MG tablet Commonly known as: ROBINUL Take 1 tablet (2 mg total) by mouth 3 (three) times daily as needed.   multivitamin-prenatal 27-0.8 MG Tabs tablet Take 1 tablet by  mouth daily at 12 noon.   ondansetron 4 MG disintegrating tablet Commonly known as: ZOFRAN-ODT Take 1 tablet (4 mg total) by mouth every 6 (six) hours as needed for nausea.   promethazine 12.5 MG tablet Commonly known as: PHENERGAN Take 1 tablet (12.5 mg total) by mouth every 6 (six) hours as needed for nausea or vomiting.   Transderm-Scop 1 MG/3DAYS Generic drug: scopolamine Place 1 patch (1.5 mg total) onto the skin every 3 (three) days.         Isa Rankin [facility] 11/06/2022, 2:47 PM

## 2022-11-07 LAB — GC/CHLAMYDIA PROBE AMP (~~LOC~~) NOT AT ARMC
Chlamydia: NEGATIVE
Comment: NEGATIVE
Comment: NORMAL
Neisseria Gonorrhea: NEGATIVE

## 2022-11-07 LAB — RPR: RPR Ser Ql: NONREACTIVE

## 2023-02-12 IMAGING — DX DG FINGER INDEX 2+V*R*
3 series · 3 of 3 positions shown · non-contrast
Comparison: None.

CLINICAL DATA: Right index finger laceration

EXAM:
RIGHT INDEX FINGER 2+V

[finger ap]
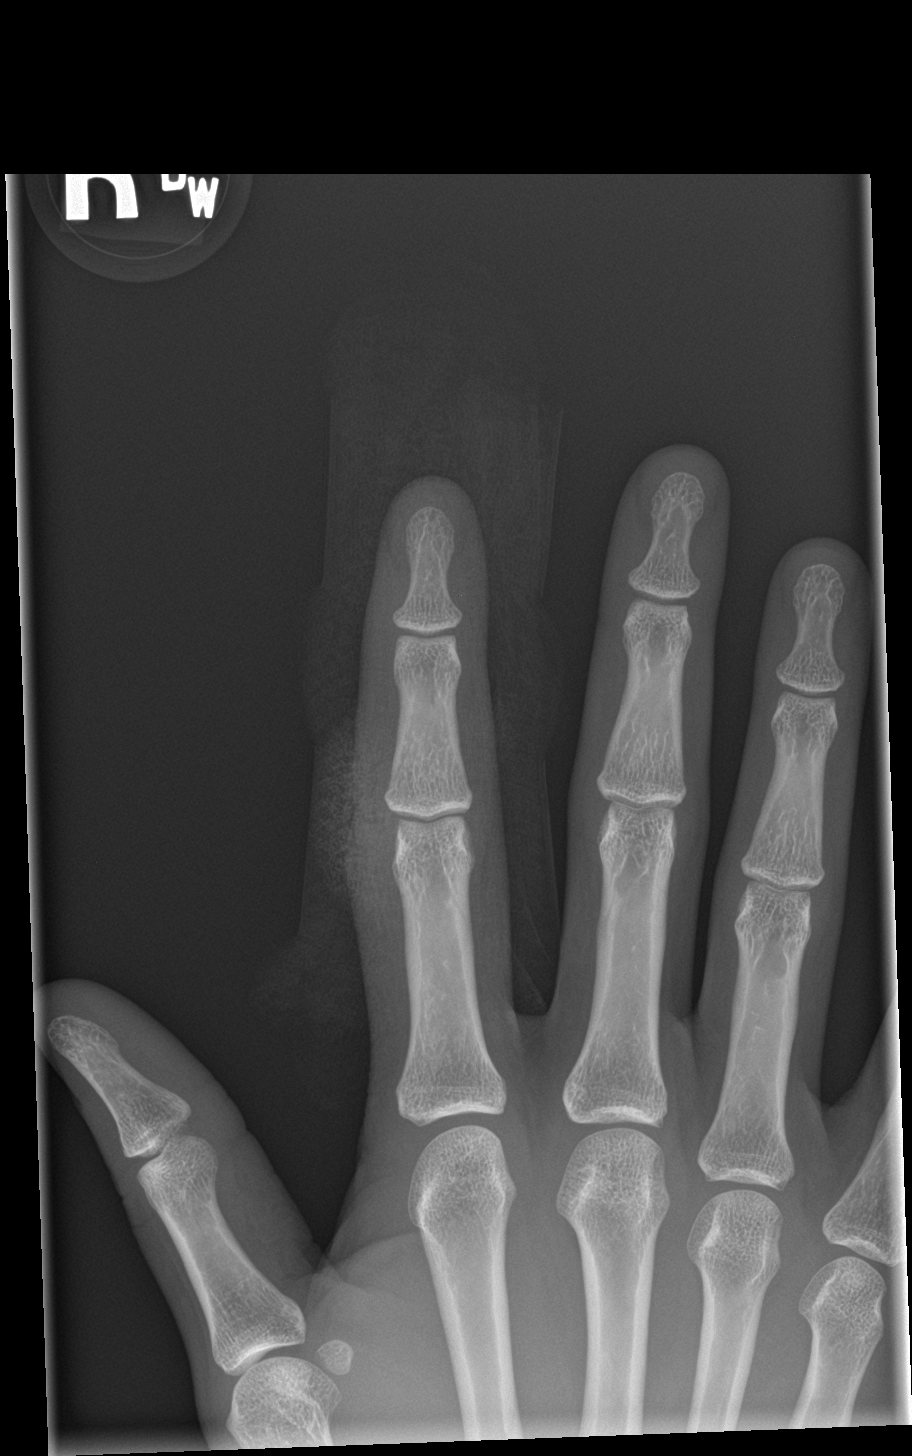

[finger obl]
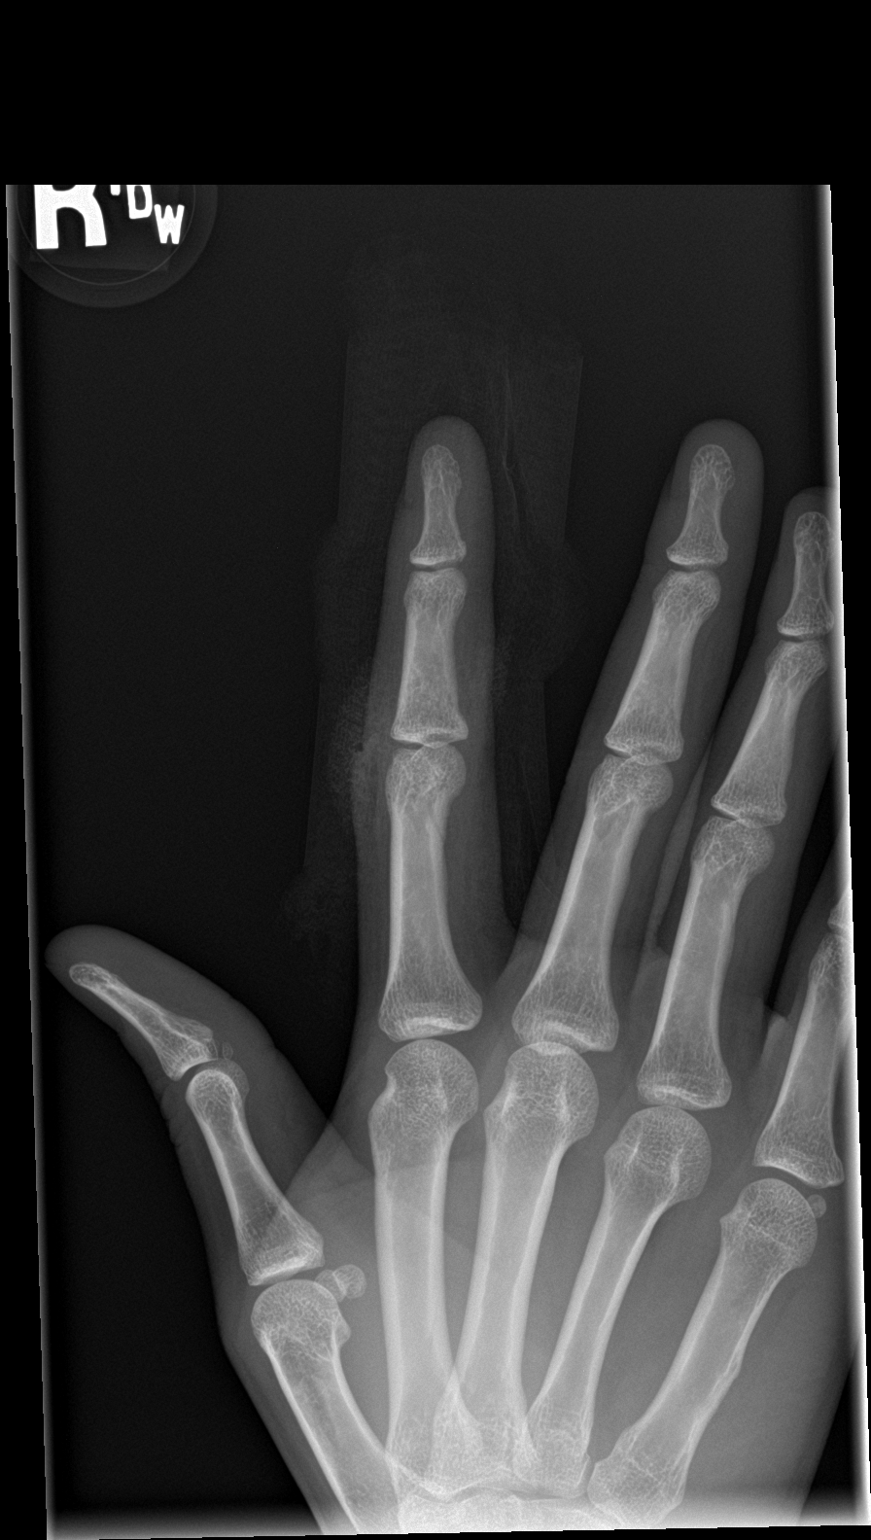

[finger lat]
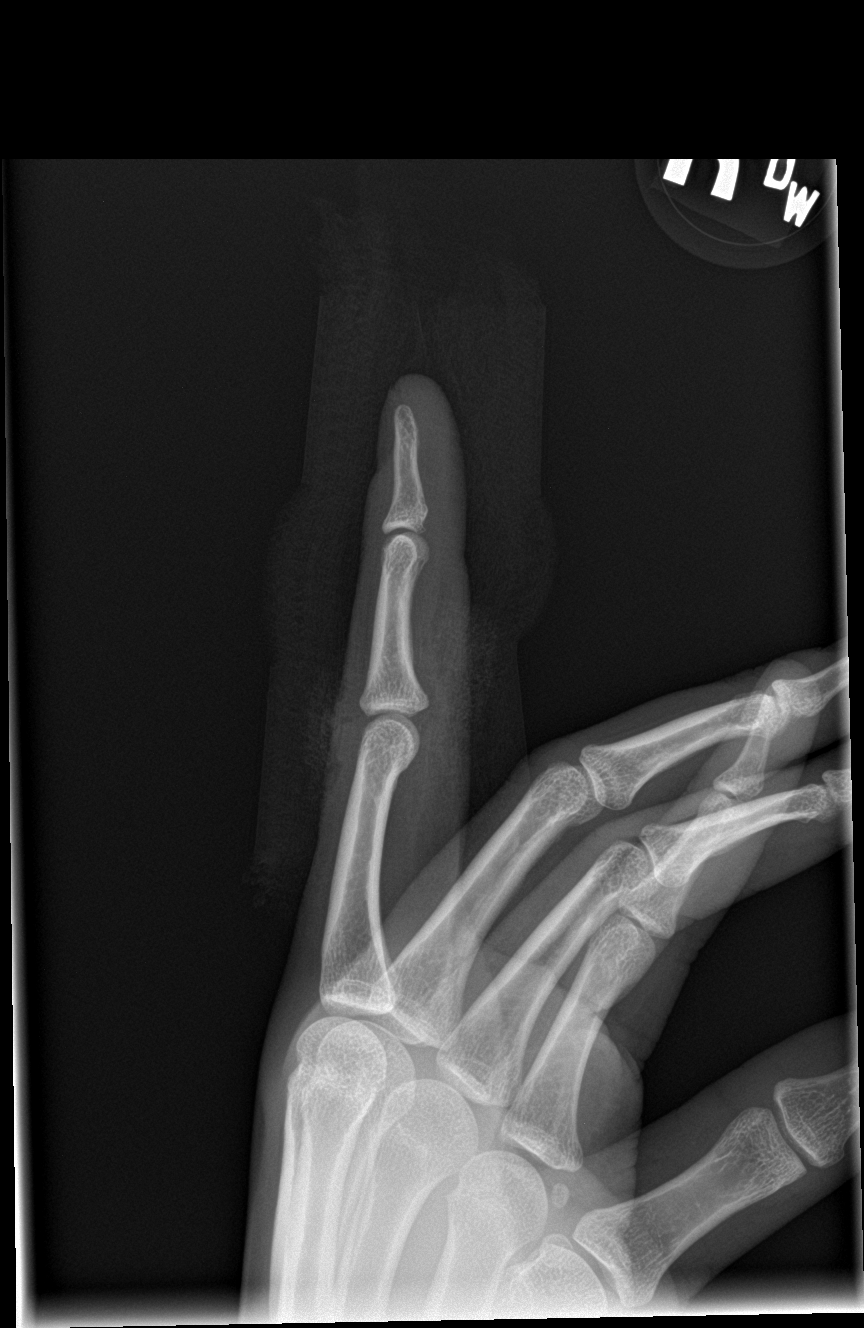

[3 of 3 positions shown; findings below may reference images not displayed]

FINDINGS: Soft tissue irregularity along the radial aspect of the right index
finger at the level of the proximal interphalangeal joint compatible
with reported laceration. No underlying fracture. No malalignment.
No arthropathy. No radiopaque foreign body.
IMPRESSION: Soft tissue laceration without underlying fracture or radiopaque
foreign body.

## 2023-05-14 IMAGING — US US OB < 14 WEEKS - US OB TV
1 series · 15 of 28 positions shown · non-contrast
Comparison: None.

CLINICAL DATA: Abdominal cramping and vaginal bleeding. Pregnant
patient, 5 weeks and 1 day based on her last menstrual period.

EXAM:
OBSTETRIC <14 WK US AND TRANSVAGINAL OB US
TECHNIQUE: Both transabdominal and transvaginal ultrasound examinations were
performed for complete evaluation of the gestation as well as the
maternal uterus, adnexal regions, and pelvic cul-de-sac.
Transvaginal technique was performed to assess early pregnancy.

[Series 1: us ob < 14 weeks - us ob tv · 34 acquisitions, 15 frames shown]
[im 1/34]
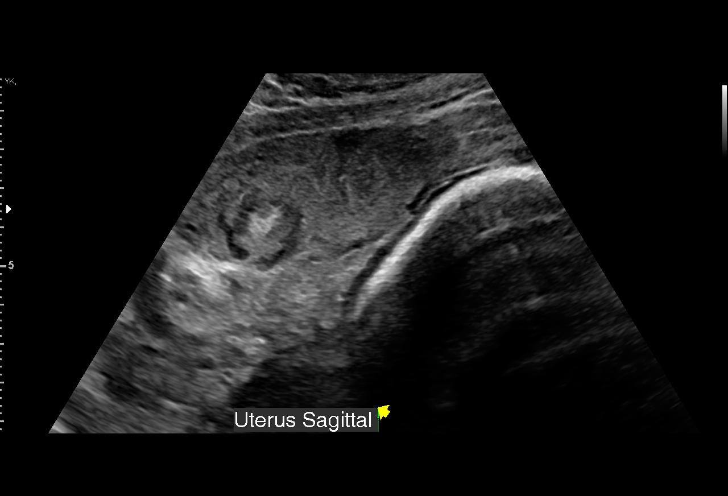
[im 3/34]
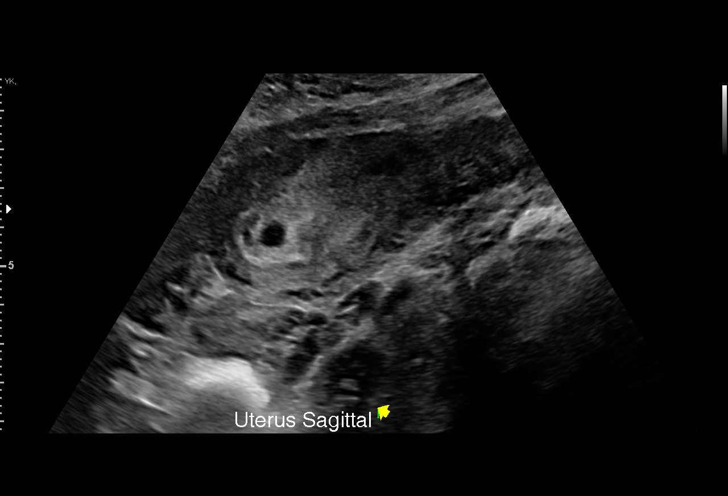
[im 5/34]
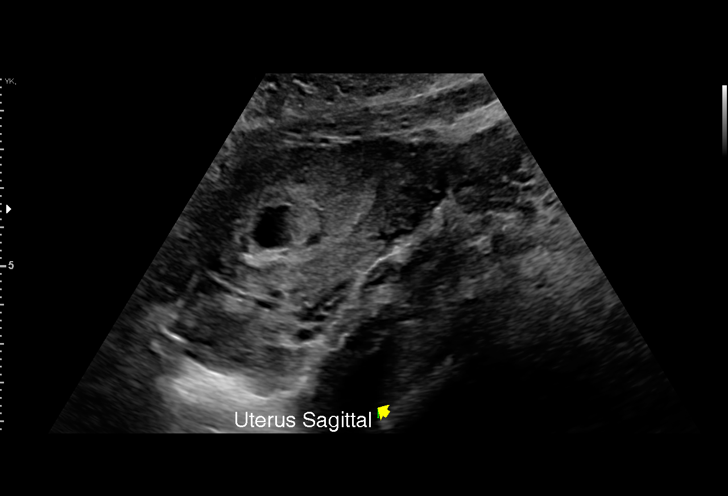
[im 8/34]
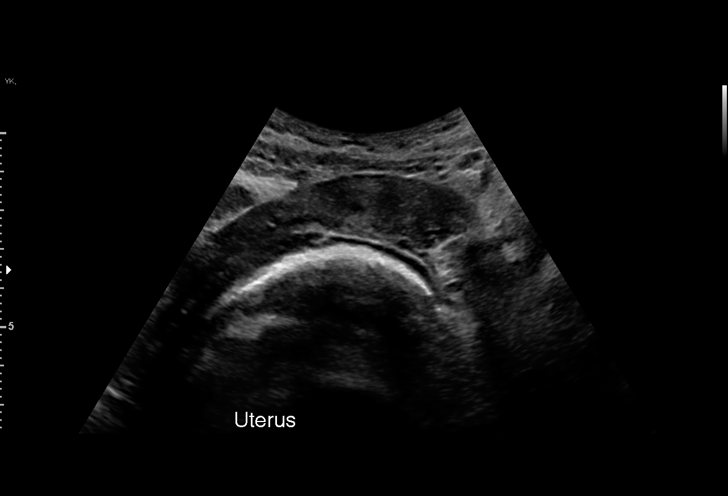
[im 10/34]
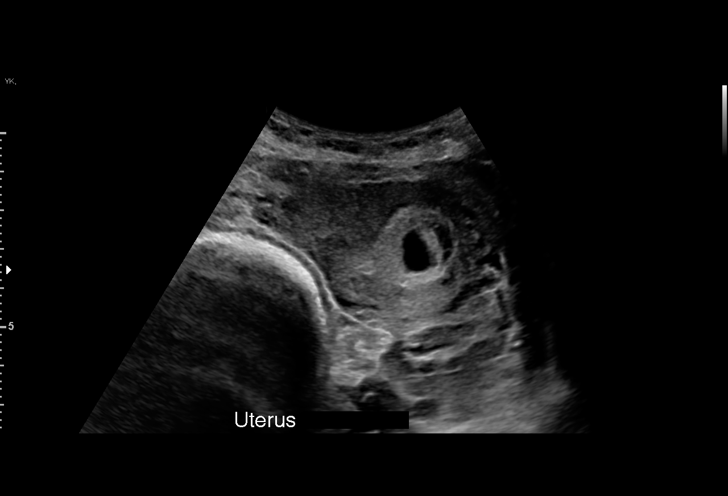
[im 13/34]
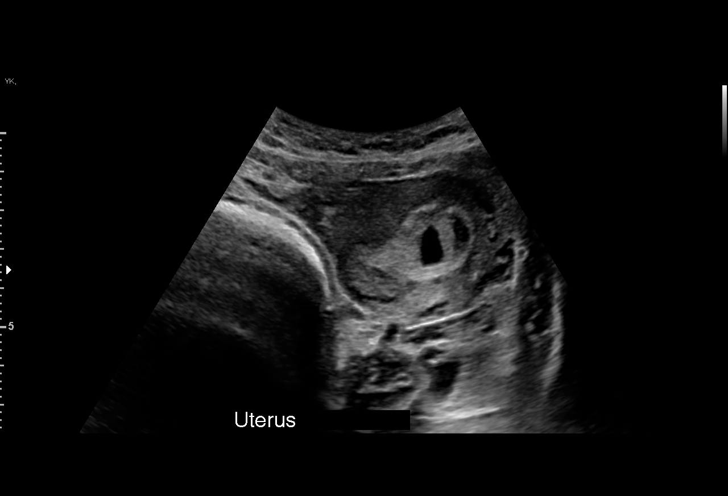
[im 15/34]
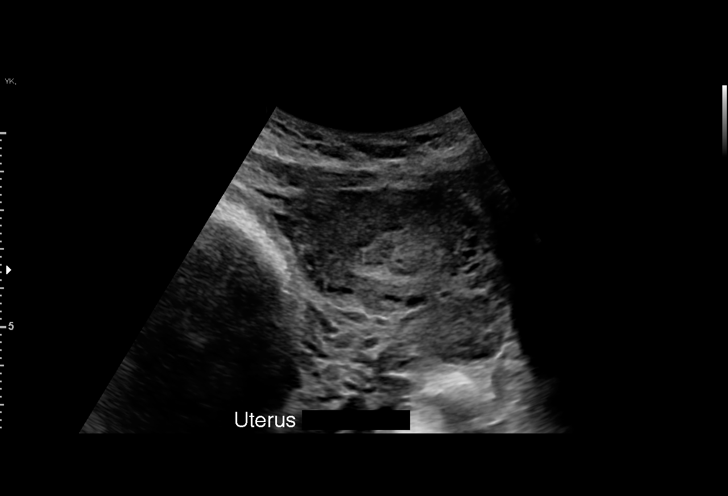
[im 18/34]
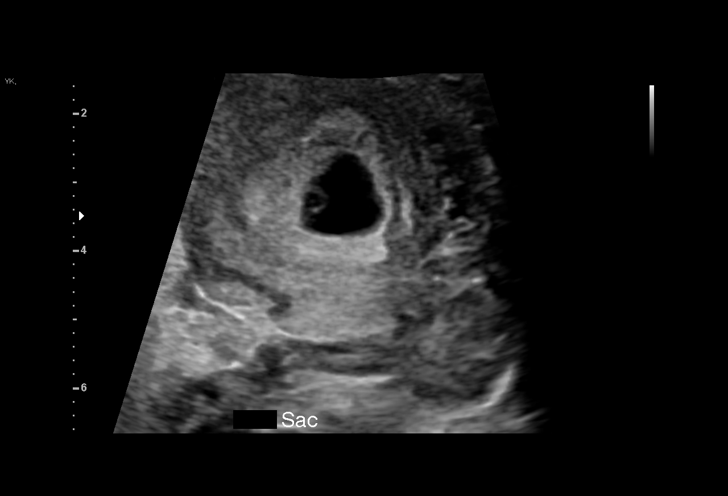
[im 19/34]
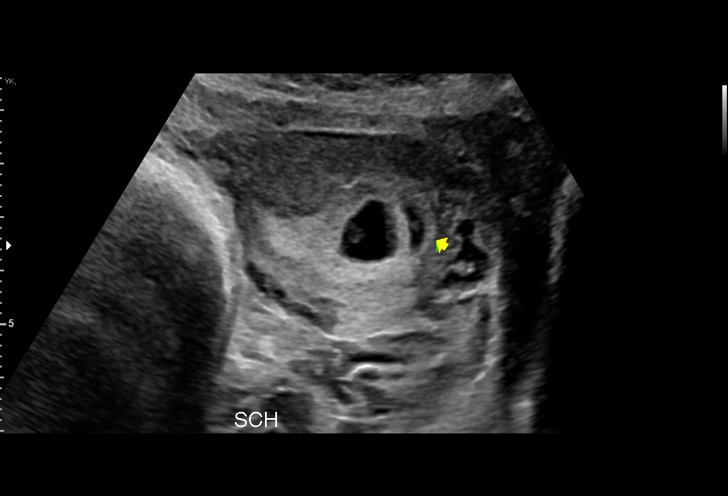
[im 21/34]
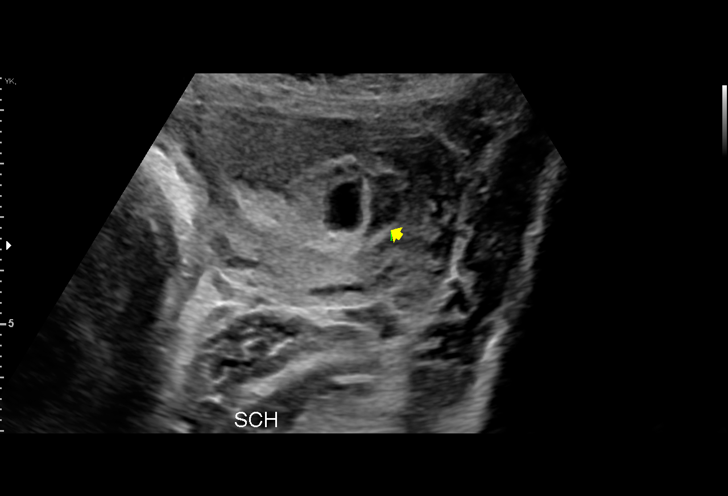
[im 24/34]
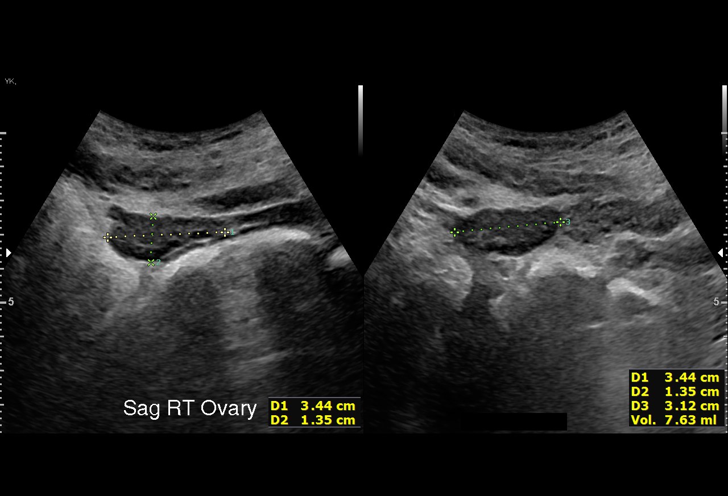
[im 26/34]
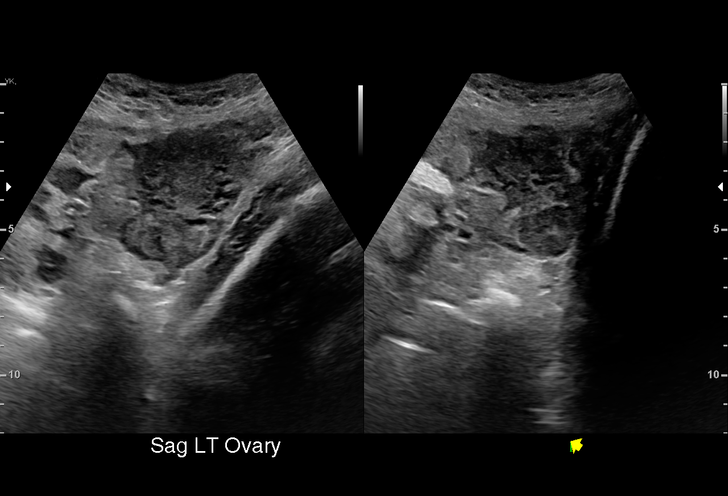
[im 29/34]
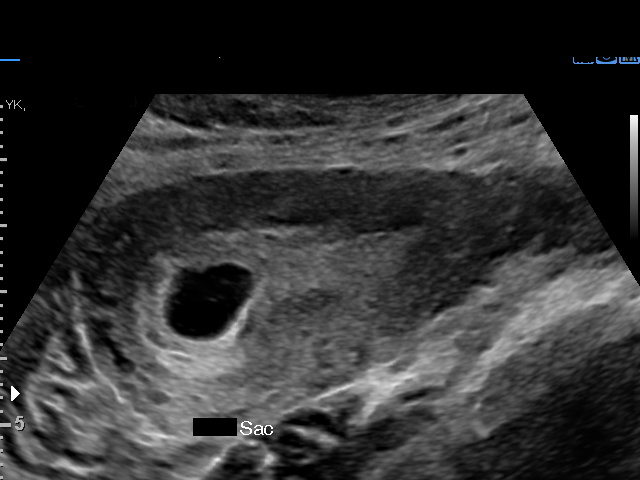
[im 31/34]
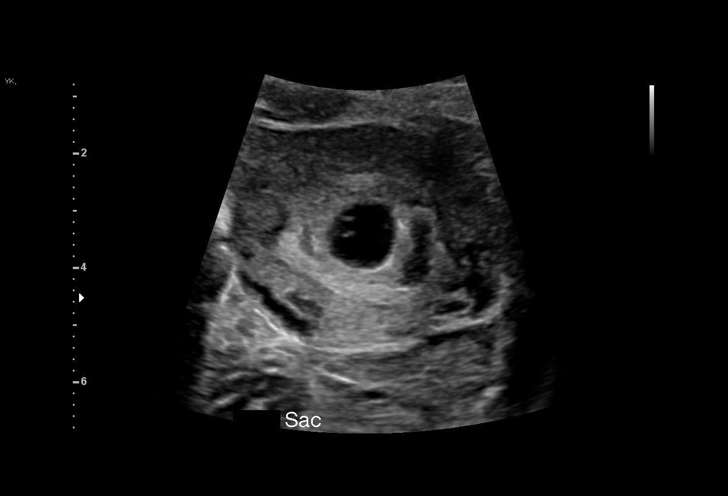
[im 34/34]
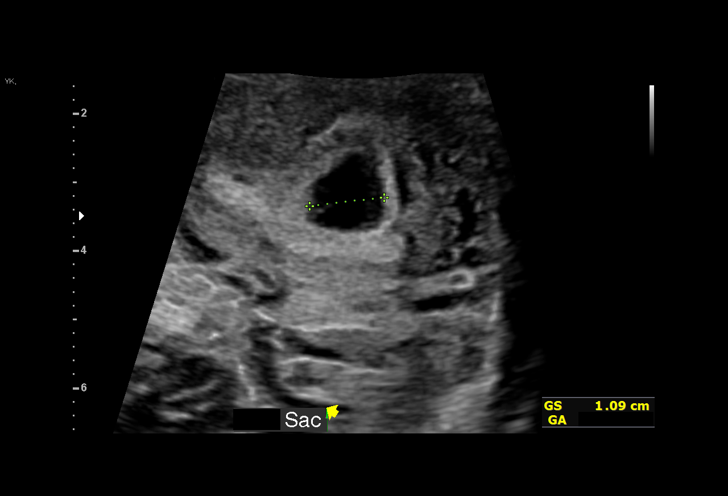

[15 of 28 positions shown; findings below may reference images not displayed]

FINDINGS: Intrauterine gestational sac: Single

Yolk sac:  Visualized.

Embryo:  Not Visualized.

Cardiac Activity: Not Visualized.

MSD: 11.5 mm   5 w   6 d

Subchorionic hemorrhage:  Small subchronic hemorrhage.

Maternal uterus/adnexae: No uterine masses. Normal ovaries. No
visualized adnexal masses, assessment somewhat limited by bowel gas.
No free fluid.
IMPRESSION: 1. Finding consistent with an early intrauterine pregnancy, with a
gestational sac and yolk sac, but no visualized embryo. Recommend
correlation with the beta HCG level and follow-up ultrasound imaging
in 10-14 days to document normal pregnancy progression.
2. Small subchronic hemorrhage.  No other pregnancy complication.

## 2023-06-29 ENCOUNTER — Encounter (HOSPITAL_COMMUNITY): Payer: Self-pay | Admitting: *Deleted

## 2023-06-29 ENCOUNTER — Inpatient Hospital Stay (HOSPITAL_COMMUNITY)
Admission: AD | Admit: 2023-06-29 | Discharge: 2023-06-29 | Disposition: A | Discharge status: Home / Self Care | Attending: Obstetrics & Gynecology | Admitting: Obstetrics & Gynecology

## 2023-06-29 DIAGNOSIS — Z3202 Encounter for pregnancy test, result negative: Secondary | ICD-10-CM | POA: Insufficient documentation

## 2023-06-29 DIAGNOSIS — N912 Amenorrhea, unspecified: Secondary | ICD-10-CM | POA: Insufficient documentation

## 2023-06-29 LAB — URINALYSIS, ROUTINE W REFLEX MICROSCOPIC
Bilirubin Urine: NEGATIVE
Glucose, UA: NEGATIVE mg/dL
Ketones, ur: 15 mg/dL — AB
Nitrite: NEGATIVE
Protein, ur: 30 mg/dL — AB
Specific Gravity, Urine: 1.03 (ref 1.005–1.030)
pH: 5.5 (ref 5.0–8.0)

## 2023-06-29 LAB — URINALYSIS, MICROSCOPIC (REFLEX): RBC / HPF: >50 RBC/hpf (ref 0–5)

## 2023-06-29 LAB — POCT PREGNANCY, URINE: Preg Test, Ur: NEGATIVE

## 2023-06-29 LAB — HCG, QUANTITATIVE, PREGNANCY: hCG, Beta Chain, Quant, S: 7 m[IU]/mL — ABNORMAL HIGH (ref <5)

## 2023-06-29 NOTE — MAU Note (Signed)
 MAU Triage Note  .Ruth Solomon is a 23 y.o. at Unknown here in MAU reporting having positive upt yesterday and started bleeding today. Did have intercourse last night. Occ mild cramping but states is not really painful  LMP: 05/30/23 Onset of complaint: tonight Pain score: 0 Vitals:   06/29/23 2046 06/29/23 2047  BP:  109/69  Pulse: 69   Resp: 17   Temp: 97.9 F (36.6 C)   SpO2: 100%      FHT: n/a  Lab orders placed from triage: upt and u/a

## 2023-06-29 NOTE — MAU Provider Note (Signed)
 Event Date/Time   First Provider Initiated Contact with Patient 06/29/23 2111      S [name] is a 23 y.o. [phone] patient who presents to MAU today with complaint of positive UPT.   She reports she tracks her period and it states she is 2 days late for her cycle.  She states she took 3 home UPT yesterday that were positive.  She reports intermittent cramping and reports sex last night. She reports vaginal bleeding that started around 1930. She states it is bright red and without clots.   O BP 109/69   Pulse 69   Temp 97.9 F (36.6 C)   Resp 17   Ht 5\' 2"  (1.575 m)   Wt 75.3 kg   SpO2 100%   Breastfeeding Unknown   BMI 30.36 kg/m  Results for orders placed or performed during the hospital encounter of 06/29/23 (from the past 24 hours)  Pregnancy, urine POC     Status: None   Collection Time: 06/29/23  9:00 PM  Result Value Ref Range   Preg Test, Ur NEGATIVE NEGATIVE  Urinalysis, Routine w reflex microscopic -Urine, Clean Catch     Status: Abnormal   Collection Time: 06/29/23  9:03 PM  Result Value Ref Range   Color, Urine PINK (A) YELLOW   APPearance CLOUDY (A) CLEAR   Specific Gravity, Urine 1.030 1.005 - 1.030   pH 5.5 5.0 - 8.0   Glucose, UA NEGATIVE NEGATIVE mg/dL   Hgb urine dipstick LARGE (A) NEGATIVE   Bilirubin Urine NEGATIVE NEGATIVE   Ketones, ur 15 (A) NEGATIVE mg/dL   Protein, ur 30 (A) NEGATIVE mg/dL   Nitrite NEGATIVE NEGATIVE   Leukocytes,Ua TRACE (A) NEGATIVE  Urinalysis, Microscopic (reflex)     Status: Abnormal   Collection Time: 06/29/23  9:03 PM  Result Value Ref Range   RBC / HPF >50 0 - 5 RBC/hpf   WBC, UA 0-5 0 - 5 WBC/hpf   Bacteria, UA RARE (A) NONE SEEN   Squamous Epithelial / HPF 6-10 0 - 5 /HPF   Mucus PRESENT   hCG, quantitative, pregnancy     Status: Abnormal   Collection Time: 06/29/23  9:42 PM  Result Value Ref Range   hCG, Beta Chain, Quant, S 7 (H) <5 mIU/mL    Physical Exam Vitals reviewed.  Constitutional:       Appearance: Normal appearance.  HENT:     Head: Normocephalic and atraumatic.  Eyes:     Conjunctiva/sclera: Conjunctivae normal.  Cardiovascular:     Rate and Rhythm: Normal rate.  Musculoskeletal:     Cervical back: Normal range of motion.  Skin:    General: Skin is warm and dry.  Neurological:     Mental Status: She is alert and oriented to person, place, and time.  Psychiatric:        Mood and Affect: Mood normal.        Behavior: Behavior normal.      A Medical screening exam complete Negative UPT  P -Informed of negative UPT today. -Discussed obtaining hCG level to further evaluate. -Will plan to send home and follow up with mycharge message or phone call depending on lab value.  -Patient agreeable and without questions.  -Precautions reviewed. -Patient may return to MAU as needed   Ruth Solomon, [place] 06/29/2023 9:12 PM   Addendum (3:04 AM) -hCG returns at 7. -Mychart message sent to patient instructing her to go to office on Monday for repeat labs. -Patient  appt for Friday May 2nd  rescheduled for Monday May 5th.  Ruth Peru MSN, CNM Advanced Practice Provider, [facility] [employer] [facility]

## 2023-06-30 ENCOUNTER — Encounter (HOSPITAL_BASED_OUTPATIENT_CLINIC_OR_DEPARTMENT_OTHER)

## 2023-06-30 ENCOUNTER — Encounter (HOSPITAL_BASED_OUTPATIENT_CLINIC_OR_DEPARTMENT_OTHER): Payer: Self-pay | Admitting: Obstetrics & Gynecology

## 2023-06-30 NOTE — Telephone Encounter (Signed)
 Called patient in response to MyChart message.  She was sent in the maternity admissions unit you yesterday after having 3 positive pregnancy tests at home.  Urine pregnancy test was negative.  hCG level was 7.  She started bleeding yesterday as well.  It is not heavy at this point.  Discussed this is likely a miscarriage/chemical pregnancy.  She does need her hCG level repeated on Monday.  She is already scheduled for nurse visit for this.  Maternal blood type is O+.  Bleeding precautions given.  Patient voiced appreciation for phone call.

## 2023-07-03 ENCOUNTER — Other Ambulatory Visit (HOSPITAL_BASED_OUTPATIENT_CLINIC_OR_DEPARTMENT_OTHER)

## 2023-07-03 DIAGNOSIS — Z3202 Encounter for pregnancy test, result negative: Secondary | ICD-10-CM

## 2023-07-04 ENCOUNTER — Encounter (HOSPITAL_BASED_OUTPATIENT_CLINIC_OR_DEPARTMENT_OTHER): Payer: Self-pay | Admitting: Obstetrics & Gynecology

## 2023-07-04 LAB — BETA HCG QUANT (REF LAB): hCG Quant: <1 m[IU]/mL

## 2023-09-27 ENCOUNTER — Ambulatory Visit (HOSPITAL_BASED_OUTPATIENT_CLINIC_OR_DEPARTMENT_OTHER): Admitting: Family Medicine

## 2023-09-27 VITALS — BP 115/68 | HR 82 | Ht 62.0 in | Wt 168.6 lb

## 2023-09-27 DIAGNOSIS — Z3201 Encounter for pregnancy test, result positive: Secondary | ICD-10-CM

## 2023-09-27 DIAGNOSIS — Z13220 Encounter for screening for lipoid disorders: Secondary | ICD-10-CM | POA: Diagnosis not present

## 2023-09-27 DIAGNOSIS — Z0000 Encounter for general adult medical examination without abnormal findings: Secondary | ICD-10-CM | POA: Diagnosis not present

## 2023-09-27 LAB — POCT URINE PREGNANCY: Preg Test, Ur: POSITIVE — AB

## 2023-09-27 NOTE — Patient Instructions (Addendum)
  Please pick this up over the counter for nausea.  Vitamin B6 (Pyridoxine ):  Oral: 12.5 to 25 mg three times a day as needed for nausea in pregnancy.    Please take a prenatal vitamin daily. You can pick this up over the counter.    Please ask to have your primary care doctor's labs drawn at the time of your blood draw.

## 2023-09-27 NOTE — Progress Notes (Signed)
 Subjective:   Ruth Solomon May 24, 2000  09/27/2023   CC: Chief Complaint  Patient presents with   Annual Exam    Patient is here today for her physical. States she took a home pregnancy test today which came back positive and is asking for something for nausea.    HPI: Ruth Solomon is a 23 y.o. female who presents for a routine health maintenance exam.  Labs collected at time of visit.    MISSED PERIOD:  Patient's last menstrual period was 08/22/2023 (approximate).Patient states she took a pregnancy test on Friday (7/25) and this morning 7/30 and states both were positive. Reports recent miscarriage in May 2025. Denies abnormal bleeding or spotting, cramping. She states she has been having mild nausea and requesting medication.    HEALTH SCREENINGS: - Vision Screening: not applicable - Dental Visits: Recommended - Pap smear: up to date - Breast Exam: Declined - STD Screening: Declined - Mammogram (40+): Not applicable  - Colonoscopy (45+): Not applicable  - Bone Density (65+ or under 65 with predisposing conditions): Not applicable  - Lung CA screening with low-dose CT:  Not applicable Adults age 40-80 who are current cigarette smokers or quit within the last 15 years. Must have 20 pack year history.   Depression and Anxiety Screen done today and results listed below:     09/27/2023    8:31 AM 12/06/2021    4:20 PM 09/07/2021   10:41 AM 05/12/2021    1:51 PM 06/29/2020   10:03 AM  Depression screen PHQ 2/9  Decreased Interest 0 0 0 1 2  Down, Depressed, Hopeless 0 0 0 0 0  PHQ - 2 Score 0 0 0 1 2  Altered sleeping 0  1 1 0  Tired, decreased energy 0  1 1 1   Change in appetite 0  0 1 1  Feeling bad or failure about yourself  0  0 0 0  Trouble concentrating 0  0 0 0  Moving slowly or fidgety/restless 0  0 0 0  Suicidal thoughts 0  0 0 0  PHQ-9 Score 0  2 4 4   Difficult doing work/chores Not difficult at all    Not difficult at all      09/27/2023    8:31 AM  09/07/2021   10:41 AM 05/12/2021    1:51 PM 06/29/2020   10:04 AM  GAD 7 : Generalized Anxiety Score  Nervous, Anxious, on Edge 0 1 0 0  Control/stop worrying 0 0 0 0  Worry too much - different things 0 1 0 0  Trouble relaxing 0 0 1 1  Restless 0 0 0 0  Easily annoyed or irritable 0 1 0 1  Afraid - awful might happen 0 0 0 0  Total GAD 7 Score 0 3 1 2   Anxiety Difficulty Not difficult at all       IMMUNIZATIONS: - Tdap: Tetanus vaccination status reviewed: last tetanus booster within 10 years. - HPV: Recommend obtaining 3rd HPV after pregnancy  - Influenza: Postponed to flu season - Pneumovax: Not applicable - Prevnar 20: Not applicable - Shingrix (50+): Not applicable   Past medical history, surgical history, medications, allergies, family history and social history reviewed with patient today and changes made to appropriate areas of the chart.   Past Medical History:  Diagnosis Date   Alpha thalassemia silent carrier    Benign gestational thrombocytopenia in third trimester (HCC) 04/10/2019   [x ] f/u heme referral: done 2/24: fu heme  onc labs. Will see 6wks PP  04/10/19: verified by microscope  CBC Latest Ref Rng & Units 04/10/2019 03/25/2019 12/06/2018  WBC 3.4 - 10.8 x10E3/uL 10.1 - -  Hemoglobin 11.1 - 15.9 g/dL 88.3 88.0 87.7  Hematocrit 34.0 - 46.6 % 33.9(L) 37 37  Platelets 150 - 450 x10E3/uL 92(LL) - 103      Delivery of pregnancy by cesarean section 06/01/2019   Postpartum anemia 06/01/2019    Past Surgical History:  Procedure Laterality Date   CESAREAN SECTION N/A 05/30/2019   Procedure: CESAREAN SECTION;  Surgeon: Eveline Lynwood MATSU, MD;  Location: [facility];  Service: Obstetrics;  Laterality: N/A;   CESAREAN SECTION N/A 10/26/2021   Procedure: CESAREAN SECTION;  Surgeon: Barbra Lang PARAS, DO;  Location: [facility];  Service: Obstetrics;  Laterality: N/A;   FOREIGN BODY REMOVAL Left    sewing needle removed from big toe    No current outpatient medications on file prior  to visit.   No current facility-administered medications on file prior to visit.    No Known Allergies   Social History   Socioeconomic History   Marital status: Single    Spouse name: Not on file   Number of children: Not on file   Years of education: Not on file   Highest education level: Some college, no degree  Occupational History   Not on file  Tobacco Use   Smoking status: Never   Smokeless tobacco: Never  Vaping Use   Vaping status: Never Used  Substance and Sexual Activity   Alcohol use: Never   Drug use: Not Currently    Types: Marijuana    Comment: last use a few months ago   Sexual activity: Yes    Birth control/protection: None  Other Topics Concern   Not on file  Social History Narrative   Not on file   Social Drivers of Health   Financial Resource Strain: Low Risk  (09/27/2023)   Overall Financial Resource Strain (CARDIA)    Difficulty of Paying Living Expenses: Not hard at all  Food Insecurity: No Food Insecurity (09/27/2023)   Hunger Vital Sign    Worried About Running Out of Food in the Last Year: Never true    Ran Out of Food in the Last Year: Never true  Transportation Needs: No Transportation Needs (09/27/2023)   PRAPARE - Administrator, Civil Service (Medical): No    Lack of Transportation (Non-Medical): No  Physical Activity: Inactive (09/27/2023)   Exercise Vital Sign    Days of Exercise per Week: 0 days    Minutes of Exercise per Session: Not on file  Stress: No Stress Concern Present (09/27/2023)   [employer] of Occupational Health - Occupational Stress Questionnaire    Feeling of Stress: Not at all  Social Connections: Moderately Integrated (09/27/2023)   Social Connection and Isolation Panel    Frequency of Communication with Friends and Family: More than three times a week    Frequency of Social Gatherings with Friends and Family: Once a week    Attends Religious Services: 1 to 4 times per year    Active Member of  [employer] or Organizations: No    Attends Banker Meetings: Not on file    Marital Status: Living with partner  Intimate Partner Violence: Not At Risk (05/12/2021)   Humiliation, Afraid, Rape, and Kick questionnaire    Fear of Current or Ex-Partner: No    Emotionally Abused: No  Physically Abused: No    Sexually Abused: No   Social History   Tobacco Use  Smoking Status Never  Smokeless Tobacco Never   Social History   Substance and Sexual Activity  Alcohol Use Never    Family History  Problem Relation Age of Onset   Healthy Mother    Hypertension Father    Diabetes Father    Diabetes Maternal Grandmother    Hypertension Paternal Grandmother    Diabetes Paternal Grandmother    Hypertension Paternal Grandfather    Diabetes Paternal Grandfather      ROS: Denies fever, fatigue, unexplained weight loss/gain, chest pain, SHOB, and palpitations. Denies neurological deficits, gastrointestinal or genitourinary complaints, and skin changes.   Objective:   Today's Vitals   09/27/23 0820  BP: 115/68  Pulse: 82  SpO2: 100%  Weight: 168 lb 9.6 oz (76.5 kg)  Height: 5' 2 (1.575 m)    GENERAL APPEARANCE: Well-appearing, in NAD. Well nourished.  SKIN: Pink, warm and dry. Turgor normal. No rash, lesion, ulceration, or ecchymoses. Hair evenly distributed.  HEENT: HEAD: Normocephalic.  EYES: PERRLA. EOMI. Lids intact w/o defect. Sclera white, Conjunctiva pink w/o exudate.  EARS: External ear w/o redness, swelling, masses or lesions. EAC clear. TM's intact, translucent w/o bulging, appropriate landmarks visualized. Appropriate acuity to conversational tones.  NOSE: Septum midline w/o deformity. Nares patent, mucosa pink and non-inflamed w/o drainage. No sinus tenderness.  THROAT: Uvula midline. Oropharynx clear. Tonsils non-inflamed w/o exudate. Oral mucosa pink and moist.  NECK: Supple, Trachea midline. Full ROM w/o pain or tenderness. No lymphadenopathy. Thyroid   non-tender w/o enlargement or palpable masses.  RESPIRATORY: Chest wall symmetrical w/o masses. Respirations even and non-labored. Breath sounds clear to auscultation bilaterally. No wheezes, rales, rhonchi, or crackles. CARDIAC: S1, S2 present, regular rate and rhythm. No gallops, murmurs, rubs, or clicks. PMI w/o lifts, heaves, or thrills. No carotid bruits. Capillary refill <2 seconds. Peripheral pulses 2+ bilaterally. GI: Abdomen soft w/o distention. Normoactive bowel sounds. No palpable masses or tenderness. No guarding or rebound tenderness. Liver and spleen w/o tenderness or enlargement. No CVA tenderness.  MSK: Muscle tone and strength appropriate for age, w/o atrophy or abnormal movement.  EXTREMITIES: Active ROM intact, w/o tenderness, crepitus, or contracture. No obvious joint deformities or effusions. No clubbing, edema, or cyanosis.  NEUROLOGIC: CN's II-XII intact. Motor strength symmetrical with no obvious weakness. No sensory deficits. DTR's 2+ symmetric bilaterally. Steady, even gait.  PSYCH/MENTAL STATUS: Alert, oriented x 3. Cooperative, appropriate mood and affect.     Results for orders placed or performed in visit on 09/27/23  POCT urine pregnancy   Collection Time: 09/27/23  8:48 AM  Result Value Ref Range   Preg Test, Ur Positive (A) Negative    Assessment & Plan:  1. Positive pregnancy test (Primary) Discussed need to start prenatal vitamin and will pick up in pharmacy. Recommend using Vitamin B6 for nausea in pregnancy and reviewed safe use with patient. She is established with Dr. Cleotilde with OBGYN at [facility] and will call to make an appt. Patient verbalized understanding.  - POCT urine pregnancy  2. Annual physical exam Discussed preventative screenings, vaccines, and healthy lifestyle with patient. Will obtain fasting lab with bloodwork drawn at time of OBGYN visit.   3. Screening for lipid disorders - Lipid panel; Future   Orders Placed This Encounter   Procedures   Lipid panel    Standing Status:   Future    Expiration Date:   09/26/2024   POCT  urine pregnancy    PATIENT COUNSELING:  - Encouraged a healthy well-balanced diet. Patient may adjust caloric intake to maintain or achieve ideal body weight. May reduce intake of dietary saturated fat and total fat and have adequate dietary potassium and calcium  preferably from fresh fruits, vegetables, and low-fat dairy products.   - Advised to avoid cigarette smoking. - Discussed with the patient that most people either abstain from alcohol or drink within safe limits (<=14/week and <=4 drinks/occasion for males, <=7/weeks and <= 3 drinks/occasion for females) and that the risk for alcohol disorders and other health effects rises proportionally with the number of drinks per week and how often a drinker exceeds daily limits. - Discussed cessation/primary prevention of drug use and availability of treatment for abuse.  - Discussed sexually transmitted diseases, avoidance of unintended pregnancy and contraceptive alternatives.  - Stressed the importance of regular exercise - Injury prevention: Discussed safety belts, safety helmets, smoke detector, smoking near bedding or upholstery.  - Dental health: Discussed importance of regular tooth brushing, flossing, and dental visits.   NEXT PREVENTATIVE PHYSICAL DUE IN 1 YEAR.  Return in about 1 year (around 09/26/2024) for ANNUAL PHYSICAL.  Patient to reach out to office if new, worrisome, or unresolved symptoms arise or if no improvement in patient's condition. Patient verbalized understanding and is agreeable to treatment plan. All questions answered to patient's satisfaction.    Thersia Schuyler Stark, [place]

## 2023-09-28 ENCOUNTER — Encounter (HOSPITAL_BASED_OUTPATIENT_CLINIC_OR_DEPARTMENT_OTHER): Payer: Self-pay

## 2023-09-28 ENCOUNTER — Other Ambulatory Visit (HOSPITAL_BASED_OUTPATIENT_CLINIC_OR_DEPARTMENT_OTHER): Payer: Self-pay | Admitting: Obstetrics & Gynecology

## 2023-09-28 DIAGNOSIS — O3680X0 Pregnancy with inconclusive fetal viability, not applicable or unspecified: Secondary | ICD-10-CM

## 2023-10-01 ENCOUNTER — Inpatient Hospital Stay (HOSPITAL_COMMUNITY)

## 2023-10-01 ENCOUNTER — Other Ambulatory Visit: Payer: Self-pay

## 2023-10-01 ENCOUNTER — Encounter (HOSPITAL_COMMUNITY): Payer: Self-pay | Admitting: *Deleted

## 2023-10-01 ENCOUNTER — Inpatient Hospital Stay (HOSPITAL_COMMUNITY)
Admission: AD | Admit: 2023-10-01 | Discharge: 2023-10-01 | Disposition: A | Discharge status: Home / Self Care | Attending: Obstetrics & Gynecology | Admitting: Obstetrics & Gynecology

## 2023-10-01 DIAGNOSIS — O219 Vomiting of pregnancy, unspecified: Secondary | ICD-10-CM

## 2023-10-01 DIAGNOSIS — Z3A01 Less than 8 weeks gestation of pregnancy: Secondary | ICD-10-CM | POA: Diagnosis not present

## 2023-10-01 DIAGNOSIS — O26891 Other specified pregnancy related conditions, first trimester: Secondary | ICD-10-CM | POA: Insufficient documentation

## 2023-10-01 DIAGNOSIS — R1032 Left lower quadrant pain: Secondary | ICD-10-CM | POA: Diagnosis not present

## 2023-10-01 LAB — CBC WITH DIFFERENTIAL/PLATELET
Abs Immature Granulocytes: 0.05 K/uL (ref 0.00–0.07)
Basophils Absolute: 0 K/uL (ref 0.0–0.1)
Basophils Relative: 0 %
Eosinophils Absolute: 0.1 K/uL (ref 0.0–0.5)
Eosinophils Relative: 0 %
HCT: 42.7 % (ref 36.0–46.0)
Hemoglobin: 13.8 g/dL (ref 12.0–15.0)
Immature Granulocytes: 0 %
Lymphocytes Relative: 19 %
Lymphs Abs: 2.1 K/uL (ref 0.7–4.0)
MCH: 26.6 pg (ref 26.0–34.0)
MCHC: 32.3 g/dL (ref 30.0–36.0)
MCV: 82.3 fL (ref 80.0–100.0)
Monocytes Absolute: 0.5 K/uL (ref 0.1–1.0)
Monocytes Relative: 5 %
Neutro Abs: 8.5 K/uL — ABNORMAL HIGH (ref 1.7–7.7)
Neutrophils Relative %: 76 %
Platelets: 132 K/uL — ABNORMAL LOW (ref 150–400)
RBC: 5.19 MIL/uL — ABNORMAL HIGH (ref 3.87–5.11)
RDW: 13.1 % (ref 11.5–15.5)
WBC: 11.2 K/uL — ABNORMAL HIGH (ref 4.0–10.5)
nRBC: 0 % (ref 0.0–0.2)

## 2023-10-01 LAB — COMPREHENSIVE METABOLIC PANEL WITH GFR
ALT: 12 U/L (ref 0–44)
AST: 17 U/L (ref 15–41)
Albumin: 3.6 g/dL (ref 3.5–5.0)
Alkaline Phosphatase: 58 U/L (ref 38–126)
Anion gap: 10 (ref 5–15)
BUN: 14 mg/dL (ref 6–20)
CO2: 23 mmol/L (ref 22–32)
Calcium: 9.3 mg/dL (ref 8.9–10.3)
Chloride: 105 mmol/L (ref 98–111)
Creatinine, Ser: 0.7 mg/dL (ref 0.44–1.00)
GFR, Estimated: >60 mL/min (ref >60)
Glucose, Bld: 76 mg/dL (ref 70–99)
Potassium: 3.7 mmol/L (ref 3.5–5.1)
Sodium: 138 mmol/L (ref 135–145)
Total Bilirubin: 0.6 mg/dL (ref 0.0–1.2)
Total Protein: 7 g/dL (ref 6.5–8.1)

## 2023-10-01 LAB — URINALYSIS, ROUTINE W REFLEX MICROSCOPIC
Bilirubin Urine: NEGATIVE
Glucose, UA: NEGATIVE mg/dL
Hgb urine dipstick: NEGATIVE
Ketones, ur: 5 mg/dL — AB
Leukocytes,Ua: NEGATIVE
Nitrite: NEGATIVE
Protein, ur: NEGATIVE mg/dL
Specific Gravity, Urine: 1.025 (ref 1.005–1.030)
pH: 5 (ref 5.0–8.0)

## 2023-10-01 LAB — WET PREP, GENITAL
Sperm: NONE SEEN
Trich, Wet Prep: NONE SEEN
WBC, Wet Prep HPF POC: <10 (ref <10)
Yeast Wet Prep HPF POC: NONE SEEN

## 2023-10-01 LAB — HCG, QUANTITATIVE, PREGNANCY: hCG, Beta Chain, Quant, S: 98678 m[IU]/mL — ABNORMAL HIGH (ref <5)

## 2023-10-01 LAB — HIV ANTIBODY (ROUTINE TESTING W REFLEX): HIV Screen 4th Generation wRfx: NONREACTIVE

## 2023-10-01 MED ORDER — ONDANSETRON 8 MG PO TBDP
8.0000 mg | ORAL_TABLET | Freq: Three times a day (TID) | ORAL | 1 refills | Status: DC | PRN
Start: 2023-10-01 — End: 2023-11-30

## 2023-10-01 MED ORDER — METOCLOPRAMIDE HCL 10 MG PO TABS: 10.0000 mg | ORAL_TABLET | Freq: Three times a day (TID) | ORAL | 1 refills | Status: DC

## 2023-10-01 NOTE — MAU Provider Note (Signed)
 History     CSN: [id number]  Arrival date and time: 10/01/23 1436   None     Chief Complaint  Patient presents with   Nausea   HPI Ms.Ruth Solomon is a 23 y.o. female [phone] @ [redacted]w[redacted]d here in MAU here with nausea. She reports some vomiting but able to keep down water  and some meals. She reports a recent miscarriage in May and wants to be sure everything is ok. Reports some occasional LLQ abdominal pain that comes ad goes. She has no vaginal bleeding.   No odor or vaginal discharge.   OB History     Gravida  6   Para  2   Term  1   Preterm  1   AB  2   Living  2      SAB      IAB  1   Ectopic      Multiple  0   Live Births  2           Past Medical History:  Diagnosis Date   Alpha thalassemia silent carrier    Benign gestational thrombocytopenia in third trimester (HCC) 04/10/2019   [x ] f/u heme referral: done 2/24: fu heme onc labs. Will see 6wks PP  04/10/19: verified by microscope  CBC Latest Ref Rng & Units 04/10/2019 03/25/2019 12/06/2018  WBC 3.4 - 10.8 x10E3/uL 10.1 - -  Hemoglobin 11.1 - 15.9 g/dL 88.3 88.0 87.7  Hematocrit 34.0 - 46.6 % 33.9(L) 37 37  Platelets 150 - 450 x10E3/uL 92(LL) - 103      Delivery of pregnancy by cesarean section 06/01/2019   Postpartum anemia 06/01/2019    Past Surgical History:  Procedure Laterality Date   CESAREAN SECTION N/A 05/30/2019   Procedure: CESAREAN SECTION;  Surgeon: Eveline Lynwood MATSU, MD;  [facility]: [facility];  Service: Obstetrics;  Laterality: N/A;   CESAREAN SECTION N/A 10/26/2021   Procedure: CESAREAN SECTION;  Surgeon: Barbra Lang PARAS, DO;  Location: [facility];  Service: Obstetrics;  Laterality: N/A;   FOREIGN BODY REMOVAL Left    sewing needle removed from big toe    Family History  Problem Relation Age of Onset   Healthy Mother    Hypertension Father    Diabetes Father    Diabetes Maternal Grandmother    Hypertension Paternal Grandmother    Diabetes Paternal Grandmother    Hypertension  Paternal Grandfather    Diabetes Paternal Grandfather     Social History   Tobacco Use   Smoking status: Never   Smokeless tobacco: Never  Vaping Use   Vaping status: Never Used  Substance Use Topics   Alcohol use: Never   Drug use: Not Currently    Types: Marijuana    Comment: last use a few months ago    Allergies: No Known Allergies  No medications prior to admission.   Results for orders placed or performed during the hospital encounter of 10/01/23 (from the past 48 hours)  Urinalysis, Routine w reflex microscopic -Urine, Clean Catch     Status: Abnormal   Collection Time: 10/01/23  2:51 PM  Result Value Ref Range   Color, Urine YELLOW YELLOW   APPearance CLEAR CLEAR   Specific Gravity, Urine 1.025 1.005 - 1.030   pH 5.0 5.0 - 8.0   Glucose, UA NEGATIVE NEGATIVE mg/dL   Hgb urine dipstick NEGATIVE NEGATIVE   Bilirubin Urine NEGATIVE NEGATIVE   Ketones, ur 5 (A) NEGATIVE mg/dL   Protein, ur NEGATIVE  NEGATIVE mg/dL   Nitrite NEGATIVE NEGATIVE   Leukocytes,Ua NEGATIVE NEGATIVE    Comment: Performed at [facility], [address], [place], [place] [address]  Wet prep, genital     Status: Abnormal   Collection Time: 10/01/23  4:12 PM   Specimen: PATH Cytology Cervicovaginal Ancillary Only  Result Value Ref Range   Yeast Wet Prep HPF POC NONE SEEN NONE SEEN   Trich, Wet Prep NONE SEEN NONE SEEN   Clue Cells Wet Prep HPF POC PRESENT (A) NONE SEEN   WBC, Wet Prep HPF POC <10 <10   Sperm NONE SEEN     Comment: Performed at [facility], [address], [place], [place] [address]  GC/Chlamydia probe amp ([place])not at [facility]     Status: None   Collection Time: 10/01/23  4:12 PM  Result Value Ref Range   Neisseria Gonorrhea Negative    Chlamydia Negative    Comment Normal Reference Ranger Chlamydia - Negative    Comment      Normal Reference Range Neisseria Gonorrhea - Negative  CBC with Differential/Platelet     Status: Abnormal   Collection Time:  10/01/23  4:36 PM  Result Value Ref Range   WBC 11.2 (H) 4.0 - 10.5 K/uL   RBC 5.19 (H) 3.87 - 5.11 MIL/uL   Hemoglobin 13.8 12.0 - 15.0 g/dL   HCT 57.2 63.9 - 53.9 %   MCV 82.3 80.0 - 100.0 fL   MCH 26.6 26.0 - 34.0 pg   MCHC 32.3 30.0 - 36.0 g/dL   RDW 86.8 88.4 - 84.4 %   Platelets 132 (L) 150 - 400 K/uL   nRBC 0.0 0.0 - 0.2 %   Neutrophils Relative % 76 %   Neutro Abs 8.5 (H) 1.7 - 7.7 K/uL   Lymphocytes Relative 19 %   Lymphs Abs 2.1 0.7 - 4.0 K/uL   Monocytes Relative 5 %   Monocytes Absolute 0.5 0.1 - 1.0 K/uL   Eosinophils Relative 0 %   Eosinophils Absolute 0.1 0.0 - 0.5 K/uL   Basophils Relative 0 %   Basophils Absolute 0.0 0.0 - 0.1 K/uL   Immature Granulocytes 0 %   Abs Immature Granulocytes 0.05 0.00 - 0.07 K/uL    Comment: Performed at [facility], [address], [place], [place] [address]  Comprehensive metabolic panel with GFR     Status: None   Collection Time: 10/01/23  4:36 PM  Result Value Ref Range   Sodium 138 135 - 145 mmol/L   Potassium 3.7 3.5 - 5.1 mmol/L   Chloride 105 98 - 111 mmol/L   CO2 23 22 - 32 mmol/L   Glucose, Bld 76 70 - 99 mg/dL    Comment: Glucose reference range applies only to samples taken after fasting for at least 8 hours.   BUN 14 6 - 20 mg/dL   Creatinine, Ser 9.29 0.44 - 1.00 mg/dL   Calcium  9.3 8.9 - 10.3 mg/dL   Total Protein 7.0 6.5 - 8.1 g/dL   Albumin 3.6 3.5 - 5.0 g/dL   AST 17 15 - 41 U/L   ALT 12 0 - 44 U/L   Alkaline Phosphatase 58 38 - 126 U/L   Total Bilirubin 0.6 0.0 - 1.2 mg/dL   GFR, Estimated >39 >39 mL/min    Comment: (NOTE) Calculated using the CKD-EPI Creatinine Equation (2021)    Anion gap 10 5 - 15    Comment: Performed at [facility], [address], [place], [place] [address]  hCG, quantitative, pregnancy     Status: Abnormal   Collection Time: 10/01/23  4:36 PM  Result Value Ref Range   hCG, Beta Chain, Quant, S 98,678 (H) <5 mIU/mL    Comment:          GEST. AGE      CONC.   (mIU/mL)   <=1 WEEK        5 - 50     2 WEEKS       50 - 500     3 WEEKS       100 - 10,000     4 WEEKS     1,000 - 30,000     5 WEEKS     3,500 - 115,000   6-8 WEEKS     12,000 - 270,000    12 WEEKS     15,000 - 220,000        FEMALE AND NON-PREGNANT FEMALE:     LESS THAN 5 mIU/mL Performed at [facility], [address] [place], [place] [address]   HIV Antibody (routine testing w rflx)     Status: None   Collection Time: 10/01/23  4:36 PM  Result Value Ref Range   HIV Screen 4th Generation wRfx Non Reactive Non Reactive    Comment: Performed at [facility], [address], [place], [place] [address]    US  OB LESS THAN 14 WEEKS WITH OB TRANSVAGINAL Result Date: 10/01/2023 CLINICAL DATA:  Left lower quadrant pain and vomiting 1 week. Patient known to be pregnant with estimated gestational age [redacted] weeks 5 days per LMP. Quantitative beta HCG pending. Positive urine pregnancy test 09/27/2023. EXAM: OBSTETRIC <14 WK US  AND TRANSVAGINAL OB US  TECHNIQUE: Both transabdominal and transvaginal ultrasound examinations were performed for complete evaluation of the gestation as well as the maternal uterus, adnexal regions, and pelvic cul-de-sac. Transvaginal technique was performed to assess early pregnancy. COMPARISON:  None Available. FINDINGS: Intrauterine gestational sac: Single visualized. Yolk sac:  Visualized. Embryo:  Visualized. Cardiac Activity: Visualized. Heart Rate: 126 bpm CRL: 4.3 mm   6 w   1 d                  US  EDC: 05/25/2024 Subchorionic hemorrhage:  None visualized. Maternal uterus/adnexae: Ovaries are normal size, shape and position with normal color Doppler. Probable corpus luteum over the right ovary. No free pelvic fluid. IMPRESSION: Single live IUP with estimated gestational age [redacted] weeks 1 day. Electronically Signed   By: Toribio Agreste M.D.   On: 10/01/2023 17:17     Review of Systems  Gastrointestinal:  Positive for nausea and vomiting. Negative for abdominal pain.   Genitourinary:  Negative for vaginal bleeding and vaginal discharge.   Physical Exam   Blood pressure 111/68, pulse 67, temperature 98.2 F (36.8 C), temperature source Oral, resp. rate 19, height 5' 2 (1.575 m), weight 76.4 kg, last menstrual period 08/22/2023, SpO2 100%, not currently breastfeeding.  Physical Exam Constitutional:      General: She is not in acute distress.    Appearance: Normal appearance. She is not ill-appearing, toxic-appearing or diaphoretic.  Musculoskeletal:        General: Normal range of motion.  Neurological:     Mental Status: She is alert and oriented to person, place, and time.  Psychiatric:        Behavior: Behavior normal.    MAU Course  Procedures  MDM  UA without signs of dehydration.  Wet prep with clue cells, patient is without symptoms. No odor or discharge, concerned about taking Flagyl  with Nausea/ vomiting.  Assessment and Plan   1. Nausea and vomiting in pregnancy   2. [redacted] weeks gestation of pregnancy      P:  Dc home Rx:  Zofran  and Reglan  Small frequent meals   Delon Emms 10/01/2023, 5:31 PM

## 2023-10-01 NOTE — MAU Note (Signed)
 Ruth Solomon is a 23 y.o. at Unknown here in MAU reporting: she's nauseated and spitting.  Reports she eats and vomits approximately 1 hour after eating.  States she is also having LLQ abdominal pain after vomiting.  States pain is intermittent aching pain that lasts a few minutes.  Denies VB.  LMP: 08/22/2023 Onset of complaint: 1 week Pain score: 3 Vitals:   10/01/23 1450  BP: 111/68  Pulse: 67  Resp: 19  Temp: 98.2 F (36.8 C)  SpO2: 100%     FHT: NA  Lab orders placed from triage: UA

## 2023-10-02 ENCOUNTER — Inpatient Hospital Stay (HOSPITAL_COMMUNITY)
Admission: AD | Admit: 2023-10-02 | Discharge: 2023-10-02 | Disposition: A | Discharge status: Home / Self Care | Attending: Obstetrics and Gynecology | Admitting: Obstetrics and Gynecology

## 2023-10-02 DIAGNOSIS — R208 Other disturbances of skin sensation: Secondary | ICD-10-CM | POA: Insufficient documentation

## 2023-10-02 DIAGNOSIS — R259 Unspecified abnormal involuntary movements: Secondary | ICD-10-CM | POA: Diagnosis not present

## 2023-10-02 DIAGNOSIS — T450X5A Adverse effect of antiallergic and antiemetic drugs, initial encounter: Secondary | ICD-10-CM | POA: Diagnosis not present

## 2023-10-02 DIAGNOSIS — T887XXA Unspecified adverse effect of drug or medicament, initial encounter: Secondary | ICD-10-CM | POA: Diagnosis not present

## 2023-10-02 DIAGNOSIS — Z3A01 Less than 8 weeks gestation of pregnancy: Secondary | ICD-10-CM

## 2023-10-02 DIAGNOSIS — O219 Vomiting of pregnancy, unspecified: Secondary | ICD-10-CM | POA: Diagnosis present

## 2023-10-02 DIAGNOSIS — O9A211 Injury, poisoning and certain other consequences of external causes complicating pregnancy, first trimester: Secondary | ICD-10-CM | POA: Insufficient documentation

## 2023-10-02 LAB — GC/CHLAMYDIA PROBE AMP (~~LOC~~) NOT AT ARMC
Chlamydia: NEGATIVE
Comment: NEGATIVE
Comment: NORMAL
Neisseria Gonorrhea: NEGATIVE

## 2023-10-02 MED ORDER — PROMETHAZINE HCL 12.5 MG PO TABS: 12.5000 mg | ORAL_TABLET | Freq: Four times a day (QID) | ORAL | 0 refills | Status: DC | PRN

## 2023-10-02 NOTE — MAU Provider Note (Signed)
 History     CSN: [id number]  Arrival date and time: 10/02/23 8078   Event Date/Time   First Provider Initiated Contact with Patient 10/02/23 2033      Chief Complaint  Patient presents with   Irritation   HPI.SABRADimitria is at [id number] at [redacted]w[redacted]d with the complaints of increased irritability and involuntary motions with increased feeling of warmth after taking medications for nausea.  Patient reports she was recently seen here in the MAU and treated for nausea and vomiting.  She was sent home on Zofran  and Reglan .  She reports she took both of these this morning and then throughout the day noticed more irritability in her body in terms of inability to scan to sit still she also noticed increased warmth and feeling hot throughout the day.  She reports overall this has been improving while she has been here waiting in the MAU.  She last took her Reglan  at about 9 AM.   She denies any vaginal bleeding, abdominal pain.  She denies any current nausea or vomiting.  OB provider is [facility] -has not had any initial visits in this pregnancy.  OB History     Gravida  6   Para  2   Term  1   Preterm  1   AB  2   Living  2      SAB      IAB  1   Ectopic      Multiple  0   Live Births  2           Past Medical History:  Diagnosis Date   Alpha thalassemia silent carrier    Benign gestational thrombocytopenia in third trimester (HCC) 04/10/2019   [x ] f/u heme referral: done 2/24: fu heme onc labs. Will see 6wks PP  04/10/19: verified by microscope  CBC Latest Ref Rng & Units 04/10/2019 03/25/2019 12/06/2018  WBC 3.4 - 10.8 x10E3/uL 10.1 - -  Hemoglobin 11.1 - 15.9 g/dL 88.3 88.0 87.7  Hematocrit 34.0 - 46.6 % 33.9(L) 37 37  Platelets 150 - 450 x10E3/uL 92(LL) - 103      Delivery of pregnancy by cesarean section 06/01/2019   Postpartum anemia 06/01/2019    Past Surgical History:  Procedure Laterality Date   CESAREAN SECTION N/A 05/30/2019   Procedure: CESAREAN SECTION;  Surgeon:  Eveline Lynwood MATSU, MD;  Location: [facility];  Service: Obstetrics;  Laterality: N/A;   CESAREAN SECTION N/A 10/26/2021   Procedure: CESAREAN SECTION;  Surgeon: Barbra Lang PARAS, DO;  Location: [facility];  Service: Obstetrics;  Laterality: N/A;   FOREIGN BODY REMOVAL Left    sewing needle removed from big toe    Family History  Problem Relation Age of Onset   Healthy Mother    Hypertension Father    Diabetes Father    Diabetes Maternal Grandmother    Hypertension Paternal Grandmother    Diabetes Paternal Grandmother    Hypertension Paternal Grandfather    Diabetes Paternal Grandfather     Social History   Tobacco Use   Smoking status: Never   Smokeless tobacco: Never  Vaping Use   Vaping status: Never Used  Substance Use Topics   Alcohol use: Never   Drug use: Not Currently    Types: Marijuana    Comment: last use a few months ago    Allergies: No Known Allergies  Medications Prior to Admission  Medication Sig Dispense Refill Last Dose/Taking   metoCLOPramide  (REGLAN ) 10 MG tablet  Take 1 tablet (10 mg total) by mouth 4 (four) times daily -  before meals and at bedtime. 120 tablet 1 10/02/2023 at 11:00 AM   ondansetron  (ZOFRAN -ODT) 8 MG disintegrating tablet Take 1 tablet (8 mg total) by mouth every 8 (eight) hours as needed for nausea or vomiting. 20 tablet 1 10/02/2023 at 11:00 AM    Review of Systems  Constitutional:  Negative for chills and fever.  HENT:  Negative for congestion and sore throat.   Eyes:  Negative for pain and visual disturbance.  Respiratory:  Negative for cough, chest tightness and shortness of breath.   Cardiovascular:  Negative for chest pain.  Gastrointestinal:  Negative for abdominal pain, diarrhea, nausea and vomiting.  Endocrine: Negative for cold intolerance and heat intolerance.  Genitourinary:  Negative for dysuria and flank pain.  Musculoskeletal:  Negative for back pain.  Skin:  Negative for rash.  Allergic/Immunologic: Negative for food  allergies.  Neurological:  Negative for dizziness and light-headedness.  Psychiatric/Behavioral:  Negative for agitation.    Physical Exam   Blood pressure 112/62, pulse 74, temperature 98.3 F (36.8 C), temperature source Oral, resp. rate 14, height 5' 2 (1.575 m), weight 76.8 kg, last menstrual period 08/22/2023, SpO2 100%, not currently breastfeeding.  Physical Exam Vitals and nursing note reviewed.  Constitutional:      Appearance: Normal appearance.  HENT:     Head: Normocephalic and atraumatic.     Nose: Nose normal.     Mouth/Throat:     Mouth: Mucous membranes are moist.  Eyes:     Conjunctiva/sclera: Conjunctivae normal.  Cardiovascular:     Rate and Rhythm: Normal rate.  Pulmonary:     Effort: Pulmonary effort is normal.  Abdominal:     General: Abdomen is flat.     Palpations: Abdomen is soft.  Musculoskeletal:     Cervical back: Normal range of motion.  Skin:    General: Skin is warm.     Capillary Refill: Capillary refill takes less than 2 seconds.  Neurological:     General: No focal deficit present.     Mental Status: She is alert.  Psychiatric:        Mood and Affect: Mood normal.     MAU Course  Procedures  MDM- low Differential for current complaint most likely being medication side effect given patient's vital signs stability lack of fever and lack of hemodynamic alteration.  Discussed with patient side effect profile of Reglan .  Overall patient is feeling better after that provider assessment.  Discussed with patient recommendation to discontinue use of Reglan .  She voiced understanding Discussed with patient replacement with Phenergan  as needed for nausea.  Discussed Zofran  use as needed for nausea.  We did review that Zofran  can cause constipation.  Offered scopolamine  patch patient would prefer to try Reglan  at this point.  Disposition:  discharged Assessment and Plan   1. Medication side effect   2. [redacted] weeks gestation of pregnancy   3.  Nausea/vomiting in pregnancy    Discharge stable condition Discontinue Reglan  Prescription for Phenergan  sent to pharmacy Follow-up in the next 4 to 5 weeks to start prenatal care  Future Appointments  Date Time Provider Department Center  11/09/2023 10:15 AM Lo, Arland POUR, CNM [facility]     Suzen Octave [facility] 10/02/2023, 9:34 PM

## 2023-10-02 NOTE — Discharge Instructions (Signed)
 You were seen at the maternity assessment unit for what is most likely a medication side effect of irritability and feeling very hot.  This is most likely caused by the Reglan  that you took this morning.  As your symptoms are overall improving we recommend no intervention at this time.  We do recommend that not taking the Reglan  after this visit.  You can give this to your pharmacy for safe disposal.  We have replaced the Reglan  with a medication called Phenergan  which is safe to take in pregnancy.  You can take this as needed every 6-8 hours.  Of note Phenergan  does cause some sleepiness so be mindful of using this during the day.  Return to the MAU for -Severe nausea and vomiting and inability to hold down liquids for greater than 0212 hrs. -Decreased urine output with no urine in 12 or more hours -Any other pregnancy related concern

## 2023-10-02 NOTE — MAU Note (Addendum)
 Pt says she was here yesterday for nausea- gave her meds .  Started feeling hot and irritable at 6pm-  No nausea-no vomiting

## 2023-10-18 ENCOUNTER — Ambulatory Visit (INDEPENDENT_AMBULATORY_CARE_PROVIDER_SITE_OTHER)

## 2023-10-18 ENCOUNTER — Ambulatory Visit (HOSPITAL_BASED_OUTPATIENT_CLINIC_OR_DEPARTMENT_OTHER): Admitting: Family Medicine

## 2023-10-18 DIAGNOSIS — O3680X0 Pregnancy with inconclusive fetal viability, not applicable or unspecified: Secondary | ICD-10-CM

## 2023-10-18 DIAGNOSIS — Z3A09 9 weeks gestation of pregnancy: Secondary | ICD-10-CM

## 2023-10-22 ENCOUNTER — Ambulatory Visit (HOSPITAL_BASED_OUTPATIENT_CLINIC_OR_DEPARTMENT_OTHER): Payer: Self-pay | Admitting: Obstetrics & Gynecology

## 2023-11-07 ENCOUNTER — Encounter (HOSPITAL_BASED_OUTPATIENT_CLINIC_OR_DEPARTMENT_OTHER): Payer: Self-pay | Admitting: Certified Nurse Midwife

## 2023-11-07 DIAGNOSIS — D563 Thalassemia minor: Secondary | ICD-10-CM | POA: Insufficient documentation

## 2023-11-07 DIAGNOSIS — O09899 Supervision of other high risk pregnancies, unspecified trimester: Secondary | ICD-10-CM | POA: Insufficient documentation

## 2023-11-07 DIAGNOSIS — O34219 Maternal care for unspecified type scar from previous cesarean delivery: Secondary | ICD-10-CM | POA: Insufficient documentation

## 2023-11-09 ENCOUNTER — Encounter (HOSPITAL_BASED_OUTPATIENT_CLINIC_OR_DEPARTMENT_OTHER): Admitting: Certified Nurse Midwife

## 2023-11-09 DIAGNOSIS — D563 Thalassemia minor: Secondary | ICD-10-CM

## 2023-11-09 DIAGNOSIS — O09899 Supervision of other high risk pregnancies, unspecified trimester: Secondary | ICD-10-CM

## 2023-11-09 DIAGNOSIS — O34219 Maternal care for unspecified type scar from previous cesarean delivery: Secondary | ICD-10-CM

## 2023-11-09 DIAGNOSIS — Z862 Personal history of diseases of the blood and blood-forming organs and certain disorders involving the immune mechanism: Secondary | ICD-10-CM

## 2023-11-20 ENCOUNTER — Telehealth (HOSPITAL_BASED_OUTPATIENT_CLINIC_OR_DEPARTMENT_OTHER): Payer: Self-pay

## 2023-11-20 MED ORDER — PROMETHAZINE HCL 12.5 MG PO TABS: 12.5000 mg | ORAL_TABLET | Freq: Four times a day (QID) | ORAL | 0 refills | Status: DC | PRN

## 2023-11-20 NOTE — Telephone Encounter (Signed)
 Patient left a message on the nurse line requesting a medication refill and to discuss what to take for a cold.   Spoke with patient. Patient would like a refill of promethazine  to have on hand for nausea. Also states that she has a cold and was unsure what is safe to take during pregnancy. Advised it is okay to take Mucinex DM or Robitussin DM. Patient verbalizes understanding. Refill for promethazine  sent to [facility] on [address]

## 2023-11-30 ENCOUNTER — Inpatient Hospital Stay (HOSPITAL_COMMUNITY)
Admission: AD | Admit: 2023-11-30 | Discharge: 2023-11-30 | Disposition: A | Discharge status: Home / Self Care | Attending: Obstetrics and Gynecology | Admitting: Obstetrics and Gynecology

## 2023-11-30 ENCOUNTER — Encounter (HOSPITAL_COMMUNITY): Payer: Self-pay | Admitting: Obstetrics and Gynecology

## 2023-11-30 DIAGNOSIS — Z3A14 14 weeks gestation of pregnancy: Secondary | ICD-10-CM | POA: Diagnosis not present

## 2023-11-30 DIAGNOSIS — O219 Vomiting of pregnancy, unspecified: Secondary | ICD-10-CM

## 2023-11-30 LAB — URINALYSIS, ROUTINE W REFLEX MICROSCOPIC
Bacteria, UA: NONE SEEN
Bilirubin Urine: NEGATIVE
Glucose, UA: NEGATIVE mg/dL
Hgb urine dipstick: NEGATIVE
Ketones, ur: NEGATIVE mg/dL
Leukocytes,Ua: NEGATIVE
Nitrite: NEGATIVE
Protein, ur: 30 mg/dL — AB
Specific Gravity, Urine: 1.023 (ref 1.005–1.030)
pH: 5 (ref 5.0–8.0)

## 2023-11-30 MED ORDER — PROMETHAZINE HCL 12.5 MG PO TABS: 12.5000 mg | ORAL_TABLET | Freq: Four times a day (QID) | ORAL | 0 refills | Status: DC | PRN

## 2023-11-30 MED ORDER — ONDANSETRON 4 MG PO TBDP
4.0000 mg | ORAL_TABLET | Freq: Once | ORAL | Status: AC
  Administered 2023-11-30: 4 mg via ORAL
  Filled 2023-11-30: qty 1

## 2023-11-30 MED ORDER — PROMETHAZINE HCL 25 MG PO TABS
25.0000 mg | ORAL_TABLET | Freq: Once | ORAL | Status: AC
  Administered 2023-11-30: 25 mg via ORAL
  Filled 2023-11-30: qty 1

## 2023-11-30 MED ORDER — ONDANSETRON 8 MG PO TBDP: 8.0000 mg | ORAL_TABLET | Freq: Three times a day (TID) | ORAL | 1 refills | Status: DC | PRN

## 2023-11-30 NOTE — Discharge Instructions (Signed)
 It was a pleasure taking care of you today.  I am sorry you are having this nausea.  I sent refills for your Zofran  and your Phenergan  to your pharmacy.  If your symptoms worsen or you have any other concerns please return.  I hope you have a great rest of your day!

## 2023-11-30 NOTE — MAU Provider Note (Signed)
 History     CSN: [id number]  Arrival date and time: 11/30/23 9141   None     Chief Complaint  Patient presents with   Emesis   Fatigue   HPI Patient is a 23 year old G5 P1-1-2-2 at 14 weeks 2 days presenting for nausea and vomiting in pregnancy.  Reports that she has been battling with nausea and vomiting throughout her pregnancy and was taking Compazine  which was helping but ran out of the Compazine .  She has been given Zofran  in the past and reports that does not work as well.  Barbie out of Compazine  2 weeks ago and ran out of Zofran  a week ago.  Reports that the nausea and vomiting got considerably worse yesterday. OB History     Gravida  5   Para  2   Term  1   Preterm  1   AB  2   Living  2      SAB      IAB  1   Ectopic      Multiple  0   Live Births  2        Obstetric Comments  2023 PPROM,  R C/S 2021 c/s, fh dropped after AROM  2022/2024 pills         Past Medical History:  Diagnosis Date   Alpha thalassemia silent carrier    Benign gestational thrombocytopenia in third trimester 04/10/2019   [x ] f/u heme referral: done 2/24: fu heme onc labs. Will see 6wks PP  04/10/19: verified by microscope  CBC Latest Ref Rng & Units 04/10/2019 03/25/2019 12/06/2018  WBC 3.4 - 10.8 x10E3/uL 10.1 - -  Hemoglobin 11.1 - 15.9 g/dL 88.3 88.0 87.7  Hematocrit 34.0 - 46.6 % 33.9(L) 37 37  Platelets 150 - 450 x10E3/uL 92(LL) - 103      Delivery of pregnancy by cesarean section 06/01/2019   Hypertension 2021   after delivery   Postpartum anemia 06/01/2019    Past Surgical History:  Procedure Laterality Date   CESAREAN SECTION N/A 05/30/2019   Procedure: CESAREAN SECTION;  Surgeon: Eveline Lynwood MATSU, MD;  [facility]: [facility];  Service: Obstetrics;  Laterality: N/A;   CESAREAN SECTION N/A 10/26/2021   Procedure: CESAREAN SECTION;  Surgeon: Barbra Lang PARAS, DO;  Location: [facility];  Service: Obstetrics;  Laterality: N/A;   FOREIGN BODY REMOVAL Left    sewing needle  removed from big toe    Family History  Problem Relation Age of Onset   Healthy Mother    Hypertension Father    Diabetes Father    Diabetes Maternal Grandmother    Hypertension Paternal Grandmother    Diabetes Paternal Grandmother    Hypertension Paternal Grandfather    Diabetes Paternal Grandfather     Social History   Tobacco Use   Smoking status: Never   Smokeless tobacco: Never  Vaping Use   Vaping status: Never Used  Substance Use Topics   Alcohol use: Never   Drug use: Not Currently    Types: Marijuana    Comment: last use a few months ago    Allergies: No Known Allergies  Medications Prior to Admission  Medication Sig Dispense Refill Last Dose/Taking   ondansetron  (ZOFRAN -ODT) 8 MG disintegrating tablet Take 1 tablet (8 mg total) by mouth every 8 (eight) hours as needed for nausea or vomiting. 20 tablet 1 Past Month   Prenatal Vit-Fe Fumarate-FA (MULTIVITAMIN-PRENATAL) 27-0.8 MG TABS tablet Take 1 tablet by mouth daily at  12 noon.   11/30/2023 Morning   promethazine  (PHENERGAN ) 12.5 MG tablet Take 1 tablet (12.5 mg total) by mouth every 6 (six) hours as needed for nausea or vomiting. Medication has the side effect of feeling sleepy. Consider use only when at home or at night 30 tablet 0 Past Month    Review of Systems  Gastrointestinal:  Positive for nausea and vomiting.  Genitourinary:  Negative for vaginal bleeding and vaginal discharge.   Physical Exam   Blood pressure 111/67, pulse 83, temperature 98.8 F (37.1 C), temperature source Oral, resp. rate 16, height 5' 2 (1.575 m), weight 75.7 kg, last menstrual period 08/22/2023, SpO2 100%.  Physical Exam Vitals and nursing note reviewed.  Constitutional:      Appearance: Normal appearance.  HENT:     Head: Normocephalic and atraumatic.     Nose: No congestion or rhinorrhea.  Eyes:     Extraocular Movements: Extraocular movements intact.  Cardiovascular:     Rate and Rhythm: Normal rate.  Pulmonary:      Effort: Pulmonary effort is normal.  Abdominal:     Palpations: Abdomen is soft.     Tenderness: There is no abdominal tenderness.  Musculoskeletal:        General: Normal range of motion.     Cervical back: Normal range of motion.  Skin:    General: Skin is warm.     Capillary Refill: Capillary refill takes less than 2 seconds.  Neurological:     General: No focal deficit present.     Mental Status: She is alert.     Cranial Nerves: No cranial nerve deficit.  Psychiatric:        Mood and Affect: Mood normal.        Behavior: Behavior normal.     MAU Course  Procedures  MDM Urinalysis Phenergan  Zofran    Assessment and Plan  Ruth Solomon is a 23 yo G5P2 @ [redacted]w[redacted]d presenting for nausea and vomiting in early pregnancy  Nausea and vomiting early pregnancy Patient presenting with worsening nausea and vomiting.  Previously prescribed Zofran  and Phenergan  which both helped but she ran out of these medications.  Has been unable to keep anything down for the last day.  Patient was initially given Phenergan  with little resolution.  P.o. challenge failed.  Given a dose of Zofran  and symptoms improved and she was able to tolerate solids and liquids.  Will send to patient's pharmacy.  Discussed return precautions.  Patient discharged home.  Ruth Solomon V Ruth Solomon 11/30/2023, 10:30 AM

## 2023-11-30 NOTE — MAU Note (Signed)
 Ruth Solomon is a 23 y.o. at [redacted]w[redacted]d here in MAU reporting: having a lot of vomiting and fatigue.  Had it under control and yesterday it just got bad.  Onset of complaint: yesterday Pain score: none Vitals:   11/30/23 0931  BP: 111/67  Pulse: 83  Resp: 16  Temp: 98.8 F (37.1 C)  SpO2: 100%     FHT:143 Lab orders placed from triage:  urine

## 2023-12-06 ENCOUNTER — Encounter (HOSPITAL_BASED_OUTPATIENT_CLINIC_OR_DEPARTMENT_OTHER): Payer: Self-pay | Admitting: Certified Nurse Midwife

## 2023-12-06 ENCOUNTER — Ambulatory Visit (HOSPITAL_BASED_OUTPATIENT_CLINIC_OR_DEPARTMENT_OTHER): Admitting: Certified Nurse Midwife

## 2023-12-06 VITALS — BP 123/64 | HR 75 | Wt 168.2 lb

## 2023-12-06 DIAGNOSIS — O99212 Obesity complicating pregnancy, second trimester: Secondary | ICD-10-CM | POA: Diagnosis not present

## 2023-12-06 DIAGNOSIS — O34211 Maternal care for low transverse scar from previous cesarean delivery: Secondary | ICD-10-CM

## 2023-12-06 DIAGNOSIS — Z862 Personal history of diseases of the blood and blood-forming organs and certain disorders involving the immune mechanism: Secondary | ICD-10-CM

## 2023-12-06 DIAGNOSIS — O99210 Obesity complicating pregnancy, unspecified trimester: Secondary | ICD-10-CM

## 2023-12-06 DIAGNOSIS — Z3480 Encounter for supervision of other normal pregnancy, unspecified trimester: Secondary | ICD-10-CM | POA: Insufficient documentation

## 2023-12-06 DIAGNOSIS — O09899 Supervision of other high risk pregnancies, unspecified trimester: Secondary | ICD-10-CM

## 2023-12-06 DIAGNOSIS — O34219 Maternal care for unspecified type scar from previous cesarean delivery: Secondary | ICD-10-CM | POA: Diagnosis not present

## 2023-12-06 DIAGNOSIS — E669 Obesity, unspecified: Secondary | ICD-10-CM

## 2023-12-06 DIAGNOSIS — Z3A15 15 weeks gestation of pregnancy: Secondary | ICD-10-CM

## 2023-12-06 DIAGNOSIS — Z1332 Encounter for screening for maternal depression: Secondary | ICD-10-CM

## 2023-12-06 DIAGNOSIS — O09212 Supervision of pregnancy with history of pre-term labor, second trimester: Secondary | ICD-10-CM | POA: Diagnosis not present

## 2023-12-06 DIAGNOSIS — D563 Thalassemia minor: Secondary | ICD-10-CM

## 2023-12-06 MED ORDER — ASPIRIN 81 MG PO TBEC: 81.0000 mg | DELAYED_RELEASE_TABLET | Freq: Every day | ORAL | 12 refills | Status: DC

## 2023-12-06 MED ORDER — BLOOD PRESSURE KIT DEVI: 1.0000 | Freq: Every day | 0 refills | Status: AC

## 2023-12-06 NOTE — Progress Notes (Signed)
 New OB Intake  Pt had not completed forms (Pregnancy Risk Screening Form, New Ob Questionnaire, Infection Screening, Opioid Risk Tool). She will complete and bring to next prenatal visit. These will need to be entered at next prenatal visit.   I explained I am completing New OB Intake today. We discussed EDD of 05/28/24  based on US . Pt is [id number]. I reviewed her allergies, medications and Medical/Surgical/OB history.    Patient Active Problem List   Diagnosis Date Noted   Pregnancy with history of cesarean section, antepartum 12/06/2023   Supervision of other normal pregnancy, antepartum 12/06/2023   History of preterm delivery, currently pregnant, unspecified trimester 11/07/2023   Prior CS x 2 11/07/2023   Alpha thalassemia silent carrier 11/07/2023   History of thrombocytopenia 05/13/2021     Concerns addressed today  Delivery Plans Plans to deliver at [facility]. Discussed the nature of our practice with multiple providers including residents and students as well as female and female providers. Due to the size of the practice, the delivering provider may not be the same as those providing prenatal care.   Patient is not interested in water  birth.  MyChart/Babyscripts MyChart access verified. I explained pt will have some visits in office and some virtually. Babyscripts instructions given and order placed. Patient verifies receipt of registration text/e-mail. Account successfully created and app downloaded. If patient is a candidate for Optimized scheduling, add to sticky note.   Blood Pressure Cuff/Weight Scale Blood pressure cuff ordered for patient to pick-up from [employer]. Explained after first prenatal appt pt will check weekly and document in Babyscripts. Patient does have weight scale.  Anatomy US  Explained first scheduled US  will be around 19 weeks.   Is patient a CenteringPregnancy candidate?  Declined  Is patient a Mom+Baby Combined Care candidate?  Declined    If accepted, confirm patient does not intend to move from the area for at least 12 months, then notify Mom+Baby staff  Is patient a candidate for Babyscripts Optimization? Discussed by RN   First visit review I reviewed new OB appt with patient.   Last Pap Diagnosis  Date Value Ref Range Status  05/12/2021   Final   - Negative for Intraepithelial Lesions or Malignancy (NILM)  05/12/2021 - Benign reactive/reparative changes  Final    Arland MARLA Roller, CNM 12/06/2023  11:37 AM     INITIAL PRENATAL VISIT  Subjective:   Ruth Solomon is being seen today for her first obstetrical visit.  This is a planned pregnancy. This is a desired pregnancy.  She is at [redacted]w[redacted]d gestation by LMP confirmed by US .  Her obstetrical history is significant for Prior Csx2. Pt unsure if she would be interested in trial of labor after CS. Pt unsure if she desires permanent female sterilization.  Had PPROM at 34 weeks with 2nd pregnancy (daughter). Relationship with FOB: significant other, living together. Patient does intend to breast feed. Pregnancy history fully reviewed.  Patient reports fatigue, no bleeding, no contractions, no cramping, and no leaking.  Pt instructed to start ASA 81mg  po once daily until delivery   Review of Systems:   Review of Systems  Objective:    Obstetric History OB History  Gravida Para Term Preterm AB Living  5 2 1 1 2 2   SAB IAB Ectopic Multiple Live Births   1  0 2    # Outcome Date GA Lbr Len/2nd Weight Sex Type Anes PTL Lv  5 Current  4 AB 2024          3 Preterm 10/26/21 [redacted]w[redacted]d  4 lb 3.4 oz (1.91 kg) F CS-LTranv Spinal  LIV     Birth Comments: Grossly normal premature female  2 IAB 08/2020          1 Term 05/30/19 [redacted]w[redacted]d  5 lb 6.6 oz (2.455 kg) M CS-LTranv Gen  LIV    Obstetric Comments  2023 PPROM,  R C/S  2021 c/s, fh dropped after AROM   2022/2024 pills    Past Medical History:  Diagnosis Date   Alpha thalassemia silent carrier    Benign  gestational thrombocytopenia in third trimester 04/10/2019   [x ] f/u heme referral: done 2/24: fu heme onc labs. Will see 6wks PP  04/10/19: verified by microscope  CBC Latest Ref Rng & Units 04/10/2019 03/25/2019 12/06/2018  WBC 3.4 - 10.8 x10E3/uL 10.1 - -  Hemoglobin 11.1 - 15.9 g/dL 88.3 88.0 87.7  Hematocrit 34.0 - 46.6 % 33.9(L) 37 37  Platelets 150 - 450 x10E3/uL 92(LL) - 103      Delivery of pregnancy by cesarean section 06/01/2019   Hypertension 2021   after delivery   Postpartum anemia 06/01/2019    Past Surgical History:  Procedure Laterality Date   CESAREAN SECTION N/A 05/30/2019   Procedure: CESAREAN SECTION;  Surgeon: Eveline Lynwood MATSU, MD;  Location: [facility] LD ORS;  Service: Obstetrics;  Laterality: N/A;   CESAREAN SECTION N/A 10/26/2021   Procedure: CESAREAN SECTION;  Surgeon: Barbra Lang PARAS, DO;  Location: [facility] LD ORS;  Service: Obstetrics;  Laterality: N/A;   FOREIGN BODY REMOVAL Left    sewing needle removed from big toe    Current Outpatient Medications on File Prior to Visit  Medication Sig Dispense Refill   ondansetron  (ZOFRAN -ODT) 8 MG disintegrating tablet Take 1 tablet (8 mg total) by mouth every 8 (eight) hours as needed for nausea or vomiting. 20 tablet 1   Prenatal Vit-Fe Fumarate-FA (MULTIVITAMIN-PRENATAL) 27-0.8 MG TABS tablet Take 1 tablet by mouth daily at 12 noon.     promethazine  (PHENERGAN ) 12.5 MG tablet Take 1 tablet (12.5 mg total) by mouth every 6 (six) hours as needed for nausea or vomiting. Medication has the side effect of feeling sleepy. Consider use only when at home or at night (Patient not taking: Reported on 12/06/2023) 30 tablet 0   No current facility-administered medications on file prior to visit.    No Known Allergies  Social History:  reports that she has never smoked. She has never used smokeless tobacco. She reports that she does not currently use drugs after having used the following drugs: Marijuana. She reports that she does not drink  alcohol.  Family History  Problem Relation Age of Onset   Healthy Mother    Hypertension Father    Diabetes Father    Diabetes Maternal Grandmother    Hypertension Paternal Grandmother    Diabetes Paternal Grandmother    Hypertension Paternal Grandfather    Diabetes Paternal Grandfather     The following portions of the patient's history were reviewed and updated as appropriate: allergies, current medications, past family history, past medical history, past social history, past surgical history and problem list.  Review of Systems Review of Systems  Constitutional: Negative.   All other systems reviewed and are negative.     Physical Exam:  BP 123/64   Pulse 75   Wt 168 lb 3.2 oz (76.3 kg)   LMP 08/22/2023 (Approximate)  BMI 30.76 kg/m  CONSTITUTIONAL: Well-developed, well-nourished female in no acute distress.  HENT:  Normocephalic, atraumatic.  Oropharynx is clear and moist EYES: Conjunctivae normal. No scleral icterus.  NECK: Normal range of motion, supple, no masses.  Normal thyroid .  SKIN: Skin is warm and dry. No rash noted. Not diaphoretic. No erythema. No pallor. MUSCULOSKELETAL: Normal range of motion. No tenderness.  No cyanosis, clubbing, or edema.   NEUROLOGIC: Alert and oriented to person, place, and time. Normal muscle tone coordination.  PSYCHIATRIC: Normal mood and affect. Normal behavior. Normal judgment and thought content. CARDIOVASCULAR: Normal heart rate noted, regular rhythm RESPIRATORY: Clear to auscultation bilaterally. Effort and breath sounds normal, no problems with respiration noted.  Fetal Heart Rate (bpm): 144   Movement: Absent       Assessment:    Pregnancy: [id number]  1. [redacted] weeks gestation of pregnancy (Primary) - Continue prenatal vitamin - Start ASA 81mg  po once daily  2. Pregnancy with history of cesarean section, antepartum - Prior CS x 2. Pt aware that Trial of Labor after CS may or may not be an option. She is unsure if she  would prefer Repeat LTCS. - US  MFM OB DETAIL +14 WK; Future  3. Supervision of other normal pregnancy, antepartum - ABO/Rh; Future - Antibody screen; Future - CBC; Future - Hepatitis B surface antigen; Future - HIV Antibody (routine testing w rflx); Future - HIV (Save tube for possible reflex); Future - RPR; Future - Rubella screen; Future - Hepatitis C antibody; Future - PANORAMA PRENATAL TEST - Hemoglobin A1c; Future - Culture, OB Urine; Future - Cervicovaginal ancillary only - Blood Pressure Monitoring (BLOOD PRESSURE KIT) DEVI; 1 Device by Does not apply route daily.  Dispense: 1 each; Refill: 0 - Babyscripts Schedule Optimization - Hemoglobin A1c - Hepatitis C antibody - Rubella screen - RPR - HIV (Save tube for possible reflex) - HIV Antibody (routine testing w rflx) - Hepatitis B surface antigen - CBC - Antibody screen - ABO/Rh - Culture, OB Urine - US  MFM OB DETAIL +14 WK; Future  4. History of preterm delivery, currently pregnant, unspecified trimester - US  MFM OB DETAIL +14 WK; Future  5. Obesity in pregnancy - US  MFM OB DETAIL +14 WK; Future     Plan:     Initial labs drawn. ASA 81mg  po once daily (Rx sent) Prenatal vitamins. Problem list reviewed and updated. Reviewed in detail the nature of the practice with collaborative care between  Genetic screening discussed: NIPS/First trimester screen/Quad/AFP requested. Role of ultrasound in pregnancy discussed; Anatomy US : requested. Amniocentesis discussed: not indicated. Follow up in 4 weeks. Weight gain recommendations per IOM guidelines reviewed: underweight/BMI 18.5 or less > 28 - 40 lbs; normal weight/BMI 18.5 - 24.9 > 25 - 35 lbs; overweight/BMI 25 - 29.9 > 15 - 25 lbs; obese/BMI  30 or more > 11 - 20 lbs.  Discussed clinic routines, schedule of care and testing, genetic screening options, involvement of students and residents under the direct supervision of APPs and doctors and presence of female  providers. Pt verbalized understanding.   Tad Arland POUR, CNM 12/06/2023 12:17 PM

## 2023-12-08 ENCOUNTER — Ambulatory Visit (HOSPITAL_BASED_OUTPATIENT_CLINIC_OR_DEPARTMENT_OTHER): Payer: Self-pay | Admitting: Certified Nurse Midwife

## 2023-12-08 LAB — HEMOGLOBIN A1C
Est. average glucose Bld gHb Est-mCnc: 97 mg/dL
Hgb A1c MFr Bld: 5 % (ref 4.8–5.6)

## 2023-12-08 LAB — HEPATITIS B SURFACE ANTIGEN: Hepatitis B Surface Ag: NEGATIVE

## 2023-12-08 LAB — CBC
Hematocrit: 38.7 % (ref 34.0–46.6)
Hemoglobin: 12.6 g/dL (ref 11.1–15.9)
MCH: 27.2 pg (ref 26.6–33.0)
MCHC: 32.6 g/dL (ref 31.5–35.7)
MCV: 83 fL (ref 79–97)
Platelets: 107 x10E3/uL — ABNORMAL LOW (ref 150–450)
RBC: 4.64 x10E6/uL (ref 3.77–5.28)
RDW: 13.3 % (ref 11.7–15.4)
WBC: 8.5 x10E3/uL (ref 3.4–10.8)

## 2023-12-08 LAB — HEPATITIS C ANTIBODY: Hep C Virus Ab: NONREACTIVE

## 2023-12-08 LAB — ABO/RH: Rh Factor: POSITIVE

## 2023-12-08 LAB — ANTIBODY SCREEN: Antibody Screen: NEGATIVE

## 2023-12-08 LAB — RUBELLA SCREEN: Rubella Antibodies, IGG: 2.42 {index} (ref >0.99)

## 2023-12-08 LAB — RPR: RPR Ser Ql: NONREACTIVE

## 2023-12-08 LAB — HIV ANTIBODY (ROUTINE TESTING W REFLEX): HIV Screen 4th Generation wRfx: NONREACTIVE

## 2023-12-11 LAB — PANORAMA PRENATAL TEST FULL PANEL:PANORAMA TEST PLUS 5 ADDITIONAL MICRODELETIONS: FETAL FRACTION: 10.1

## 2024-01-01 ENCOUNTER — Ambulatory Visit (INDEPENDENT_AMBULATORY_CARE_PROVIDER_SITE_OTHER): Payer: Self-pay | Admitting: Certified Nurse Midwife

## 2024-01-01 VITALS — BP 117/58 | HR 84 | Wt 167.8 lb

## 2024-01-01 DIAGNOSIS — O99012 Anemia complicating pregnancy, second trimester: Secondary | ICD-10-CM | POA: Diagnosis not present

## 2024-01-01 DIAGNOSIS — O34219 Maternal care for unspecified type scar from previous cesarean delivery: Secondary | ICD-10-CM | POA: Diagnosis not present

## 2024-01-01 DIAGNOSIS — Z862 Personal history of diseases of the blood and blood-forming organs and certain disorders involving the immune mechanism: Secondary | ICD-10-CM

## 2024-01-01 DIAGNOSIS — D563 Thalassemia minor: Secondary | ICD-10-CM

## 2024-01-01 DIAGNOSIS — Z3480 Encounter for supervision of other normal pregnancy, unspecified trimester: Secondary | ICD-10-CM

## 2024-01-01 DIAGNOSIS — Z1332 Encounter for screening for maternal depression: Secondary | ICD-10-CM

## 2024-01-01 DIAGNOSIS — O09212 Supervision of pregnancy with history of pre-term labor, second trimester: Secondary | ICD-10-CM | POA: Diagnosis not present

## 2024-01-01 DIAGNOSIS — Z3A18 18 weeks gestation of pregnancy: Secondary | ICD-10-CM

## 2024-01-01 DIAGNOSIS — O09899 Supervision of other high risk pregnancies, unspecified trimester: Secondary | ICD-10-CM

## 2024-01-01 NOTE — Progress Notes (Signed)
 PRENATAL VISIT NOTE  Subjective:  Ruth Solomon is a 23 y.o. H4E8877 at [redacted]w[redacted]d being seen today for ongoing prenatal care.  She is currently monitored for the following issues for this  pregnancy and has History of thrombocytopenia; History of preterm delivery, currently pregnant, unspecified trimester; Prior CS x 2; Alpha thalassemia silent carrier; Pregnancy with history of cesarean section, antepartum; and Supervision of other normal pregnancy, antepartum on their problem list.  Patient reports no bleeding, no contractions, no cramping, no leaking, and pt feels active fetal movements.  Contractions: Not present. Vag. Bleeding: None.  Movement: Present. Denies leaking of fluid.   The following portions of the patient's history were reviewed and updated as appropriate: allergies, current medications, past family history, past medical history, past social history, past surgical history and problem list.   Objective:   Vitals:   01/01/24 1421  BP: (!) 117/58  Pulse: 84  Weight: 167 lb 12.8 oz (76.1 kg)    Fetal Status:  Fetal Heart Rate (bpm): 144   Movement: Present    General: Alert, oriented and cooperative. Patient is in no acute distress.  Skin: Skin is warm and dry. No rash noted.   Cardiovascular: Normal heart rate noted  Respiratory: Normal respiratory effort, no problems with respiration noted  Abdomen: Soft, gravid, appropriate for gestational age.  Pain/Pressure: Present     Pelvic: Cervical exam deferred        Extremities: Normal range of motion.  Edema: None  Mental Status: Normal mood and affect. Normal behavior. Normal judgment and thought content.      12/06/2023   11:09 AM 09/27/2023    8:31 AM 12/06/2021    4:20 PM  Depression screen PHQ 2/9  Decreased Interest 1 0 0  Down, Depressed, Hopeless 0 0 0  PHQ - 2 Score 1 0 0  Altered sleeping  0   Tired, decreased energy  0   Change in appetite  0   Feeling bad or failure about yourself   0   Trouble  concentrating  0   Moving slowly or fidgety/restless  0   Suicidal thoughts  0   PHQ-9 Score  0   Difficult doing work/chores  Not difficult at all         12/06/2023   11:09 AM 09/27/2023    8:31 AM 09/07/2021   10:41 AM 05/12/2021    1:51 PM  GAD 7 : Generalized Anxiety Score  Nervous, Anxious, on Edge 0 0 1 0  Control/stop worrying 0 0 0 0  Worry too much - different things 0 0 1 0  Trouble relaxing 0 0 0 1  Restless 0 0 0 0  Easily annoyed or irritable 1 0 1 0  Afraid - awful might happen 0 0 0 0  Total GAD 7 Score 1 0 3 1  Anxiety Difficulty Somewhat difficult Not difficult at all      Assessment and Plan:  Pregnancy: [id number] at [redacted]w[redacted]d  1. Supervision of other normal pregnancy, antepartum (Primary) - Taking PNV daily - ASA 81mg  po once daily encouraged  2. [redacted] weeks gestation of pregnancy - Declined AFP today  3. Alpha thalassemia silent carrier   4. History of preterm delivery, currently pregnant, unspecified trimester - Hx PPROM 2023, Repeat LTCS 34w1, 4lb 3oz  5. History of thrombocytopenia - Baseline platelet count 107  6. Pregnancy with history of cesarean section, antepartum   7. Prior CS x 2 - Prior CS x 2. Pt  aware that Trial of Labor after CS may or may not be an option. She is unsure if she would prefer Repeat LTCS    Preterm labor symptoms and general obstetric precautions including but not limited to vaginal bleeding, contractions, leaking of fluid and fetal movement were reviewed in detail with the patient. Please refer to After Visit Summary for other counseling recommendations.   No follow-ups on file.  Future Appointments  Date Time Provider Department Center  01/16/2024  7:00 AM [facility] PROVIDER 1 [facility]  01/16/2024  7:30 AM [facility] US4 [facility]  01/29/2024  1:55 PM Leeam Cedrone, Arland POUR, CNM [facility] [address]  02/28/2024  8:30 AM [facility] OBGYN LAB [facility] [address]  02/28/2024  9:35 AM Cleotilde Ronal RAMAN, MD [facility] [address]   03/11/2024  9:35 AM Ambar Raphael, Arland POUR, CNM [facility] [address]  03/25/2024  9:35 AM Melisa Donofrio, Arland POUR, CNM [facility] [address]  04/08/2024  9:35 AM Cleotilde Ronal RAMAN, MD [facility] [address]  04/22/2024  9:35 AM Heydy Montilla, Arland POUR, CNM [facility] [phone] [address]    Arland POUR Roller, CNM

## 2024-01-11 ENCOUNTER — Encounter (HOSPITAL_BASED_OUTPATIENT_CLINIC_OR_DEPARTMENT_OTHER): Payer: Self-pay

## 2024-01-16 ENCOUNTER — Other Ambulatory Visit (HOSPITAL_BASED_OUTPATIENT_CLINIC_OR_DEPARTMENT_OTHER): Payer: Self-pay | Admitting: Certified Nurse Midwife

## 2024-01-16 ENCOUNTER — Ambulatory Visit: Attending: Certified Nurse Midwife

## 2024-01-16 ENCOUNTER — Ambulatory Visit (HOSPITAL_BASED_OUTPATIENT_CLINIC_OR_DEPARTMENT_OTHER): Admitting: Obstetrics and Gynecology

## 2024-01-16 VITALS — BP 111/72

## 2024-01-16 DIAGNOSIS — O09212 Supervision of pregnancy with history of pre-term labor, second trimester: Secondary | ICD-10-CM | POA: Diagnosis not present

## 2024-01-16 DIAGNOSIS — O283 Abnormal ultrasonic finding on antenatal screening of mother: Secondary | ICD-10-CM | POA: Insufficient documentation

## 2024-01-16 DIAGNOSIS — Z3480 Encounter for supervision of other normal pregnancy, unspecified trimester: Secondary | ICD-10-CM

## 2024-01-16 DIAGNOSIS — O09899 Supervision of other high risk pregnancies, unspecified trimester: Secondary | ICD-10-CM

## 2024-01-16 DIAGNOSIS — Z148 Genetic carrier of other disease: Secondary | ICD-10-CM | POA: Insufficient documentation

## 2024-01-16 DIAGNOSIS — Z3A21 21 weeks gestation of pregnancy: Secondary | ICD-10-CM | POA: Insufficient documentation

## 2024-01-16 DIAGNOSIS — O35BXX0 Maternal care for other (suspected) fetal abnormality and damage, fetal cardiac anomalies, not applicable or unspecified: Secondary | ICD-10-CM | POA: Diagnosis not present

## 2024-01-16 DIAGNOSIS — O09892 Supervision of other high risk pregnancies, second trimester: Secondary | ICD-10-CM | POA: Insufficient documentation

## 2024-01-16 DIAGNOSIS — O358XX0 Maternal care for other (suspected) fetal abnormality and damage, not applicable or unspecified: Secondary | ICD-10-CM | POA: Insufficient documentation

## 2024-01-16 DIAGNOSIS — O99210 Obesity complicating pregnancy, unspecified trimester: Secondary | ICD-10-CM

## 2024-01-16 DIAGNOSIS — O34219 Maternal care for unspecified type scar from previous cesarean delivery: Secondary | ICD-10-CM | POA: Diagnosis not present

## 2024-01-16 DIAGNOSIS — O09293 Supervision of pregnancy with other poor reproductive or obstetric history, third trimester: Secondary | ICD-10-CM

## 2024-01-16 DIAGNOSIS — O99013 Anemia complicating pregnancy, third trimester: Secondary | ICD-10-CM

## 2024-01-16 DIAGNOSIS — Z363 Encounter for antenatal screening for malformations: Secondary | ICD-10-CM | POA: Insufficient documentation

## 2024-01-16 DIAGNOSIS — D563 Thalassemia minor: Secondary | ICD-10-CM

## 2024-01-16 DIAGNOSIS — O99212 Obesity complicating pregnancy, second trimester: Secondary | ICD-10-CM | POA: Diagnosis not present

## 2024-01-16 NOTE — Progress Notes (Signed)
 Maternal-Fetal Medicine Consultation  Name: Ruth Solomon  MRN: [id number]  GA: [id number] [redacted]w[redacted]d   Patient is here for fetal anatomy scan.  On cell-free fetal DNA screening, the risks of aneuploidies are not increased.  Obstetrical history is significant for 2 cesarean deliveries.  Her second pregnancy was complicated by preterm premature rupture of membranes (PPROM) and the patient had an elective cesarean delivery at [redacted] weeks gestation.  In 2021, in her first pregnancy, she had a term cesarean delivery because of fetal bradycardia during labor. Ultrasound Fetal biometry is consistent with her previously-established dates. Normal amniotic fluid.  An echogenic intracardiac focus is seen.  No other makers of aneuploidies or fetal structural defects are seen.  Placenta is posterior and there is no evidence of previa or placenta accreta spectrum. We performed transvaginal ultrasound to evaluate cervical length. The cervix measures  Patient understands the limitations of ultrasound in detecting fetal anomalies.  History of PPROM Studies have consistently reported that a history of PPROM is a strong risk factor for recurrence. The Preterm Prediction Study observed that patients with a history of PPROM leading to preterm birth had a threefold higher frequency of PPROM in a subsequent pregnancy compared with those with no such history (13.5 versus 4.1 percent, relative risk [RR] 3.3, 95% CI 2.1-5.2).  Based on her history, she did not have cervical insufficiency in previous pregnancy. I reassured the patient of normal cervical length measurement.  Previous cesarean delivery I reassured the patient of normal placental location and that there is no evidence of previa or placenta accreta spectrum. Repeat cesarean deliveries increase the risks of placenta previa and/or placenta accreta spectrum. Long-term complications include small bowel obstruction.  I discussed the benefits and risks of VBAC.  The risk of  uterine scar dehiscence is about 2% in patients attempting trial of labor after more than one cesarean delivery.  Cesarean deliveries increase the risks of hemorrhage, infection and venous thromboembolism.    Patient is undecided about mode of delivery. Echogenic intracardiac focus (EIF) EIF is present in about 3% to 4% of normal fetuses and in some fetuses with Down syndrome.  Given that she has low risk for fetal Down syndrome on cell-free fetal DNA screening, this should be considered a normal variant.  I did not recommend amniocentesis for this finding.  I counseled the patient that cell free fetal DNA screening has a greater detection rate for Down syndrome. However, only amniocentesis will give a definitive result on the fetal karyotype.  I explained amniocentesis procedure and possible complication of miscarriage (1 and 500 procedures). Patient opted not to have amniocentesis.  Alpha Thalassemia-Silent Carrier I discussed the genetics and autosomal recessive mode of inheritance.  Briefly discussed the likelihood of variants of alpha thalassemia based on her partner's carrier status.  The pregnancy is not at risk if her partner is a silent carrier.  However, if her partner is a carrier in cis configuration (--/aa), there is a 25% chance for hemoglobin H disease. I recommended partner screening and genetic counseling. Patient will consider partner screening.   Recommendations -Follow-up scans as clinically indicated.     Consultation including face-to-face (more than 50%) counseling 45 minutes.

## 2024-01-29 ENCOUNTER — Ambulatory Visit (HOSPITAL_BASED_OUTPATIENT_CLINIC_OR_DEPARTMENT_OTHER): Payer: Self-pay | Admitting: Certified Nurse Midwife

## 2024-01-29 ENCOUNTER — Other Ambulatory Visit (HOSPITAL_BASED_OUTPATIENT_CLINIC_OR_DEPARTMENT_OTHER): Payer: Self-pay | Admitting: Certified Nurse Midwife

## 2024-01-29 VITALS — BP 108/50 | HR 80 | Wt 174.4 lb

## 2024-01-29 DIAGNOSIS — Z862 Personal history of diseases of the blood and blood-forming organs and certain disorders involving the immune mechanism: Secondary | ICD-10-CM | POA: Diagnosis not present

## 2024-01-29 DIAGNOSIS — O09212 Supervision of pregnancy with history of pre-term labor, second trimester: Secondary | ICD-10-CM | POA: Diagnosis not present

## 2024-01-29 DIAGNOSIS — Z3A22 22 weeks gestation of pregnancy: Secondary | ICD-10-CM

## 2024-01-29 DIAGNOSIS — D563 Thalassemia minor: Secondary | ICD-10-CM | POA: Diagnosis not present

## 2024-01-29 DIAGNOSIS — O99112 Other diseases of the blood and blood-forming organs and certain disorders involving the immune mechanism complicating pregnancy, second trimester: Secondary | ICD-10-CM | POA: Diagnosis not present

## 2024-01-29 DIAGNOSIS — O34219 Maternal care for unspecified type scar from previous cesarean delivery: Secondary | ICD-10-CM

## 2024-01-29 DIAGNOSIS — O09899 Supervision of other high risk pregnancies, unspecified trimester: Secondary | ICD-10-CM

## 2024-01-29 DIAGNOSIS — Z3480 Encounter for supervision of other normal pregnancy, unspecified trimester: Secondary | ICD-10-CM

## 2024-01-29 MED ORDER — ASPIRIN 81 MG PO TBEC: 81.0000 mg | DELAYED_RELEASE_TABLET | Freq: Every day | ORAL | 12 refills | Status: AC

## 2024-01-29 NOTE — Progress Notes (Signed)
 PRENATAL VISIT NOTE  Subjective:  Ruth Solomon is a 23 y.o. H4E8877 at [redacted]w[redacted]d being seen today for ongoing prenatal care.  She is currently monitored for the following issues for this  pregnancy and has History of thrombocytopenia; History of preterm delivery, currently pregnant, unspecified trimester; Prior CS x 2; Alpha thalassemia silent carrier; Pregnancy with history of cesarean section, antepartum; and Supervision of other normal pregnancy, antepartum on their problem list.  Patient reports no complaints.  Contractions: Not present. Vag. Bleeding: None.  Movement: Present. Denies leaking of fluid.   The following portions of the patient's history were reviewed and updated as appropriate: allergies, current medications, past family history, past medical history, past social history, past surgical history and problem list.   Objective:   Vitals:   01/29/24 1409  BP: (!) 108/50  Pulse: 80  Weight: 174 lb 6.4 oz (79.1 kg)    Fetal Status:  Fetal Heart Rate (bpm): 150   Movement: Present    General: Alert, oriented and cooperative. Patient is in no acute distress.  Skin: Skin is warm and dry. No rash noted.   Cardiovascular: Normal heart rate noted  Respiratory: Normal respiratory effort, no problems with respiration noted  Abdomen: Soft, gravid, appropriate for gestational age.  Pain/Pressure: Present     Pelvic: Cervical exam deferred        Extremities: Normal range of motion.  Edema: None  Mental Status: Normal mood and affect. Normal behavior. Normal judgment and thought content.      01/01/2024    5:07 PM 12/06/2023   11:09 AM 09/27/2023    8:31 AM  Depression screen PHQ 2/9  Decreased Interest 1 1 0  Down, Depressed, Hopeless 0 0 0  PHQ - 2 Score 1 1 0  Altered sleeping 0  0  Tired, decreased energy 2  0  Change in appetite 0  0  Feeling bad or failure about yourself  0  0  Trouble concentrating 0  0  Moving slowly or fidgety/restless 0  0  Suicidal thoughts 0   0  PHQ-9 Score 3   0   Difficult doing work/chores Somewhat difficult  Not difficult at all     Data saved with a previous flowsheet row definition        01/01/2024    5:07 PM 12/06/2023   11:09 AM 09/27/2023    8:31 AM 09/07/2021   10:41 AM  GAD 7 : Generalized Anxiety Score  Nervous, Anxious, on Edge 0 0 0 1  Control/stop worrying 0 0 0 0  Worry too much - different things 0 0 0 1  Trouble relaxing 0 0 0 0  Restless 0 0 0 0  Easily annoyed or irritable 1 1 0 1  Afraid - awful might happen 0 0 0 0  Total GAD 7 Score 1 1 0 3  Anxiety Difficulty Somewhat difficult Somewhat difficult Not difficult at all     Assessment and Plan:  Pregnancy: H4E8877 at [redacted]w[redacted]d  1. Supervision of other normal pregnancy, antepartum (Primary) - Taking PNV daily - ASA 81mg  po once daily encouraged - Plans Breastfeeding - Researching Pediatricians   2. [redacted] weeks gestation of pregnancy   3. Alpha thalassemia silent carrier    4. History of preterm delivery, currently pregnant, unspecified trimester - Hx PPROM 2023, Repeat LTCS 34w1, 4lb 3oz   5. History of thrombocytopenia - Baseline platelet count 107 - Plan repeat at next prenatal visit   6. Pregnancy with history  of cesarean section, antepartum     7. Prior CS x 2 - Prior CS x 2. Pt aware that Trial of Labor after CS may or may not be an option. Pt considering a trial of labor after CS. Sign consent at next prenatal visit (2hr GTT visit).   Preterm labor symptoms and general obstetric precautions including but not limited to vaginal bleeding, contractions, leaking of fluid and fetal movement were reviewed in detail with the patient. Please refer to After Visit Summary for other counseling recommendations.    Future Appointments  Date Time Provider Department Center  02/28/2024  8:30 AM [facility] [address]  02/28/2024  9:35 AM Cleotilde Ronal RAMAN, MD [facility] [address]  03/11/2024  9:35 AM Cleotilde Ronal RAMAN, MD [facility]  [address]  03/26/2024 10:35 AM Delores Nidia CROME, FNP [facility] [address]  04/08/2024  9:35 AM Cleotilde Ronal RAMAN, MD [facility] [address]  04/22/2024  9:35 AM Areebah Meinders, Arland POUR, CNM [facility] [phone] [address]    Arland POUR Roller, CNM

## 2024-01-29 NOTE — Progress Notes (Signed)
 Needs aspirin 

## 2024-02-07 ENCOUNTER — Encounter (HOSPITAL_BASED_OUTPATIENT_CLINIC_OR_DEPARTMENT_OTHER): Payer: Self-pay

## 2024-02-08 ENCOUNTER — Encounter (HOSPITAL_COMMUNITY): Payer: Self-pay | Admitting: Obstetrics and Gynecology

## 2024-02-08 ENCOUNTER — Inpatient Hospital Stay (HOSPITAL_COMMUNITY)
Admission: AD | Admit: 2024-02-08 | Discharge: 2024-02-08 | Disposition: A | Discharge status: Home / Self Care | Attending: Obstetrics and Gynecology | Admitting: Obstetrics and Gynecology

## 2024-02-08 DIAGNOSIS — Z3A24 24 weeks gestation of pregnancy: Secondary | ICD-10-CM

## 2024-02-08 DIAGNOSIS — N949 Unspecified condition associated with female genital organs and menstrual cycle: Secondary | ICD-10-CM

## 2024-02-08 DIAGNOSIS — Z3689 Encounter for other specified antenatal screening: Secondary | ICD-10-CM

## 2024-02-08 LAB — URINALYSIS, ROUTINE W REFLEX MICROSCOPIC
Bilirubin Urine: NEGATIVE
Glucose, UA: NEGATIVE mg/dL
Hgb urine dipstick: NEGATIVE
Ketones, ur: 5 mg/dL — AB
Nitrite: NEGATIVE
Protein, ur: NEGATIVE mg/dL
Specific Gravity, Urine: 1.025 (ref 1.005–1.030)
pH: 5 (ref 5.0–8.0)

## 2024-02-08 LAB — WET PREP, GENITAL
Clue Cells Wet Prep HPF POC: NONE SEEN
Sperm: NONE SEEN
Trich, Wet Prep: NONE SEEN
WBC, Wet Prep HPF POC: >=10 — AB (ref <10)
Yeast Wet Prep HPF POC: NONE SEEN

## 2024-02-08 MED ORDER — CYCLOBENZAPRINE HCL 5 MG PO TABS
10.0000 mg | ORAL_TABLET | Freq: Once | ORAL | Status: AC
  Administered 2024-02-08: 10 mg via ORAL
  Filled 2024-02-08: qty 2

## 2024-02-08 MED ORDER — ACETAMINOPHEN 500 MG PO TABS
1000.0000 mg | ORAL_TABLET | Freq: Once | ORAL | Status: AC
  Administered 2024-02-08: 1000 mg via ORAL
  Filled 2024-02-08: qty 2

## 2024-02-08 MED ORDER — CYCLOBENZAPRINE HCL 10 MG PO TABS: 10.0000 mg | ORAL_TABLET | Freq: Two times a day (BID) | ORAL | 0 refills | Status: AC | PRN

## 2024-02-08 NOTE — MAU Provider Note (Signed)
 Chief Complaint:  Abdominal Pain   HPI    Ruth Solomon is a 23 y.o. H4E8877 at [redacted]w[redacted]d who presents to maternity admissions reporting lower abdominal cramping B/L. She denies any vaginal bleeding, leaking of fluid, contractions and offers no urinary s/s at this time.  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 Will see 6wks PP  04/10/19: verified by microscope  CBC Latest Ref Rng & Units 04/10/2019 03/25/2019 12/06/2018  WBC 3.4 - 10.8 x10E3/uL 10.1 - -  Hemoglobin 11.1 - 15.9 g/dL 88.3 88.0 87.7  Hematocrit 34.0 - 46.6 % 33.9(L) 37 37  Platelets 150 - 450 x10E3/uL 92(LL) - 103      Delivery of pregnancy by cesarean section 06/01/2019   Hypertension 2021   after delivery   Postpartum anemia 06/01/2019   OB History  Gravida Para Term Preterm AB Living  5 2 1 1 2 2   SAB IAB Ectopic Multiple Live Births   1  0 2    # Outcome Date GA Lbr Len/2nd Weight Sex Type Anes PTL Lv  5 Current           4 AB 2024          3 Preterm 10/26/21 [redacted]w[redacted]d  1910 g F CS-LTranv Spinal  LIV     Birth Comments: Grossly normal premature female  2 IAB 08/2020          1 Term 05/30/19 [redacted]w[redacted]d  2455 g M CS-LTranv Gen  LIV    Obstetric Comments  2023 PPROM,  R C/S  2021 c/s, fh dropped after AROM   2022/2024 pills   Past Surgical History:  Procedure Laterality Date   CESAREAN SECTION N/A 05/30/2019   Procedure: CESAREAN SECTION;  Surgeon: Eveline Lynwood MATSU, MD;  Location: [facility];  Service: Obstetrics;  Laterality: N/A;   CESAREAN SECTION N/A 10/26/2021   Procedure: CESAREAN SECTION;  Surgeon: Barbra Lang PARAS, DO;  Location: [facility];  Service: Obstetrics;  Laterality: N/A;   FOREIGN BODY REMOVAL  Left    sewing needle removed from big toe   Family History  Problem Relation Age of Onset   Healthy Mother    Hypertension Father    Diabetes Father    Diabetes Maternal Grandmother    Hypertension Paternal Grandmother    Diabetes Paternal Grandmother    Hypertension Paternal Grandfather    Diabetes Paternal Grandfather    Social History[1] Allergies[2] Medications Prior to Admission  Medication Sig Dispense Refill Last Dose/Taking   aspirin  EC 81 MG tablet Take 1 tablet (81 mg total) by mouth daily. Swallow whole. (Patient not taking: Reported on 01/29/2024) 30 tablet 12    Blood Pressure Monitoring (BLOOD PRESSURE KIT) DEVI 1 Device by Does not apply route daily. 1 each 0    ondansetron  (ZOFRAN -ODT) 8 MG disintegrating tablet Take 1 tablet (8 mg total) by mouth every 8 (eight) hours as needed for nausea or vomiting. (Patient not taking: Reported on 01/29/2024) 20 tablet 1    Prenatal Vit-Fe Fumarate-FA (MULTIVITAMIN-PRENATAL) 27-0.8 MG TABS tablet Take 1 tablet by mouth daily at 12 noon.      promethazine  (PHENERGAN ) 12.5 MG tablet Take 1 tablet (12.5 mg total) by mouth every  6 (six) hours as needed for nausea or vomiting. Medication has the side effect of feeling sleepy. Consider use only when at home or at night (Patient not taking: Reported on 01/29/2024) 30 tablet 0     I have reviewed patient's Past Medical Hx, Surgical Hx, Family Hx, Social Hx, medications and allergies.   ROS  Pertinent items noted in HPI and remainder of comprehensive ROS otherwise negative.   PHYSICAL EXAM  Patient Vitals for the past 24 hrs:  BP Temp Pulse Resp SpO2 Height Weight  02/08/24 1948 (!) 100/59 -- 77 -- -- -- --  02/08/24 1924 107/66 -- -- -- -- -- --  02/08/24 1921 -- 99.1 F (37.3 C) 76 16 100 % 5' 2 (1.575 m) 80.7 kg    Constitutional: Well-developed, well-nourished female in no acute distress.  Cardiovascular: normal rate & rhythm, warm and well-perfused Respiratory: normal effort,  no problems with respiration noted GI: Abd soft, non-tender, gravid MS: Extremities nontender, no edema, normal ROM Neurologic: Alert and oriented x 4.       Fetal Tracing: Reactive for GA Baseline: 130-135 Variability:Moderate Accelerations: present Decelerations: absent Toco: quite   Labs: Results for orders placed or performed during the hospital encounter of 02/08/24 (from the past 24 hours)  Urinalysis, Routine w reflex microscopic -Urine, Clean Catch     Status: Abnormal   Collection Time: 02/08/24  7:32 PM  Result Value Ref Range   Color, Urine YELLOW YELLOW   APPearance HAZY (A) CLEAR   Specific Gravity, Urine 1.025 1.005 - 1.030   pH 5.0 5.0 - 8.0   Glucose, UA NEGATIVE NEGATIVE mg/dL   Hgb urine dipstick NEGATIVE NEGATIVE   Bilirubin Urine NEGATIVE NEGATIVE   Ketones, ur 5 (A) NEGATIVE mg/dL   Protein, ur NEGATIVE NEGATIVE mg/dL   Nitrite NEGATIVE NEGATIVE   Leukocytes,Ua TRACE (A) NEGATIVE   RBC / HPF 0-5 0 - 5 RBC/hpf   WBC, UA 0-5 0 - 5 WBC/hpf   Bacteria, UA RARE (A) NONE SEEN   Squamous Epithelial / HPF 21-50 0 - 5 /HPF   Mucus PRESENT   Wet prep, genital     Status: Abnormal   Collection Time: 02/08/24  7:52 PM   Specimen: Vaginal  Result Value Ref Range   Yeast Wet Prep HPF POC NONE SEEN NONE SEEN   Trich, Wet Prep NONE SEEN NONE SEEN   Clue Cells Wet Prep HPF POC NONE SEEN NONE SEEN   WBC, Wet Prep HPF POC >=10 (A) <10   Sperm NONE SEEN     Imaging:  No results found.  MDM & MAU COURSE  MDM:  HIGH  Prenatal chart reviewed Physical exam performed Vaginal culture ( Wet Prep negative) GC pending at discharge UA: No evidence of UTI NST for gestational age and use evaluation ( Reactive for GA) Tylenol /Flexeril for discomfort ( Resolve noted)  MAU Course: Orders Placed This Encounter  Procedures   Wet prep, genital   Urinalysis, Routine w reflex microscopic -Urine, Clean Catch   Discharge patient Discharge disposition: 01-Home or Self  Care; Discharge patient date: 02/08/2024   Meds ordered this encounter  Medications   acetaminophen  (TYLENOL ) tablet 1,000 mg   cyclobenzaprine (FLEXERIL) tablet 10 mg   cyclobenzaprine (FLEXERIL) 10 MG tablet    Sig: Take 1 tablet (10 mg total) by mouth 2 (two) times daily as needed for muscle spasms.    Dispense:  20 tablet    Refill:  0    Supervising Provider:  FREDIRICK GLENYS RAMAN [[id number]]    I have reviewed the patient chart and performed the physical exam . I have ordered & interpreted the lab results and reviewed and interpreted the NST Medications ordered as stated below.  A/P as described below.  Counseling and education provided and patient agreeable  with plan as described below. Verbalized understanding.    ASSESSMENT   1. Round ligament pain   2. [redacted] weeks gestation of pregnancy   3. NST (non-stress test) reactive on fetal surveillance     PLAN  Discharge home in stable condition with return precautions.   Maternity support band recommended   See AVS for full description of information given to the patient including both verbal and written. Patient verbalized understanding and agrees with the plan as described above.     Follow-up Information     [facility] Follow up.   Specialty: Obstetrics and Gynecology Why: If symptoms worsen or fail to resolve, As scheduled for ongoing prenatal care Contact information: [address] [place]  [phone]                Allergies as of 02/08/2024   No Known Allergies      Medication List     TAKE these medications    aspirin  EC 81 MG tablet Take 1 tablet (81 mg total) by mouth daily. Swallow whole.   Blood Pressure Kit Devi 1 Device by Does not apply route daily.   cyclobenzaprine 10 MG tablet Commonly known as: FLEXERIL Take 1 tablet (10 mg total) by mouth 2 (two) times daily as needed for muscle spasms.    multivitamin-prenatal 27-0.8 MG Tabs tablet Take 1 tablet by mouth daily at 12 noon.   ondansetron  8 MG disintegrating tablet Commonly known as: ZOFRAN -ODT Take 1 tablet (8 mg total) by mouth every 8 (eight) hours as needed for nausea or vomiting.   promethazine  12.5 MG tablet Commonly known as: PHENERGAN  Take 1 tablet (12.5 mg total) by mouth every 6 (six) hours as needed for nausea or vomiting. Medication has the side effect of feeling sleepy. Consider use only when at home or at night        Olam Dalton, MSN, [facility], [facility] for [facility]    This chart was dictated using voice recognition software, Dragon. Despite the best efforts of this provider to proofread and correct errors, errors may still occur which can change documentation meaning.      [1]  Social History Tobacco Use   Smoking status: Never   Smokeless tobacco: Never  Vaping Use   Vaping status: Never Used  Substance Use Topics   Alcohol use: Never   Drug use: Not Currently    Types: Marijuana    Comment: last use a few months ago  [2] No Known Allergies

## 2024-02-08 NOTE — MAU Note (Signed)
 OK to d/c EFM per Olam Dalton NP

## 2024-02-08 NOTE — MAU Note (Signed)
 Ruth Solomon is a 23 y.o. at [redacted]w[redacted]d here in MAU reporting pressure in lower abd and vaginal area today. Reports good Fm and denies LOF or VB. Pain is intermittent  LMP: na Onset of complaint: today Pain score: 7 Vitals:   02/08/24 1921 02/08/24 1924  BP:  107/66  Pulse: 76   Resp: 16   Temp: 99.1 F (37.3 C)   SpO2: 100%      FHT: 133  Lab orders placed from triage: u/a

## 2024-02-09 LAB — GC/CHLAMYDIA PROBE AMP (~~LOC~~) NOT AT ARMC
Chlamydia: NEGATIVE
Comment: NEGATIVE
Comment: NORMAL
Neisseria Gonorrhea: NEGATIVE

## 2024-02-28 ENCOUNTER — Encounter (HOSPITAL_BASED_OUTPATIENT_CLINIC_OR_DEPARTMENT_OTHER): Payer: Self-pay | Admitting: Obstetrics & Gynecology

## 2024-02-28 ENCOUNTER — Ambulatory Visit (HOSPITAL_BASED_OUTPATIENT_CLINIC_OR_DEPARTMENT_OTHER): Payer: Self-pay | Admitting: Obstetrics & Gynecology

## 2024-02-28 ENCOUNTER — Other Ambulatory Visit (HOSPITAL_BASED_OUTPATIENT_CLINIC_OR_DEPARTMENT_OTHER): Payer: Self-pay

## 2024-02-28 VITALS — BP 123/67 | HR 91 | Wt 178.0 lb

## 2024-02-28 DIAGNOSIS — Z862 Personal history of diseases of the blood and blood-forming organs and certain disorders involving the immune mechanism: Secondary | ICD-10-CM

## 2024-02-28 DIAGNOSIS — Z1332 Encounter for screening for maternal depression: Secondary | ICD-10-CM | POA: Diagnosis not present

## 2024-02-28 DIAGNOSIS — O34219 Maternal care for unspecified type scar from previous cesarean delivery: Secondary | ICD-10-CM

## 2024-02-28 DIAGNOSIS — Z3A27 27 weeks gestation of pregnancy: Secondary | ICD-10-CM

## 2024-02-28 DIAGNOSIS — Z3480 Encounter for supervision of other normal pregnancy, unspecified trimester: Secondary | ICD-10-CM

## 2024-02-28 NOTE — Progress Notes (Signed)
" ° °  PRENATAL VISIT NOTE  Subjective:  Ruth Solomon is a 23 y.o. H4E8877 at [redacted]w[redacted]d being seen today for ongoing prenatal care.  She is currently monitored for the following issues for this low-risk pregnancy and has History of thrombocytopenia; History of preterm delivery, currently pregnant, unspecified trimester; Prior CS x 2; Alpha thalassemia silent carrier; Pregnancy with history of cesarean section, antepartum; and Supervision of other normal pregnancy, antepartum on their problem list.  Patient reports no complaints.  Contemplating VBAC.  Will do consent today.  Discussed possible BTL if does have c section.  Medicaid form signed today.  Has questions about when to go out of work.  Has 8 weeks paid maternity leave.  She does have light duty. Discussed options. Contractions: Not present. Vag. Bleeding: None.  Movement: Present. Denies leaking of fluid.   The following portions of the patient's history were reviewed and updated as appropriate: allergies, current medications, past family history, past medical history, past social history, past surgical history and problem list.   Objective:   Vitals:   02/28/24 0842  BP: 123/67  Pulse: 91  Weight: 178 lb (80.7 kg)    Fetal Status: Fetal Heart Rate (bpm): 131 Fundal Height: 29 cm  Movement: Present    General:  Alert, oriented and cooperative. Patient is in no acute distress.  Skin: Skin is warm and dry. No rash noted.   Cardiovascular: Normal heart rate noted  Respiratory: Normal respiratory effort, no problems with respiration noted  Abdomen: Soft, gravid, appropriate for gestational age.  Pain/Pressure: Present (back pain and lower pelvic pain)     Pelvic: Cervical exam deferred        Extremities: Normal range of motion.  Edema: None  Mental Status: Normal mood and affect. Normal behavior. Normal judgment and thought content.   Assessment and Plan:  Pregnancy: H4E8877 at [redacted]w[redacted]d 1. Supervision of other normal pregnancy,  antepartum (Primary) - on PNV.  Not taking recommended baby ASA - follow up 4 weeks  2. [redacted] weeks gestation of pregnancy - Glucose Tolerance, 2 Hours w/1 Hour - CBC - HIV Antibody (routine testing w rflx) - RPR W/RFLX TO RPR TITER, TREPONEMAL AB, SCREEN AND DIAGNOSIS  3. History of thrombocytopenia - will check plt level today with CBC.  Has seen Dr. Federico in the past.  Will reach out if lower.  4. Prior CS x 2 - pt considering VBAC.  Consent reviewed and signed today.  Preterm labor symptoms and general obstetric precautions including but not limited to vaginal bleeding, contractions, leaking of fluid and fetal movement were reviewed in detail with the patient. Please refer to After Visit Summary for other counseling recommendations.   Return in about 4 weeks (around 03/27/2024).  Future Appointments  Date Time Provider Department Center  03/11/2024  9:35 AM Cleotilde Ronal RAMAN, MD [facility] [facility]  03/26/2024 10:35 AM Delores Nidia CROME, FNP [facility]  04/08/2024  9:35 AM Cleotilde Ronal RAMAN, MD [facility]  04/24/2024  3:25 PM Cleotilde Ronal RAMAN, MD [facility]  05/02/2024  2:35 PM Cleotilde Ronal RAMAN, MD [facility]  05/07/2024 11:15 AM Delores Nidia CROME, FNP [facility]  05/16/2024  2:55 PM Cleotilde Ronal RAMAN, MD [facility]  05/22/2024  2:15 PM Cleotilde Ronal RAMAN, MD [facility] [phone] [facility]    Ronal RAMAN Cleotilde, MD  "

## 2024-02-29 LAB — CBC
Hematocrit: 38.8 % (ref 34.0–46.6)
Hemoglobin: 12.1 g/dL (ref 11.1–15.9)
MCH: 26.6 pg (ref 26.6–33.0)
MCHC: 31.2 g/dL — ABNORMAL LOW (ref 31.5–35.7)
MCV: 85 fL (ref 79–97)
Platelets: 95 x10E3/uL — CL (ref 150–450)
RBC: 4.55 x10E6/uL (ref 3.77–5.28)
RDW: 13.2 % (ref 11.7–15.4)
WBC: 10.3 x10E3/uL (ref 3.4–10.8)

## 2024-02-29 LAB — HIV ANTIBODY (ROUTINE TESTING W REFLEX): HIV Screen 4th Generation wRfx: NONREACTIVE

## 2024-02-29 LAB — GLUCOSE TOLERANCE, 2 HOURS W/ 1HR
Glucose, 1 hour: 133 mg/dL (ref 70–179)
Glucose, 2 hour: 97 mg/dL (ref 70–152)
Glucose, Fasting: 69 mg/dL — ABNORMAL LOW (ref 70–91)

## 2024-02-29 LAB — SYPHILIS: RPR W/REFLEX TO RPR TITER AND TREPONEMAL ANTIBODIES, TRADITIONAL SCREENING AND DIAGNOSIS ALGORITHM: RPR Ser Ql: NONREACTIVE

## 2024-03-01 ENCOUNTER — Ambulatory Visit (HOSPITAL_BASED_OUTPATIENT_CLINIC_OR_DEPARTMENT_OTHER): Payer: Self-pay | Admitting: Obstetrics & Gynecology

## 2024-03-02 ENCOUNTER — Encounter (HOSPITAL_BASED_OUTPATIENT_CLINIC_OR_DEPARTMENT_OTHER): Payer: Self-pay | Admitting: Obstetrics & Gynecology

## 2024-03-11 ENCOUNTER — Ambulatory Visit (INDEPENDENT_AMBULATORY_CARE_PROVIDER_SITE_OTHER): Payer: Self-pay | Admitting: Obstetrics & Gynecology

## 2024-03-11 VITALS — BP 127/71 | HR 90 | Wt 185.0 lb

## 2024-03-11 DIAGNOSIS — O99891 Other specified diseases and conditions complicating pregnancy: Secondary | ICD-10-CM | POA: Diagnosis not present

## 2024-03-11 DIAGNOSIS — Z862 Personal history of diseases of the blood and blood-forming organs and certain disorders involving the immune mechanism: Secondary | ICD-10-CM | POA: Diagnosis not present

## 2024-03-11 DIAGNOSIS — O34219 Maternal care for unspecified type scar from previous cesarean delivery: Secondary | ICD-10-CM

## 2024-03-11 DIAGNOSIS — O99113 Other diseases of the blood and blood-forming organs and certain disorders involving the immune mechanism complicating pregnancy, third trimester: Secondary | ICD-10-CM | POA: Diagnosis not present

## 2024-03-11 DIAGNOSIS — R21 Rash and other nonspecific skin eruption: Secondary | ICD-10-CM | POA: Diagnosis not present

## 2024-03-11 DIAGNOSIS — Z3A28 28 weeks gestation of pregnancy: Secondary | ICD-10-CM

## 2024-03-11 DIAGNOSIS — O09899 Supervision of other high risk pregnancies, unspecified trimester: Secondary | ICD-10-CM

## 2024-03-11 DIAGNOSIS — O09213 Supervision of pregnancy with history of pre-term labor, third trimester: Secondary | ICD-10-CM

## 2024-03-11 DIAGNOSIS — Z3480 Encounter for supervision of other normal pregnancy, unspecified trimester: Secondary | ICD-10-CM

## 2024-03-11 DIAGNOSIS — D563 Thalassemia minor: Secondary | ICD-10-CM | POA: Diagnosis not present

## 2024-03-11 DIAGNOSIS — O09893 Supervision of other high risk pregnancies, third trimester: Secondary | ICD-10-CM

## 2024-03-11 MED ORDER — HYDROCORTISONE ACETATE 2.5 % EX CREA: TOPICAL_CREAM | CUTANEOUS | 1 refills | Status: DC

## 2024-03-11 NOTE — Progress Notes (Unsigned)
 Patient reports increased itchiness of left foot. No swelling. Patient would like to discuss aspirin  today.

## 2024-03-14 NOTE — Progress Notes (Signed)
 "  PRENATAL VISIT NOTE  Subjective:  Ruth Solomon is a 24 y.o. H4E8877 at [redacted]w[redacted]d being seen today for ongoing prenatal care.  She is currently monitored for the following issues for this low-risk pregnancy and has History of thrombocytopenia; History of preterm delivery, currently pregnant, unspecified trimester; Prior CS x 2; Alpha thalassemia silent carrier; Pregnancy with history of cesarean section, antepartum; and Supervision of other normal pregnancy, antepartum on their problem list.  Patient reports rash on her foot that is itchy.  Denies any swelling. Hasn't started baby ASA.  Wants to discuss today.  Reasoning for recommendation discussed but advised minimal benefit noted in study if started after 28 week.  Pt will probably not start this.  Contractions: Not present. Vag. Bleeding: None.  Movement: Present. Denies leaking of fluid.   The following portions of the patient's history were reviewed and updated as appropriate: allergies, current medications, past family history, past medical history, past social history, past surgical history and problem list.   Objective:   Vitals:   03/11/24 0945  BP: 127/71  Pulse: 90  Weight: 185 lb (83.9 kg)    Fetal Status:  Fetal Heart Rate (bpm): 140 Fundal Height: 29 cm Movement: Present    General: Alert, oriented and cooperative. Patient is in no acute distress.  Skin: Skin is warm and dry. No rash noted.   Cardiovascular: Normal heart rate noted  Respiratory: Normal respiratory effort, no problems with respiration noted  Abdomen: Soft, gravid, appropriate for gestational age.  Pain/Pressure: Present     Pelvic: Cervical exam deferred        Extremities: Normal range of motion.  Edema: None  Mental Status: Normal mood and affect. Normal behavior. Normal judgment and thought content.      02/28/2024    8:46 AM 01/01/2024    5:07 PM 12/06/2023   11:09 AM  Depression screen PHQ 2/9  Decreased Interest 0 1 1  Down, Depressed,  Hopeless 0 0 0  PHQ - 2 Score 0 1 1  Altered sleeping 0 0   Tired, decreased energy 0 2   Change in appetite 0 0   Feeling bad or failure about yourself  0 0   Trouble concentrating 0 0   Moving slowly or fidgety/restless 0 0   Suicidal thoughts 0 0   PHQ-9 Score 0 3    Difficult doing work/chores  Somewhat difficult      Data saved with a previous flowsheet row definition        02/28/2024    8:47 AM 01/01/2024    5:07 PM 12/06/2023   11:09 AM 09/27/2023    8:31 AM  GAD 7 : Generalized Anxiety Score  Nervous, Anxious, on Edge 0 0 0 0  Control/stop worrying 1 0 0 0  Worry too much - different things 0 0 0 0  Trouble relaxing 0 0 0 0  Restless 0 0 0 0  Easily annoyed or irritable 1 1 1  0  Afraid - awful might happen 0 0 0 0  Total GAD 7 Score 2 1 1  0  Anxiety Difficulty  Somewhat difficult Somewhat difficult Not difficult at all    Assessment and Plan:  Pregnancy: H4E8877 at [redacted]w[redacted]d 1. Supervision of other normal pregnancy, antepartum (Primary) - on PNV - recheck 2 weeks  2. Prior CS x 2 - would like to consider VBAC.  Consent signed with last appt.  Also, BTL papers signed as well  3. History of thrombocytopenia - platelets  were 95K on 12/31.  Have reached out to Dr. Federico.  Will continue to check monthly.  He recommended consult if <80K.  4. Pregnancy with history of cesarean section, antepartum  5. History of preterm delivery, currently pregnant, unspecified trimester  6. Alpha thalassemia silent carrier - FOB testing offered  7. Rash of foot - rash is focal and only on foot.  Will treat with topical steroid.  Pt to call if worsens - Hydrocortisone  Acetate 2.5 % CREA; Apply blueberry size amount cream to rash twice daily for up to 10 days  Dispense: 30 g; Refill: 1  Preterm labor symptoms and general obstetric precautions including but not limited to vaginal bleeding, contractions, leaking of fluid and fetal movement were reviewed in detail with the  patient. Please refer to After Visit Summary for other counseling recommendations.   Return in about 2 weeks (around 03/25/2024).  Future Appointments  Date Time Provider Department Center  03/26/2024 10:35 AM Delores Nidia CROME, FNP [facility]  04/08/2024  9:35 AM Cleotilde Ronal RAMAN, MD [facility]  04/24/2024  3:25 PM Cleotilde Ronal RAMAN, MD [facility]  05/02/2024  2:35 PM Cleotilde Ronal RAMAN, MD [facility]  05/07/2024 11:15 AM Delores Nidia CROME, FNP [facility]  05/16/2024  2:55 PM Cleotilde Ronal RAMAN, MD [facility]  05/22/2024  2:15 PM Cleotilde Ronal RAMAN, MD [facility] [phone] [facility]    Ronal RAMAN Cleotilde, MD  "

## 2024-03-22 ENCOUNTER — Other Ambulatory Visit (HOSPITAL_BASED_OUTPATIENT_CLINIC_OR_DEPARTMENT_OTHER): Payer: Self-pay

## 2024-03-22 DIAGNOSIS — R21 Rash and other nonspecific skin eruption: Secondary | ICD-10-CM

## 2024-03-22 MED ORDER — HYDROCORTISONE ACETATE 2.5 % EX CREA: TOPICAL_CREAM | CUTANEOUS | 1 refills | Status: AC

## 2024-03-25 ENCOUNTER — Encounter (HOSPITAL_BASED_OUTPATIENT_CLINIC_OR_DEPARTMENT_OTHER): Payer: Self-pay | Admitting: Certified Nurse Midwife

## 2024-03-26 ENCOUNTER — Encounter (HOSPITAL_BASED_OUTPATIENT_CLINIC_OR_DEPARTMENT_OTHER): Admitting: Obstetrics and Gynecology

## 2024-03-26 DIAGNOSIS — Z0289 Encounter for other administrative examinations: Secondary | ICD-10-CM

## 2024-03-28 ENCOUNTER — Ambulatory Visit (INDEPENDENT_AMBULATORY_CARE_PROVIDER_SITE_OTHER): Admitting: Obstetrics and Gynecology

## 2024-03-28 VITALS — BP 103/66 | HR 80 | Wt 188.0 lb

## 2024-03-28 DIAGNOSIS — R12 Heartburn: Secondary | ICD-10-CM | POA: Diagnosis not present

## 2024-03-28 DIAGNOSIS — Z3480 Encounter for supervision of other normal pregnancy, unspecified trimester: Secondary | ICD-10-CM

## 2024-03-28 DIAGNOSIS — O09899 Supervision of other high risk pregnancies, unspecified trimester: Secondary | ICD-10-CM

## 2024-03-28 DIAGNOSIS — Z3A31 31 weeks gestation of pregnancy: Secondary | ICD-10-CM

## 2024-03-28 DIAGNOSIS — Z862 Personal history of diseases of the blood and blood-forming organs and certain disorders involving the immune mechanism: Secondary | ICD-10-CM

## 2024-03-28 DIAGNOSIS — O09893 Supervision of other high risk pregnancies, third trimester: Secondary | ICD-10-CM

## 2024-03-28 DIAGNOSIS — O09213 Supervision of pregnancy with history of pre-term labor, third trimester: Secondary | ICD-10-CM | POA: Diagnosis not present

## 2024-03-28 DIAGNOSIS — O26893 Other specified pregnancy related conditions, third trimester: Secondary | ICD-10-CM

## 2024-03-28 DIAGNOSIS — O34219 Maternal care for unspecified type scar from previous cesarean delivery: Secondary | ICD-10-CM | POA: Diagnosis not present

## 2024-03-28 LAB — CBC
Hematocrit: 35.2 % (ref 34.0–46.6)
Hemoglobin: 11.5 g/dL (ref 11.1–15.9)
MCH: 26.4 pg — ABNORMAL LOW (ref 26.6–33.0)
MCHC: 32.7 g/dL (ref 31.5–35.7)
MCV: 81 fL (ref 79–97)
Platelets: 91 10*3/uL — CL (ref 150–450)
RBC: 4.35 x10E6/uL (ref 3.77–5.28)
RDW: 12.4 % (ref 11.7–15.4)
WBC: 9.7 10*3/uL (ref 3.4–10.8)

## 2024-03-28 MED ORDER — FAMOTIDINE 20 MG PO TABS: 20.0000 mg | ORAL_TABLET | Freq: Every day | ORAL | 1 refills | Status: AC

## 2024-03-28 NOTE — Progress Notes (Signed)
" ° °  PRENATAL VISIT NOTE  Subjective:  Ruth Solomon is a 24 y.o. H4E8877 at [redacted]w[redacted]d being seen today for ongoing prenatal care.  She is currently monitored for the following issues for this low-risk pregnancy and has History of thrombocytopenia; History of preterm delivery, currently pregnant, unspecified trimester; Prior CS x 2; Alpha thalassemia silent carrier; Pregnancy with history of cesarean section, antepartum; and Supervision of other normal pregnancy, antepartum on their problem list.  Patient reports heartburn, has not tried anything .  Contractions: Irregular. Vag. Bleeding: None.  Movement: Present. Denies leaking of fluid.   The following portions of the patient's history were reviewed and updated as appropriate: allergies, current medications, past family history, past medical history, past social history, past surgical history and problem list.   Objective:   Vitals:   03/28/24 1359  BP: 103/66  Pulse: 80  Weight: 188 lb (85.3 kg)    Fetal Status:  Fetal Heart Rate (bpm): 138 Fundal Height: 31 cm Movement: Present    General: Alert, oriented and cooperative. Patient is in no acute distress.  Skin: Skin is warm and dry. No rash noted.   Cardiovascular: Normal heart rate noted  Respiratory: Normal respiratory effort, no problems with respiration noted  Abdomen: Soft, gravid, appropriate for gestational age.  Pain/Pressure: Present     Pelvic: Cervical exam deferred        Extremities: Normal range of motion.  Edema: None  Mental Status: Normal mood and affect. Normal behavior. Normal judgment and thought content.   Assessment and Plan:  Pregnancy: H4E8877 at [redacted]w[redacted]d 1. Supervision of other normal pregnancy, antepartum (Primary) BP and FHR normal Doing well, feeling regular movement    2. [redacted] weeks gestation of pregnancy Declined tdap   3. Prior CS x 2 [place] papers signed 12/31 Still considering BTL, briefly discussed other options   4. History of  thrombocytopenia 95k 12/31, rechecking today   5. History of preterm delivery, currently pregnant, unspecified trimester No s&s labor, discussed signs to go to hospital  6. Heartburn during pregnancy in third trimester Continue sitting up for 20-30 min after meal, limit heavy/greasy/ spicy/ acidic foods Trial pepcid  bedtime - famotidine  (PEPCID ) 20 MG tablet; Take 1 tablet (20 mg total) by mouth at bedtime.  Dispense: 30 tablet; Refill: 1  Preterm labor symptoms and general obstetric precautions including but not limited to vaginal bleeding, contractions, leaking of fluid and fetal movement were reviewed in detail with the patient. Please refer to After Visit Summary for other counseling recommendations.   Return in two weeks for OB visit   Future Appointments  Date Time Provider Department Center  04/08/2024  9:35 AM Cleotilde Ronal RAMAN, MD [facility]  04/24/2024  3:25 PM Cleotilde Ronal RAMAN, MD [facility] [facility]  05/02/2024  2:35 PM Cleotilde Ronal RAMAN, MD [facility]  05/07/2024 11:15 AM Delores Nidia CROME, FNP [facility]  05/16/2024  2:55 PM Cleotilde Ronal RAMAN, MD [facility] [facility]  05/22/2024  2:15 PM Cleotilde Ronal RAMAN, MD [facility] [phone] [facility]    Nidia Delores, FNP "

## 2024-03-29 ENCOUNTER — Ambulatory Visit: Payer: Self-pay | Admitting: Obstetrics and Gynecology

## 2024-04-08 ENCOUNTER — Encounter (HOSPITAL_BASED_OUTPATIENT_CLINIC_OR_DEPARTMENT_OTHER): Payer: Self-pay | Admitting: Obstetrics & Gynecology

## 2024-04-22 ENCOUNTER — Encounter (HOSPITAL_BASED_OUTPATIENT_CLINIC_OR_DEPARTMENT_OTHER): Payer: Self-pay | Admitting: Certified Nurse Midwife

## 2024-04-24 ENCOUNTER — Encounter (HOSPITAL_BASED_OUTPATIENT_CLINIC_OR_DEPARTMENT_OTHER): Admitting: Obstetrics & Gynecology

## 2024-05-02 ENCOUNTER — Encounter (HOSPITAL_BASED_OUTPATIENT_CLINIC_OR_DEPARTMENT_OTHER): Payer: Self-pay | Admitting: Obstetrics & Gynecology

## 2024-05-07 ENCOUNTER — Encounter (HOSPITAL_BASED_OUTPATIENT_CLINIC_OR_DEPARTMENT_OTHER): Payer: Self-pay | Admitting: Obstetrics and Gynecology

## 2024-05-16 ENCOUNTER — Encounter (HOSPITAL_BASED_OUTPATIENT_CLINIC_OR_DEPARTMENT_OTHER): Payer: Self-pay | Admitting: Obstetrics & Gynecology

## 2024-05-22 ENCOUNTER — Encounter (HOSPITAL_BASED_OUTPATIENT_CLINIC_OR_DEPARTMENT_OTHER): Payer: Self-pay | Admitting: Obstetrics & Gynecology
